# Patient Record
Sex: Female | Born: 1980
Health system: Southern US, Community
[De-identification: ages and names within clinical notes are randomized; demographics above are authoritative.]

## PROBLEM LIST (undated history)

## (undated) DIAGNOSIS — R7303 Prediabetes: Secondary | ICD-10-CM

## (undated) DIAGNOSIS — F410 Panic disorder [episodic paroxysmal anxiety] without agoraphobia: Secondary | ICD-10-CM

## (undated) DIAGNOSIS — F32A Depression, unspecified: Secondary | ICD-10-CM

## (undated) DIAGNOSIS — N809 Endometriosis, unspecified: Secondary | ICD-10-CM

## (undated) DIAGNOSIS — K219 Gastro-esophageal reflux disease without esophagitis: Secondary | ICD-10-CM

## (undated) DIAGNOSIS — R87619 Unspecified abnormal cytological findings in specimens from cervix uteri: Secondary | ICD-10-CM

## (undated) DIAGNOSIS — K55059 Acute (reversible) ischemia of intestine, part and extent unspecified: Principal | ICD-10-CM

## (undated) DIAGNOSIS — G43909 Migraine, unspecified, not intractable, without status migrainosus: Secondary | ICD-10-CM

## (undated) DIAGNOSIS — E7800 Pure hypercholesterolemia, unspecified: Secondary | ICD-10-CM

## (undated) DIAGNOSIS — G4733 Obstructive sleep apnea (adult) (pediatric): Principal | ICD-10-CM

## (undated) DIAGNOSIS — O24419 Gestational diabetes mellitus in pregnancy, unspecified control: Secondary | ICD-10-CM

## (undated) DIAGNOSIS — E282 Polycystic ovarian syndrome: Secondary | ICD-10-CM

## (undated) DIAGNOSIS — F329 Major depressive disorder, single episode, unspecified: Secondary | ICD-10-CM

## (undated) HISTORY — DX: Gestational diabetes mellitus in pregnancy, unspecified control: O24.419

## (undated) HISTORY — PX: WISDOM TOOTH EXTRACTION: SHX21

## (undated) HISTORY — PX: KNEE SURGERY: SHX244

## (undated) HISTORY — DX: Unspecified abnormal cytological findings in specimens from cervix uteri: R87.619

## (undated) HISTORY — DX: Acute (reversible) ischemia of intestine, part and extent unspecified: K55.059

## (undated) HISTORY — PX: LEEP: SHX91

## (undated) HISTORY — DX: Obstructive sleep apnea (adult) (pediatric): G47.33

## (undated) HISTORY — PX: TONSILLECTOMY: SUR1361

---
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
## 2001-01-30 ENCOUNTER — Other Ambulatory Visit: Admission: RE | Admit: 2001-01-30 | Discharge: 2001-01-30 | Payer: Self-pay | Admitting: Obstetrics and Gynecology

## 2002-08-17 ENCOUNTER — Other Ambulatory Visit: Admission: RE | Admit: 2002-08-17 | Discharge: 2002-08-17 | Payer: Self-pay | Admitting: Obstetrics and Gynecology

## 2003-06-26 DIAGNOSIS — R87619 Unspecified abnormal cytological findings in specimens from cervix uteri: Secondary | ICD-10-CM

## 2003-06-26 HISTORY — DX: Unspecified abnormal cytological findings in specimens from cervix uteri: R87.619

## 2004-08-08 ENCOUNTER — Other Ambulatory Visit: Admission: RE | Admit: 2004-08-08 | Discharge: 2004-08-08 | Payer: Self-pay | Admitting: Obstetrics and Gynecology

## 2004-10-12 ENCOUNTER — Other Ambulatory Visit: Admission: RE | Admit: 2004-10-12 | Discharge: 2004-10-12 | Payer: Self-pay | Admitting: Obstetrics and Gynecology

## 2005-10-18 ENCOUNTER — Other Ambulatory Visit: Admission: RE | Admit: 2005-10-18 | Discharge: 2005-10-18 | Payer: Self-pay | Admitting: Obstetrics and Gynecology

## 2010-04-05 ENCOUNTER — Ambulatory Visit: Payer: Self-pay | Admitting: Family Medicine

## 2010-04-05 DIAGNOSIS — S01502A Unspecified open wound of oral cavity, initial encounter: Secondary | ICD-10-CM | POA: Insufficient documentation

## 2010-04-05 DIAGNOSIS — K122 Cellulitis and abscess of mouth: Secondary | ICD-10-CM | POA: Insufficient documentation

## 2010-06-21 ENCOUNTER — Encounter: Payer: Self-pay | Admitting: Family Medicine

## 2010-07-25 NOTE — Assessment & Plan Note (Signed)
Summary: SWELLING ALONG JAWLINE LEFT SIDE/TJ   Vital Signs:  Patient Profile:   30 Years Old Female CC:      ?burn to left side/insidemouth, left cheek swelling Height:     69 inches Weight:      269 pounds O2 Sat:      99 % O2 treatment:    Room Air Temp:     99.6 degrees F oral Pulse rate:   92 / minute Resp:     16 per minute BP sitting:   117 / 79  (left arm) Cuff size:   large  Pt. in pain?   no  Vitals Entered By: Lajean Saver RN (April 05, 2010 2:56 PM)                   Prior Medication List:  No prior medications documented  Updated Prior Medication List: IBUPROFEN 400 MG TABS (IBUPROFEN)   Current Allergies: No known allergies History of Present Illness Chief Complaint: ?burn to left side/insidemouth, left cheek swelling History of Present Illness: She ate something hot about 7-10 days ago. Since then mouth has ben irritated and swelling onthe L side of mouth and face w/pain.  Current Problems: CELLULITIS/ABSCESS, MOUTH (ICD-528.3) OPEN WOUND OF MOUTH UNSPECIFIED SITE COMPLICATED (ICD-873.70)   REVIEW OF SYSTEMS Constitutional Symptoms      Denies fever, chills, night sweats, weight loss, weight gain, and fatigue.  Eyes       Denies change in vision, eye pain, eye discharge, glasses, contact lenses, and eye surgery. Ear/Nose/Throat/Mouth       Denies hearing loss/aids, change in hearing, ear pain, ear discharge, dizziness, frequent runny nose, frequent nose bleeds, sinus problems, sore throat, hoarseness, and tooth pain or bleeding.  Respiratory       Denies dry cough, productive cough, wheezing, shortness of breath, asthma, bronchitis, and emphysema/COPD.  Cardiovascular       Denies murmurs, chest pain, and tires easily with exhertion.    Gastrointestinal       Complains of diarrhea.      Denies stomach pain, nausea/vomiting, constipation, blood in bowel movements, and indigestion. Genitourniary       Denies painful urination, kidney stones,  and loss of urinary control. Neurological       Denies paralysis, seizures, and fainting/blackouts. Musculoskeletal       Complains of swelling.      Denies muscle pain, joint pain, joint stiffness, decreased range of motion, redness, muscle weakness, and gout.      Comments: left cheek Skin       Denies bruising, unusual mles/lumps or sores, and hair/skin or nail changes.  Psych       Denies mood changes, temper/anger issues, anxiety/stress, speech problems, depression, and sleep problems. Other Comments: Patient says she bit into something that was very hot about 3-4 days ago. Since she has developed "soreness with white spots" on the left inner cheek. She has also developed facial swelling on the left side.   Past History:  Social History: Last updated: 04/05/2010 Current Smoker 1/2 PPD Alcohol use-no Drug use-no  Risk Factors: Smoking Status: current (04/05/2010)  Past Medical History: Unremarkable  Past Surgical History: Tonsillectomy Bilateral knee arthroscopy  Family History: Reviewed history and no changes required.  Social History: Reviewed history and no changes required. Current Smoker 1/2 PPD Alcohol use-no Drug use-no Smoking Status:  current Drug Use:  no Physical Exam General appearance: normacephalic Skin: no obvious rashes or lesions MSE: oriented to time, place, and  person Assessment New Problems: CELLULITIS/ABSCESS, MOUTH (ICD-528.3) OPEN WOUND OF MOUTH UNSPECIFIED SITE COMPLICATED (ICD-873.70)  oral open wound    cellulitis  Plan New Medications/Changes: HYDROCODONE-ACETAMINOPHEN 5-325 MG TABS (HYDROCODONE-ACETAMINOPHEN) sig 1 by mouth q6-8hrs prn  #20 x 0, 04/05/2010, Hassan Rowan MD AUGMENTIN [phone] MG TABS (AMOXICILLIN-POT CLAVULANATE) 1 by mouth 2 times daily  #20 x 0, 04/05/2010, Hassan Rowan MD  New Orders: New Patient Level III [99203] Rocephin  250mg  [J0696] Ketorolac-Toradol 15mg  [J1885] Admin of Therapeutic Inj  intramuscular or  subcutaneous [96372] Planning Comments:   follow up[ w/oral surgeon if not better by Friday  Follow Up: Follow up in 1-2 days if no improvement  The patient and/or caregiver has been counseled thoroughly with regard to medications prescribed including dosage, schedule, interactions, rationale for use, and possible side effects and they verbalize understanding.  Diagnoses and expected course of recovery discussed and will return if not improved as expected or if the condition worsens. Patient and/or caregiver verbalized understanding.  Prescriptions: HYDROCODONE-ACETAMINOPHEN 5-325 MG TABS (HYDROCODONE-ACETAMINOPHEN) sig 1 by mouth q6-8hrs prn  #20 x 0   Entered and Authorized by:   Hassan Rowan MD   Signed by:   Hassan Rowan MD on 04/05/2010   Method used:   Printed then faxed to ...       [facility]* (retail)       [address] [place], [place]  [address]       Ph: [phone]       Fax: [phone]   RxID:   [id number] AUGMENTIN 875-125 MG TABS (AMOXICILLIN-POT CLAVULANATE) 1 by mouth 2 times daily  #20 x 0   Entered and Authorized by:   Hassan Rowan MD   Signed by:   Hassan Rowan MD on 04/05/2010   Method used:   Printed then faxed to ...       [facility]* (retail)       [address] [place], [place]  [address]       Ph: [phone]       Fax: [phone]   RxID:   [id number]   Patient Instructions: 1)  Please schedule a follow-up appointment as needed. 2)  Please schedule an appointment with your primary doctor in 2-3 days if not better or oral surgeon  3)  Tobacco is very bad for your health and your loved ones! You Should stop smoking!. 4)  Stop Smoking Tips: Choose a Quit date. Cut down before the Quit date. decide what you will do as a substitute when you feel the urge to smoke(gum,toothpick,exercise). 5)  Take your antibiotic as prescribed until ALL of it is gone, but stop if you develop a rash or swelling and contact our office as soon as  possible.  Medication Administration  Injection # 1:    Medication: Rocephin  250mg     Diagnosis: CELLULITIS/ABSCESS, MOUTH (ICD-528.3)    Route: IM    Site: RUOQ gluteus    Exp Date: 11/22/2012    Lot #: [id number]    Mfr: Sandoz    Patient tolerated injection without complications    Given by: Lajean Saver RN (April 05, 2010 4:13 PM)  Injection # 2:    Medication: Ketorolac-Toradol 15mg     Diagnosis: CELLULITIS/ABSCESS, MOUTH (ICD-528.3)    Route: IM    Site: LUOQ gluteus    Exp Date: 09/24/2011    Lot #: [id number]    Mfr:  Hospira    Comments: 30mg  given    Patient tolerated injection without complications    Given by: Lajean Saver RN (April 05, 2010 4:14 PM)  Orders Added: 1)  New Patient Level III [99203] 2)  Rocephin  250mg  [J0696] 3)  Ketorolac-Toradol 15mg  [J1885] 4)  Admin of Therapeutic Inj  intramuscular or subcutaneous [91478]

## 2010-07-27 NOTE — Letter (Signed)
Summary: RELEASE OF RECORDS  RELEASE OF RECORDS   Imported By: Dannette Barbara 06/21/2010 08:59:35  _____________________________________________________________________  External Attachment:    Type:   Image     Comment:   External Document

## 2010-11-22 ENCOUNTER — Encounter: Payer: Self-pay | Admitting: Emergency Medicine

## 2010-11-22 ENCOUNTER — Inpatient Hospital Stay (INDEPENDENT_AMBULATORY_CARE_PROVIDER_SITE_OTHER)
Admission: RE | Admit: 2010-11-22 | Discharge: 2010-11-22 | Disposition: A | Discharge status: Home / Self Care | Payer: Self-pay | Source: Ambulatory Visit | Attending: Emergency Medicine | Admitting: Emergency Medicine

## 2010-11-22 DIAGNOSIS — R05 Cough: Secondary | ICD-10-CM

## 2010-11-22 DIAGNOSIS — R059 Cough, unspecified: Secondary | ICD-10-CM

## 2010-11-22 DIAGNOSIS — J069 Acute upper respiratory infection, unspecified: Secondary | ICD-10-CM

## 2011-05-28 NOTE — Progress Notes (Signed)
Summary: SWOLLEN GLANDS,COUGH SORE THROAT/TJ Room 5   Vital Signs:  Patient Profile:   30 Years Old Female CC:      Congestion, sore throat, cough x 2 days Height:     69 inches Weight:      287 pounds O2 Sat:      97 % O2 treatment:    Room Air Temp:     98.5 degrees F oral Pulse rate:   88 / minute Pulse rhythm:   regular Resp:     18 per minute BP sitting:   134 / 84  (left arm) Cuff size:   large  Vitals Entered By: Emilio Math (Nov 22, 2010 7:41 PM)                  Current Allergies: No known allergies History of Present Illness History from: patient Chief Complaint: Congestion, sore throat, cough x 2 days History of Present Illness: 30 Years Old Female complains of onset of cold symptoms for 2 days.  Anayia has been using no OTC meds. +/- sore throat + cough No pleuritic pain No wheezing + nasal congestion + post-nasal drainage + sinus pain/pressure No chest congestion No itchy/red eyes No earache No hemoptysis No SOB + chills/sweats No fever No nausea No vomiting No abdominal pain No diarrhea No skin rashes No fatigue No myalgias No headache   REVIEW OF SYSTEMS Constitutional Symptoms      Denies fever, chills, night sweats, weight loss, weight gain, and fatigue.  Eyes       Denies change in vision, eye pain, eye discharge, glasses, contact lenses, and eye surgery. Ear/Nose/Throat/Mouth       Complains of sinus problems and sore throat.      Denies hearing loss/aids, change in hearing, ear pain, ear discharge, dizziness, frequent runny nose, frequent nose bleeds, hoarseness, and tooth pain or bleeding.  Respiratory       Complains of dry cough.      Denies productive cough, wheezing, shortness of breath, asthma, bronchitis, and emphysema/COPD.  Cardiovascular       Denies murmurs, chest pain, and tires easily with exhertion.    Gastrointestinal       Denies stomach pain, nausea/vomiting, diarrhea, constipation, blood in bowel movements,  and indigestion. Genitourniary       Denies painful urination, kidney stones, and loss of urinary control. Neurological       Denies paralysis, seizures, and fainting/blackouts. Musculoskeletal       Denies muscle pain, joint pain, joint stiffness, decreased range of motion, redness, swelling, muscle weakness, and gout.  Skin       Denies bruising, unusual mles/lumps or sores, and hair/skin or nail changes.  Psych       Denies mood changes, temper/anger issues, anxiety/stress, speech problems, depression, and sleep problems.  Past History:  Past Medical History: Reviewed history from 04/05/2010 and no changes required. Unremarkable  Past Surgical History: Reviewed history from 04/05/2010 and no changes required. 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  Plan New Medications/Changes: CHERATUSSIN AC 100-10 MG/5ML SYRP (GUAIFENESIN-CODEINE) 5cc q4-6 hrs as needed for cough  #5oz x 0, 11/22/2010, Hoyt Koch MD ZITHROMAX Z-PAK 250 MG TABS (AZITHROMYCIN) use as directed  #1 x 0, 11/22/2010, Hoyt Koch MD  New Orders: Est. Patient Level IV [14782] Pulse Oximetry (single measurment) [94760] Planning Comments:   1)  Take the prescribed antibiotic as instructed.  Likely viral URI so will probably take another 7 days to get  better no matter what treatment. 2)  Use nasal saline solution (over the counter) at least 3 times a day. 3)  Use over the counter decongestants like Zyrtec-D every 12 hours as needed to help with congestion. 4)  Can take tylenol every 6 hours or motrin every 8 hours for pain or fever. 5)  Follow up with your primary doctor  if no improvement in 5-7 days, sooner if increasing pain, fever, or new symptoms.    The patient and/or caregiver has been counseled thoroughly with regard to medications prescribed including dosage, schedule, interactions, rationale for use, and possible side effects and they verbalize understanding.  Diagnoses and expected course of recovery discussed and will return if not improved as expected or if the condition worsens. Patient and/or caregiver verbalized understanding.  Prescriptions: CHERATUSSIN AC 100-10 MG/5ML SYRP (GUAIFENESIN-CODEINE) 5cc q4-6 hrs as needed for cough  #5oz x 0   Entered and Authorized by:   Hoyt Koch MD   Signed by:   Hoyt Koch MD on 11/22/2010   Method used:   Print then Give to Patient   RxID:   [id number] ZITHROMAX Z-PAK 250 MG TABS (AZITHROMYCIN) use as directed  #1 x 0   Entered and Authorized by:   Hoyt Koch MD   Signed by:   Hoyt Koch MD on 11/22/2010   Method used:   Print then Give to Patient   RxID:   [id number]   Orders Added: 1)  Est. Patient Level IV [25366] 2)  Pulse Oximetry (single measurment) [44034]

## 2011-10-17 ENCOUNTER — Emergency Department
Admission: EM | Admit: 2011-10-17 | Discharge: 2011-10-17 | Disposition: A | Discharge status: Home / Self Care | Payer: Self-pay | Source: Home / Self Care | Attending: Family Medicine | Admitting: Family Medicine

## 2011-10-17 ENCOUNTER — Encounter: Payer: Self-pay | Admitting: Emergency Medicine

## 2011-10-17 ENCOUNTER — Emergency Department
Admit: 2011-10-17 | Discharge: 2011-10-17 | Disposition: A | Discharge status: Home / Self Care | Payer: Self-pay | Attending: Family Medicine | Admitting: Family Medicine

## 2011-10-17 DIAGNOSIS — J029 Acute pharyngitis, unspecified: Secondary | ICD-10-CM

## 2011-10-17 DIAGNOSIS — R079 Chest pain, unspecified: Secondary | ICD-10-CM

## 2011-10-17 DIAGNOSIS — R0781 Pleurodynia: Secondary | ICD-10-CM

## 2011-10-17 DIAGNOSIS — J209 Acute bronchitis, unspecified: Secondary | ICD-10-CM

## 2011-10-17 MED ORDER — CLARITHROMYCIN 500 MG PO TABS: 500.0000 mg | ORAL_TABLET | Freq: Two times a day (BID) | ORAL | Status: AC

## 2011-10-17 MED ORDER — GUAIFENESIN-CODEINE 100-10 MG/5ML PO SYRP: 10.0000 mL | ORAL_SOLUTION | Freq: Every day | ORAL | Status: AC

## 2011-10-17 NOTE — ED Provider Notes (Signed)
History     CSN: [id number]  Arrival date & time 10/17/11  1035   First MD Initiated Contact with Patient 10/17/11 1109      Chief Complaint  Patient presents with  . Cough      HPI Comments: Patient complains of mild non-productive cough for about two weeks, then over the past 3 to 4 days has had gradually progressive URI symptoms beginning with a mild sore throat (now improved), followed by progressive nasal congestion.  The cough is now worse.  Complains of fatigue but no myalgias.  Cough is now worse at night and generally non-productive during the day.  She sometimes coughs until she gags.  There has been no shortness of breath or wheezes, but over the past 3 days she has developed pleuritic pain in her right anterior/inferior chest.  She has pain with inspiration and movement.  She believes that her tetanus shot is current.  She has a past history of bronchitis and rib fracture.  She continues to smoke.  The history is provided by the patient.    History reviewed. No pertinent past medical history.  Past Surgical History  Procedure Date  . Tonsillectomy     Family History  Problem Relation Age of Onset  . Hypertension Mother   . Thyroid disease Mother   . GER disease Father     History  Substance Use Topics  . Smoking status: Current Everyday Smoker -- 1.0 packs/day for 10 years  . Smokeless tobacco: Not on file  . Alcohol Use: Yes    OB History    Grav Para Term Preterm Abortions TAB SAB Ect Mult Living                  Review of Systems + sore throat + cough + pleuritic pain on right No wheezing + nasal congestion + post-nasal drainage No sinus pain/pressure No itchy/red eyes ? earache No hemoptysis No SOB No fever/ chills No nausea No vomiting No abdominal pain No diarrhea No urinary symptoms No skin rashes + fatigue No myalgias + headache Used OTC meds without relief (Mucinex) Allergies  Review of patient's allergies indicates no known  allergies.  Home Medications   Current Outpatient Rx  Name Route Sig Dispense Refill  . DM-GUAIFENESIN ER 30-600 MG PO TB12 Oral Take 1 tablet by mouth every 12 (twelve) hours.    Marland Kitchen CLARITHROMYCIN 500 MG PO TABS Oral Take 1 tablet (500 mg total) by mouth 2 (two) times daily. 14 tablet 0  . GUAIFENESIN-CODEINE 100-10 MG/5ML PO SYRP Oral Take 10 mLs by mouth at bedtime. for cough 120 mL 0    BP 99/56  Pulse 64  Temp(Src) 98.3 F (36.8 C) (Oral)  Resp 18  Ht 5\' 9"  (1.753 m)  Wt 265 lb (120.203 kg)  BMI 39.13 kg/m2  SpO2 98%  Physical Exam Nursing notes and Vital Signs reviewed. Appearance:  Patient appears stated age, and in no acute distress.  Patient is obese (BMI 39.2) Eyes:  Pupils are equal, round, and reactive to light and accomodation.  Extraocular movement is intact.  Conjunctivae are not inflamed  Ears:  Canals normal.  Tympanic membranes normal.  Nose:  Mildly congested turbinates.  No sinus tenderness.  Pharynx:  Normal Neck:  Supple.  Tender shotty posterior nodes are palpated bilaterally  Lungs:  Clear to auscultation.  Breath sounds are equal.  Chest:  Tender right anterior/inferior ribs.  No swelling, crepitance, or ecchymosis. Heart:  Regular rate and rhythm  without murmurs, rubs, or gallops.  Abdomen:  Nontender without masses or hepatosplenomegaly.  Bowel sounds are present.  No CVA or flank tenderness.  Extremities:  No edema.  No calf tenderness Skin:  No rash present.   ED Course  Procedures  none   Labs Reviewed  POCT RAPID STREP A (OFFICE) negative   Dg Ribs Unilateral W/chest Right  10/17/2011  *RADIOLOGY REPORT*  Clinical Data: Cough.  Right-sided rib pain.  RIGHT RIBS AND CHEST - 3+ VIEW  Comparison: None.  Findings: Heart size is normal.  Mediastinal shadows are normal. The lungs are clear.  No discernible rib fractures.  Only the lower ribs were evaluated.  IMPRESSION: No active cardiopulmonary disease.  No visible rib fracture.  Original Report  Authenticated By: Thomasenia Sales, M.D.     1. Rib pain on right side   2. Acute bronchitis       MDM  Begin Biaxin, and guaifenesin/codeine at bedtime. Dispensed rib belt. Take Mucinex D (guaifenesin with decongestant) twice daily for congestion.  Increase fluid intake, rest. May use Afrin nasal spray (or generic oxymetazoline) twice daily for about 5 days.  Also recommend using saline nasal spray several times daily and saline nasal irrigation (AYR is a common brand) Stop all antihistamines for now, and other non-prescription cough/cold preparations. May take Ibuprofen 200mg , 4 tabs every 8 hours with food for chest/rib discomfort. Follow-up with family doctor if not improving 7 to 10 days.         Lattie Haw, MD 10/17/11 [phone]

## 2011-10-17 NOTE — ED Notes (Signed)
Cough, sore throat x 3 days

## 2011-10-17 NOTE — Discharge Instructions (Signed)
Take Mucinex D (guaifenesin with decongestant) twice daily for congestion.  Increase fluid intake, rest. May use Afrin nasal spray (or generic oxymetazoline) twice daily for about 5 days.  Also recommend using saline nasal spray several times daily and saline nasal irrigation (AYR is a common brand) Stop all antihistamines for now, and other non-prescription cough/cold preparations. May take Ibuprofen 200mg , 4 tabs every 8 hours with food for chest/rib discomfort. Follow-up with family doctor if not improving 7 to 10 days.

## 2012-01-02 DIAGNOSIS — F988 Other specified behavioral and emotional disorders with onset usually occurring in childhood and adolescence: Secondary | ICD-10-CM | POA: Insufficient documentation

## 2012-01-02 DIAGNOSIS — F329 Major depressive disorder, single episode, unspecified: Secondary | ICD-10-CM | POA: Insufficient documentation

## 2012-01-02 DIAGNOSIS — F419 Anxiety disorder, unspecified: Secondary | ICD-10-CM | POA: Insufficient documentation

## 2012-01-02 DIAGNOSIS — Z720 Tobacco use: Secondary | ICD-10-CM | POA: Insufficient documentation

## 2012-01-02 DIAGNOSIS — R9401 Abnormal electroencephalogram [EEG]: Secondary | ICD-10-CM | POA: Insufficient documentation

## 2012-01-04 ENCOUNTER — Emergency Department (HOSPITAL_BASED_OUTPATIENT_CLINIC_OR_DEPARTMENT_OTHER): Payer: Self-pay

## 2012-01-04 ENCOUNTER — Emergency Department (HOSPITAL_BASED_OUTPATIENT_CLINIC_OR_DEPARTMENT_OTHER)
Admission: EM | Admit: 2012-01-04 | Discharge: 2012-01-04 | Disposition: A | Discharge status: Home / Self Care | Payer: Self-pay | Attending: Emergency Medicine | Admitting: Emergency Medicine

## 2012-01-04 ENCOUNTER — Encounter (HOSPITAL_BASED_OUTPATIENT_CLINIC_OR_DEPARTMENT_OTHER): Payer: Self-pay

## 2012-01-04 DIAGNOSIS — F17200 Nicotine dependence, unspecified, uncomplicated: Secondary | ICD-10-CM | POA: Insufficient documentation

## 2012-01-04 DIAGNOSIS — F411 Generalized anxiety disorder: Secondary | ICD-10-CM | POA: Insufficient documentation

## 2012-01-04 DIAGNOSIS — I498 Other specified cardiac arrhythmias: Secondary | ICD-10-CM | POA: Insufficient documentation

## 2012-01-04 DIAGNOSIS — Z79899 Other long term (current) drug therapy: Secondary | ICD-10-CM | POA: Insufficient documentation

## 2012-01-04 DIAGNOSIS — R079 Chest pain, unspecified: Secondary | ICD-10-CM | POA: Insufficient documentation

## 2012-01-04 DIAGNOSIS — R001 Bradycardia, unspecified: Secondary | ICD-10-CM

## 2012-01-04 DIAGNOSIS — K219 Gastro-esophageal reflux disease without esophagitis: Secondary | ICD-10-CM | POA: Insufficient documentation

## 2012-01-04 HISTORY — DX: Panic disorder (episodic paroxysmal anxiety): F41.0

## 2012-01-04 HISTORY — DX: Gastro-esophageal reflux disease without esophagitis: K21.9

## 2012-01-04 HISTORY — DX: Pure hypercholesterolemia, unspecified: E78.00

## 2012-01-04 LAB — CBC WITH DIFFERENTIAL/PLATELET
Basophils Relative: 1 % (ref 0–1)
Eosinophils Absolute: 0.2 10*3/uL (ref 0.0–0.7)
Eosinophils Relative: 1 % (ref 0–5)
MCH: 31.7 pg (ref 26.0–34.0)
MCHC: 34.3 g/dL (ref 30.0–36.0)
MCV: 92.3 fL (ref 78.0–100.0)
Neutrophils Relative %: 50 % (ref 43–77)
Platelets: 250 10*3/uL (ref 150–400)

## 2012-01-04 LAB — COMPREHENSIVE METABOLIC PANEL
ALT: 16 U/L (ref 0–35)
Albumin: 3.7 g/dL (ref 3.5–5.2)
Alkaline Phosphatase: 73 U/L (ref 39–117)
BUN: 11 mg/dL (ref 6–23)
Calcium: 9.1 mg/dL (ref 8.4–10.5)
GFR calc Af Amer: >90 mL/min (ref >90)
Glucose, Bld: 86 mg/dL (ref 70–99)
Potassium: 3.8 mEq/L (ref 3.5–5.1)
Sodium: 137 mEq/L (ref 135–145)
Total Protein: 6.6 g/dL (ref 6.0–8.3)

## 2012-01-04 LAB — TROPONIN I: Troponin I: <0.3 ng/mL (ref <0.30)

## 2012-01-04 NOTE — ED Notes (Signed)
MD at bedside. 

## 2012-01-04 NOTE — ED Provider Notes (Signed)
Medical screening examination/treatment/procedure(s) were conducted as a shared visit with non-physician practitioner(s) and myself.  I personally evaluated the patient during the encounter  Atypical chest pain. No SOB. Fmhx VTE x 2 after inciting events. Dimer negative. Labs unremarkable. Sinus bradycardia on EKG without lightheadedness/dizziness. CXR unremarkable. RRR, CTAB. No EMC precluding discharge at this time. Given Precautions for return. PMD f/u.   Forbes Cellar, MD 01/04/12 2342

## 2012-01-04 NOTE — ED Notes (Signed)
Patient transported to X-ray 

## 2012-01-04 NOTE — ED Notes (Signed)
C/o CP started 7/6-states pain started after losing paycheck in [employer] and having panic attack-was seen PCP 2 days ago-dx with GERD-started on prilosec and celexa

## 2012-01-04 NOTE — ED Notes (Signed)
PA at bedside.

## 2012-01-04 NOTE — ED Provider Notes (Signed)
History     CSN: [id number]  Arrival date & time 01/04/12  1936   First MD Initiated Contact with Patient 01/04/12 2119      Chief Complaint  Patient presents with  . Chest Pain    (Consider location/radiation/quality/duration/timing/severity/associated sxs/prior treatment) Patient is a 31 y.o. female presenting with chest pain. The history is provided by the patient and a parent.  Chest Pain The chest pain began 1 - 2 weeks ago. Chest pain occurs intermittently. The chest pain is unchanged. The quality of the pain is described as sharp. The pain does not radiate. Pertinent negatives for primary symptoms include no fever, no shortness of breath, no nausea and no vomiting. Associated symptoms comments: She reports chest discomfort she describes as a pinching sensation that lasts 30 seconds to 1 minute and can occur frequently or sporadically throughout the day. She has been having pain since last week after having an anxiety attack. She has a history of anxiety and states pain of complaint is unlike those symptoms of anxiety. She has a history of GERD and reports chest discomfort is unlike symptoms of GERD. No SOB, cough, fever. No nausea, vomiting. .     Past Medical History  Diagnosis Date  . GERD (gastroesophageal reflux disease)   . Panic attack   . High cholesterol     Past Surgical History  Procedure Date  . Tonsillectomy   . Knee surgery   . Wisdom tooth extraction     Family History  Problem Relation Age of Onset  . Hypertension Mother   . Thyroid disease Mother   . GER disease Father     History  Substance Use Topics  . Smoking status: Current Everyday Smoker -- 1.0 packs/day for 10 years  . Smokeless tobacco: Not on file  . Alcohol Use: Yes    OB History    Grav Para Term Preterm Abortions TAB SAB Ect Mult Living                  Review of Systems  Constitutional: Negative for fever.  Respiratory: Negative for shortness of breath.   Cardiovascular:  Positive for chest pain. Negative for leg swelling.  Gastrointestinal: Negative for nausea and vomiting.    Allergies  Wellbutrin  Home Medications   Current Outpatient Rx  Name Route Sig Dispense Refill  . ALUM & MAG HYDROXIDE-SIMETH 200-200-20 MG/5ML PO SUSP Oral Take 10 mLs by mouth every 6 (six) hours as needed. Patient used this medication for heartburn.    Marland Kitchen CITALOPRAM HYDROBROMIDE 10 MG PO TABS Oral Take 10 mg by mouth daily.    Marland Kitchen NAPROXEN SODIUM 220 MG PO TABS Oral Take 440 mg by mouth 2 (two) times daily with a meal. Patient used this medication for pain.    Marland Kitchen OMEPRAZOLE 20 MG PO CPDR Oral Take 20 mg by mouth daily.    Marland Kitchen ZANTAC PO Oral Take 1 tablet by mouth daily as needed. Patient uses this medication for heartburn.    Marland Kitchen SIMETHICONE 125 MG PO CHEW Oral Chew 125 mg by mouth every 6 (six) hours as needed. Patient used this medication for flatulence.      BP 106/46  Pulse 43  Temp 97.9 F (36.6 C) (Oral)  Resp 13  Ht 5\' 9"  (1.753 m)  Wt 258 lb (117.028 kg)  BMI 38.10 kg/m2  SpO2 97%  LMP 12/31/2011  Physical Exam  Constitutional: She is oriented to person, place, and time. She appears well-developed and well-nourished.  No distress.  Cardiovascular: Normal rate and regular rhythm.   No murmur heard. Pulmonary/Chest: Effort normal and breath sounds normal. She has no wheezes. She has no rales.  Abdominal: Soft. There is no tenderness. There is no rebound and no guarding.  Musculoskeletal: She exhibits no edema.  Neurological: She is alert and oriented to person, place, and time.    ED Course  Procedures (including critical care time)  Labs Reviewed  CBC WITH DIFFERENTIAL - Abnormal; Notable for the following:    WBC 13.6 (*)     Lymphs Abs 5.6 (*)     All other components within normal limits  COMPREHENSIVE METABOLIC PANEL - Abnormal; Notable for the following:    Total Bilirubin 0.2 (*)     All other components within normal limits  TROPONIN I   Dg Chest 2  View  01/04/2012  *RADIOLOGY REPORT*  Clinical Data: Chest pain  CHEST - 2 VIEW  Comparison: 10/17/2011  Findings: Mildly prominent pulmonary vasculature, may be accentuated by hypoaeration. Heart size within normal limits. Mild peribronchial cuffing.  The No focal consolidation, pleural effusion, or pneumothorax.  No acute osseous finding.  IMPRESSION: Mildly prominent pulmonary vasculature is nonspecific.  Mild peribronchial cuffing can be seen with bronchitis or mild edema.  No focal consolidation.  Original Report Authenticated By: Waneta Martins, M.D.   Results for orders placed during the hospital encounter of 01/04/12  CBC WITH DIFFERENTIAL      Component Value Range   WBC 13.6 (*) 4.0 - 10.5 K/uL   RBC 4.39  3.87 - 5.11 MIL/uL   Hemoglobin 13.9  12.0 - 15.0 g/dL   HCT 78.4  69.6 - 29.5 %   MCV 92.3  78.0 - 100.0 fL   MCH 31.7  26.0 - 34.0 pg   MCHC 34.3  30.0 - 36.0 g/dL   RDW 28.4  13.2 - 44.0 %   Platelets 250  150 - 400 K/uL   Neutrophils Relative 50  43 - 77 %   Neutro Abs 6.8  1.7 - 7.7 K/uL   Lymphocytes Relative 41  12 - 46 %   Lymphs Abs 5.6 (*) 0.7 - 4.0 K/uL   Monocytes Relative 7  3 - 12 %   Monocytes Absolute 1.0  0.1 - 1.0 K/uL   Eosinophils Relative 1  0 - 5 %   Eosinophils Absolute 0.2  0.0 - 0.7 K/uL   Basophils Relative 1  0 - 1 %   Basophils Absolute 0.1  0.0 - 0.1 K/uL  COMPREHENSIVE METABOLIC PANEL      Component Value Range   Sodium 137  135 - 145 mEq/L   Potassium 3.8  3.5 - 5.1 mEq/L   Chloride 104  96 - 112 mEq/L   CO2 24  19 - 32 mEq/L   Glucose, Bld 86  70 - 99 mg/dL   BUN 11  6 - 23 mg/dL   Creatinine, Ser 1.02  0.50 - 1.10 mg/dL   Calcium 9.1  8.4 - 72.5 mg/dL   Total Protein 6.6  6.0 - 8.3 g/dL   Albumin 3.7  3.5 - 5.2 g/dL   AST 14  0 - 37 U/L   ALT 16  0 - 35 U/L   Alkaline Phosphatase 73  39 - 117 U/L   Total Bilirubin 0.2 (*) 0.3 - 1.2 mg/dL   GFR calc non Af Amer >90  >90 mL/min   GFR calc Af Amer >90  >90 mL/min  TROPONIN I       Component Value Range   Troponin I <0.30  <0.30 ng/mL      No diagnosis found. 1. Nonspecific chest pain    MDM   Date: 01/04/2012  Rate: 62  Rhythm: normal sinus rhythm and sinus arrhythmia  QRS Axis: normal  Intervals: normal  ST/T Wave abnormalities: normal  Conduction Disutrbances:none  Narrative Interpretation:   Old EKG Reviewed: none available  She remains with a heart rate in the 40's on the monitor. Blood tests essentially negative, normal troponin, CXR unremarkable without infiltrates. Repeat EKG is bradycardic but unremarkable. Low risk for PE and not symptoms c/w PE present. Doubt coronary syndrome given age and normal evaluation. Will discharge home with primary care follow up.          Rodena Medin, PA-C 01/04/12 2224

## 2012-09-25 ENCOUNTER — Emergency Department (INDEPENDENT_AMBULATORY_CARE_PROVIDER_SITE_OTHER): Payer: BC Managed Care – PPO

## 2012-09-25 ENCOUNTER — Emergency Department (INDEPENDENT_AMBULATORY_CARE_PROVIDER_SITE_OTHER)
Admission: EM | Admit: 2012-09-25 | Discharge: 2012-09-25 | Disposition: A | Discharge status: Home / Self Care | Payer: BC Managed Care – PPO | Source: Home / Self Care | Attending: Family Medicine | Admitting: Family Medicine

## 2012-09-25 ENCOUNTER — Encounter: Payer: Self-pay | Admitting: *Deleted

## 2012-09-25 DIAGNOSIS — S20219A Contusion of unspecified front wall of thorax, initial encounter: Secondary | ICD-10-CM

## 2012-09-25 DIAGNOSIS — S20211A Contusion of right front wall of thorax, initial encounter: Secondary | ICD-10-CM

## 2012-09-25 DIAGNOSIS — W010XXA Fall on same level from slipping, tripping and stumbling without subsequent striking against object, initial encounter: Secondary | ICD-10-CM

## 2012-09-25 DIAGNOSIS — R079 Chest pain, unspecified: Secondary | ICD-10-CM

## 2012-09-25 MED ORDER — HYDROCODONE-ACETAMINOPHEN 5-325 MG PO TABS: ORAL_TABLET | ORAL | Status: DC

## 2012-09-25 NOTE — ED Provider Notes (Signed)
History     CSN: [id number]  Arrival date & time 09/25/12  1527   First MD Initiated Contact with Patient 09/25/12 1549      Chief Complaint  Patient presents with  . Back Pain      HPI Comments: Patient slipped in her bathtub about 10 days ago, striking her right posterior chest.  She has had persistent pain with movement .  No shortness of breath   Patient is a 32 y.o. female presenting with chest pain. The history is provided by the patient.  Chest Pain Chest pain location: right posterior chest. Pain quality: sharp   Pain radiates to:  Does not radiate Pain severity:  Moderate Onset quality:  Sudden Duration:  10 days Timing:  Constant Progression:  Unchanged Chronicity:  New Context: breathing, lifting, at rest and trauma   Context: not eating and not raising an arm   Relieved by:  Nothing Associated symptoms: back pain   Associated symptoms: no abdominal pain, no cough, no diaphoresis, no dizziness, no dysphagia, no fatigue, no fever, no headache, no nausea, no shortness of breath and not vomiting   Risk factors: obesity     Past Medical History  Diagnosis Date  . GERD (gastroesophageal reflux disease)   . Panic attack   . High cholesterol     Past Surgical History  Procedure Laterality Date  . Tonsillectomy    . Knee surgery    . Wisdom tooth extraction      Family History  Problem Relation Age of Onset  . Hypertension Mother   . Thyroid disease Mother   . GER disease Father   . Thyroid disease Brother     History  Substance Use Topics  . Smoking status: Current Every Day Smoker -- 0.50 packs/day for 14 years    Types: Cigarettes  . Smokeless tobacco: Not on file  . Alcohol Use: Yes    OB History   Grav Para Term Preterm Abortions TAB SAB Ect Mult Living                  Review of Systems  Constitutional: Negative for fever, diaphoresis and fatigue.  HENT: Negative for trouble swallowing.   Respiratory: Negative for cough and shortness of  breath.   Cardiovascular: Positive for chest pain.  Gastrointestinal: Negative for nausea, vomiting and abdominal pain.  Musculoskeletal: Positive for back pain.  Neurological: Negative for dizziness and headaches.    Allergies  Wellbutrin  Home Medications   Current Outpatient Rx  Name  Route  Sig  Dispense  Refill  . alum & mag hydroxide-simeth (MAALOX/MYLANTA) 200-200-20 MG/5ML suspension   Oral   Take 10 mLs by mouth every 6 (six) hours as needed. Patient used this medication for heartburn.         Marland Kitchen HYDROcodone-acetaminophen (NORCO/VICODIN) 5-325 MG per tablet      Take one by mouth at bedtime as needed for pain   10 tablet   0   . naproxen sodium (ANAPROX) 220 MG tablet   Oral   Take 440 mg by mouth 2 (two) times daily with a meal. Patient used this medication for pain.         Marland Kitchen omeprazole (PRILOSEC) 20 MG capsule   Oral   Take 20 mg by mouth daily.         . Ranitidine HCl (ZANTAC PO)   Oral   Take 1 tablet by mouth daily as needed. Patient uses this medication for heartburn.         Marland Kitchen  simethicone (MYLICON) 125 MG chewable tablet   Oral   Chew 125 mg by mouth every 6 (six) hours as needed. Patient used this medication for flatulence.           BP 116/75  Pulse 78  Ht 5' 9.25" (1.759 m)  Wt 271 lb (122.925 kg)  BMI 39.73 kg/m2  SpO2 98%  LMP 09/10/2012  Physical Exam  Nursing note and vitals reviewed. Constitutional: She is oriented to person, place, and time. She appears well-developed and well-nourished. No distress.  Patient is obese (BMI 39.7)  HENT:  Head: Atraumatic.  Eyes: Conjunctivae are normal. Pupils are equal, round, and reactive to light.  Cardiovascular: Normal heart sounds.   Pulmonary/Chest: Effort normal and breath sounds normal. No accessory muscle usage. Not tachypneic. No respiratory distress.   She exhibits tenderness.  There is mild tenderness over the right posterior paraspinous muscles and ribs as noted.  No  ecchymosis or swelling.  No crepitance  Abdominal: There is no tenderness.  Neurological: She is alert and oriented to person, place, and time.  Skin: Skin is warm and dry. No rash noted.    ED Course  Procedures  none   Dg Ribs Unilateral W/chest Right  09/25/2012  *RADIOLOGY REPORT*  Clinical Data: Larey Seat 10 days ago  RIGHT RIBS AND CHEST - 3+ VIEW  Comparison: 01/04/2012  Findings: Lungs are clear.  Negative for infiltrate or effusion. No pneumothorax.  Negative for right lower rib fracture.  IMPRESSION: Negative   Original Report Authenticated By: Janeece Riggers, M.D.      1. Contusion of ribs, right, initial encounter       MDM   Wear rib belt as needed.  Apply heating pad several times daily.  Take Aleve, two tabs every 12 hours with food. Followup with [facility] if not improving about two weeks.         Lattie Haw, MD 09/29/12 [phone]

## 2012-09-25 NOTE — ED Notes (Signed)
Ariana Parks c/o mid/lower back pain x 1 week after falling in her bathtub landing against a soap rack. Taken IBF with mild relief.

## 2012-11-14 ENCOUNTER — Encounter: Payer: Self-pay | Admitting: *Deleted

## 2012-11-14 ENCOUNTER — Emergency Department (INDEPENDENT_AMBULATORY_CARE_PROVIDER_SITE_OTHER)
Admission: EM | Admit: 2012-11-14 | Discharge: 2012-11-14 | Disposition: A | Discharge status: Home / Self Care | Payer: BC Managed Care – PPO | Source: Home / Self Care | Attending: Family Medicine | Admitting: Family Medicine

## 2012-11-14 DIAGNOSIS — IMO0002 Reserved for concepts with insufficient information to code with codable children: Secondary | ICD-10-CM

## 2012-11-14 DIAGNOSIS — L089 Local infection of the skin and subcutaneous tissue, unspecified: Secondary | ICD-10-CM

## 2012-11-14 HISTORY — DX: Major depressive disorder, single episode, unspecified: F32.9

## 2012-11-14 HISTORY — DX: Depression, unspecified: F32.A

## 2012-11-14 MED ORDER — CEPHALEXIN 500 MG PO CAPS: 500.0000 mg | ORAL_CAPSULE | Freq: Four times a day (QID) | ORAL | Status: DC

## 2012-11-14 NOTE — ED Notes (Signed)
Pt c/o a laceration to the bottom of her LT foot x 3 days ago. She reports having a Tdap 11/13'. She has cleaned it with peroxide and applied neosporin daily.

## 2012-11-14 NOTE — ED Provider Notes (Signed)
History     CSN: [id number]  Arrival date & time 11/14/12  1843   First MD Initiated Contact with Patient 11/14/12 1910      Chief Complaint  Patient presents with  . Foot Injury      HPI Comments: Patient complains of a laceration to the plantar surface of her left foot 3 days ago.  She has cleaned the injury daily and applied Neosporin.  She has had persistent tenderness at the site.  Tdap is current.  Patient is a 32 y.o. female presenting with foot injury. The history is provided by the patient.  Foot Injury Location:  Foot Time since incident:  3 days Injury: yes   Mechanism of injury comment:  Laceration Foot location:  L foot Pain details:    Quality:  Aching   Radiates to:  Does not radiate   Severity:  Mild   Onset quality:  Sudden   Timing:  Constant   Progression:  Unchanged Chronicity:  New Foreign body present:  No foreign bodies Tetanus status:  Up to date Prior injury to area:  No Relieved by:  Nothing Associated symptoms: no decreased ROM, no fever, no numbness, no swelling and no tingling     Past Medical History  Diagnosis Date  . GERD (gastroesophageal reflux disease)   . Panic attack   . High cholesterol   . Depression     Past Surgical History  Procedure Laterality Date  . Tonsillectomy    . Knee surgery    . Wisdom tooth extraction      Family History  Problem Relation Age of Onset  . Hypertension Mother   . Thyroid disease Mother   . GER disease Father   . Thyroid disease Brother     History  Substance Use Topics  . Smoking status: Current Every Day Smoker -- 0.50 packs/day for 14 years    Types: Cigarettes  . Smokeless tobacco: Not on file     Comment: using vapes  . Alcohol Use: Yes    OB History   Grav Para Term Preterm Abortions TAB SAB Ect Mult Living                  Review of Systems  Constitutional: Negative for fever.  All other systems reviewed and are negative.    Allergies  Wellbutrin  Home  Medications   Current Outpatient Rx  Name  Route  Sig  Dispense  Refill  . alum & mag hydroxide-simeth (MAALOX/MYLANTA) 200-200-20 MG/5ML suspension   Oral   Take 10 mLs by mouth every 6 (six) hours as needed. Patient used this medication for heartburn.         . cephALEXin (KEFLEX) 500 MG capsule   Oral   Take 1 capsule (500 mg total) by mouth 4 (four) times daily.   30 capsule   0   . HYDROcodone-acetaminophen (NORCO/VICODIN) 5-325 MG per tablet      Take one by mouth at bedtime as needed for pain   10 tablet   0   . naproxen sodium (ANAPROX) 220 MG tablet   Oral   Take 440 mg by mouth 2 (two) times daily with a meal. Patient used this medication for pain.         Marland Kitchen omeprazole (PRILOSEC) 20 MG capsule   Oral   Take 20 mg by mouth daily.         . Ranitidine HCl (ZANTAC PO)   Oral   Take 1  tablet by mouth daily as needed. Patient uses this medication for heartburn.         . simethicone (MYLICON) 125 MG chewable tablet   Oral   Chew 125 mg by mouth every 6 (six) hours as needed. Patient used this medication for flatulence.           BP 107/72  Pulse 83  Temp(Src) 98.3 F (36.8 C) (Oral)  Resp 16  Ht 5' 9.25" (1.759 m)  Wt 259 lb (117.482 kg)  BMI 37.97 kg/m2  SpO2 97%  LMP 11/14/2012  Physical Exam  Nursing note and vitals reviewed. Constitutional: She is oriented to person, place, and time. She appears well-developed and well-nourished. No distress.  Patient is obese (BMI 38.0)  Eyes: Conjunctivae are normal. Pupils are equal, round, and reactive to light.  Musculoskeletal: She exhibits tenderness.       Left foot: She exhibits tenderness and laceration. She exhibits normal range of motion, no bony tenderness, no swelling, normal capillary refill, no crepitus and no deformity.  The mid-plantar surface of left foot reveals an abrasion or puncture wound about 8mm by 1.5cm.  No purulent drainage from wound.  There is no swelling or erythema but area  surrounding wound is tender to palpation.  Distal neurovascular function is intact.   Neurological: She is alert and oriented to person, place, and time.  Skin: Skin is warm and dry. No erythema.    ED Course  Procedures  none  Labs Reviewed  WOUND CULTURE pending      1. Infected abrasion of foot, left, initial encounter       MDM  Wound culture pending Begin Keflex. Change dressing daily and apply antibiotic ointment.  Begin warm soaks twice daily until improved.  May take Ibuprofen 200mg , 4 tabs every 8 hours with food. Followup with Family Doctor if not improved in one week.         Lattie Haw, MD 11/20/12 [phone]

## 2012-11-18 LAB — WOUND CULTURE
Gram Stain: NONE SEEN
Gram Stain: NONE SEEN
Gram Stain: NONE SEEN

## 2013-03-31 ENCOUNTER — Emergency Department (INDEPENDENT_AMBULATORY_CARE_PROVIDER_SITE_OTHER)
Admission: EM | Admit: 2013-03-31 | Discharge: 2013-03-31 | Disposition: A | Discharge status: Home / Self Care | Payer: BC Managed Care – PPO | Source: Home / Self Care | Attending: Family Medicine | Admitting: Family Medicine

## 2013-03-31 ENCOUNTER — Encounter: Payer: Self-pay | Admitting: Emergency Medicine

## 2013-03-31 DIAGNOSIS — J069 Acute upper respiratory infection, unspecified: Secondary | ICD-10-CM

## 2013-03-31 DIAGNOSIS — K219 Gastro-esophageal reflux disease without esophagitis: Secondary | ICD-10-CM

## 2013-03-31 MED ORDER — GUAIFENESIN-CODEINE 100-10 MG/5ML PO SYRP: ORAL_SOLUTION | ORAL | Status: DC

## 2013-03-31 MED ORDER — CEFDINIR 300 MG PO CAPS: 300.0000 mg | ORAL_CAPSULE | Freq: Two times a day (BID) | ORAL | Status: DC

## 2013-03-31 NOTE — ED Notes (Signed)
Dry cough, congestion x 5 days 

## 2013-03-31 NOTE — ED Provider Notes (Addendum)
CSN: [id number]     Arrival date & time 03/31/13  1920 History   First MD Initiated Contact with Patient 03/31/13 1937     Chief Complaint  Patient presents with  . Cough      HPI Comments: Patient complains of a persistent non-productive cough for at least two months.  The cough is usually worse upon awakening, improving after about 30 minutes.  She notes that she often has phlegm in her mouth/throat upon awakening.  She has a history of GERD and takes Zantac daily. During the past 4 days her cough has become worse, especially when in bed at night.  She has developed a sore throat, fatigue, and increased nasal discharge.  Her cough has not responded to Mucinex and Tussin.  No fevers, chills, and sweats.  Her mother states that she has a new bottle of Prevacid 30mg  which she does not need.   The history is provided by the patient and a parent.    Past Medical History  Diagnosis Date  . GERD (gastroesophageal reflux disease)   . Panic attack   . High cholesterol   . Depression    Past Surgical History  Procedure Laterality Date  . Tonsillectomy    . Knee surgery    . Wisdom tooth extraction     Family History  Problem Relation Age of Onset  . Hypertension Mother   . Thyroid disease Mother   . GER disease Father   . Thyroid disease Brother    History  Substance Use Topics  . Smoking status: Current Every Day Smoker -- 0.50 packs/day for 16 years    Types: Cigarettes  . Smokeless tobacco: Not on file     Comment: using vapes  . Alcohol Use: Yes   OB History   Grav Para Term Preterm Abortions TAB SAB Ect Mult Living                 Review of Systems + sore throat + cough No pleuritic pain No wheezing + nasal congestion + post-nasal drainage No sinus pain/pressure No itchy/red eyes No earache No hemoptysis No SOB No fever/chills No nausea; + reflux symptoms No vomiting No abdominal pain No diarrhea No urinary symptoms No skin rashes + fatigue +  myalgias + headache Used OTC meds without relief  Allergies  Wellbutrin  Home Medications   Current Outpatient Rx  Name  Route  Sig  Dispense  Refill  . alum & mag hydroxide-simeth (MAALOX/MYLANTA) 200-200-20 MG/5ML suspension   Oral   Take 10 mLs by mouth every 6 (six) hours as needed. Patient used this medication for heartburn.         . cefdinir (OMNICEF) 300 MG capsule   Oral   Take 1 capsule (300 mg total) by mouth 2 (two) times daily.   20 capsule   0   . guaiFENesin-codeine (GUAIFENESIN AC) 100-10 MG/5ML syrup      Take 10mL by mouth at bedtime as needed for cough   120 mL   0   . naproxen sodium (ANAPROX) 220 MG tablet   Oral   Take 440 mg by mouth 2 (two) times daily with a meal. Patient used this medication for pain.         Marland Kitchen omeprazole (PRILOSEC) 20 MG capsule   Oral   Take 20 mg by mouth daily.         . Ranitidine HCl (ZANTAC PO)   Oral   Take 1 tablet by mouth  daily as needed. Patient uses this medication for heartburn.         . simethicone (MYLICON) 125 MG chewable tablet   Oral   Chew 125 mg by mouth every 6 (six) hours as needed. Patient used this medication for flatulence.          BP 121/77  Pulse 69  Temp(Src) 97.8 F (36.6 C) (Oral)  Ht 5\' 9"  (1.753 m)  Wt 285 lb (129.275 kg)  BMI 42.07 kg/m2  SpO2 99% Physical Exam Nursing notes and Vital Signs reviewed. Appearance:  Patient appears stated age, and in no acute distress.  Patient is obese (BMI 42.1) Eyes:  Pupils are equal, round, and reactive to light and accomodation.  Extraocular movement is intact.  Conjunctivae are not inflamed  Ears:  Canals normal.  Tympanic membranes normal.  Nose:  Mildly congested turbinates.  No sinus tenderness.    Pharynx:  Normal Neck:  Supple.  Slightly tender shotty posterior nodes are palpated bilaterally  Lungs:  Clear to auscultation.  Breath sounds are equal.  Heart:  Regular rate and rhythm without murmurs, rubs, or gallops.  Abdomen:   Nontender without masses or hepatosplenomegaly.  Bowel sounds are present.  No CVA or flank tenderness.  Extremities:  No edema.  No calf tenderness Skin:  No rash present.    ED Course  Procedures  none    Labs Reviewed  POCT RAPID STREP A (OFFICE) negative       MDM   1. Acute upper respiratory infections of unspecified site; suspect that patient's chronic 3 month history of increased cough is a result of GERD.  Chart reviewed and negative chest X-ray noted on 09/25/12   2. GERD (gastroesophageal reflux disease), exacerbated by obesity    Will begin Omnicef to cover possible bronchitis.  Robitussin AC at bedtime. Take plain Mucinex (guaifenesin) twice daily for cough and congestion.  May add Sudafed for sinus congestion.  Increase fluid intake, rest. May use Afrin nasal spray (or generic oxymetazoline) twice daily for about 5 days.  Also recommend using saline nasal spray several times daily and saline nasal irrigation (AYR is a common brand) Stop all antihistamines for now, and other non-prescription cough/cold preparations. Begin taking Prevacid 30mg  about 30 minutes before supper (patient's mother states that she has an unused prescription for 60 tabs). Discussed reflux precautions. Follow-up with family doctor if not improving 7 to 10 days Follow-up with family doctor in one month for GERD management.  Lattie Haw, MD 04/01/13 1056  Lattie Haw, MD 04/01/13 1058

## 2013-04-29 ENCOUNTER — Emergency Department (INDEPENDENT_AMBULATORY_CARE_PROVIDER_SITE_OTHER)
Admission: EM | Admit: 2013-04-29 | Discharge: 2013-04-29 | Disposition: A | Discharge status: Home / Self Care | Payer: BC Managed Care – PPO | Source: Home / Self Care | Attending: Family Medicine | Admitting: Family Medicine

## 2013-04-29 ENCOUNTER — Encounter: Payer: Self-pay | Admitting: Emergency Medicine

## 2013-04-29 ENCOUNTER — Emergency Department (INDEPENDENT_AMBULATORY_CARE_PROVIDER_SITE_OTHER): Payer: BC Managed Care – PPO

## 2013-04-29 DIAGNOSIS — R209 Unspecified disturbances of skin sensation: Secondary | ICD-10-CM

## 2013-04-29 DIAGNOSIS — S6000XA Contusion of unspecified finger without damage to nail, initial encounter: Secondary | ICD-10-CM

## 2013-04-29 DIAGNOSIS — S60011A Contusion of right thumb without damage to nail, initial encounter: Secondary | ICD-10-CM

## 2013-04-29 MED ORDER — HYDROCODONE-ACETAMINOPHEN 5-325 MG PO TABS: ORAL_TABLET | ORAL | Status: DC

## 2013-04-29 NOTE — ED Provider Notes (Signed)
CSN: [id number]     Arrival date & time 04/29/13  1907 History   First MD Initiated Contact with Patient 04/29/13 1928     Chief Complaint  Patient presents with  . Finger Injury    right thumb      HPI Comments: Patient closed a car door on the tip of her right thumb about one hour ago.  Patient is a 32 y.o. female presenting with hand injury. The history is provided by the patient.  Hand Injury Location:  Finger Time since incident:  1 hour Injury: yes   Mechanism of injury: crush   Crush injury:    Mechanism:  Door Finger location:  R thumb Pain details:    Quality:  Aching   Radiates to:  Does not radiate   Severity:  Moderate   Onset quality:  Sudden   Duration:  1 hour   Timing:  Constant   Progression:  Unchanged Chronicity:  New Dislocation: no   Foreign body present:  No foreign bodies Prior injury to area:  No Relieved by:  Nothing Worsened by:  Movement Ineffective treatments:  Ice Associated symptoms: decreased range of motion, stiffness and swelling   Associated symptoms: no numbness and no tingling     Past Medical History  Diagnosis Date  . GERD (gastroesophageal reflux disease)   . Panic attack   . High cholesterol   . Depression    Past Surgical History  Procedure Laterality Date  . Tonsillectomy    . Knee surgery    . Wisdom tooth extraction     Family History  Problem Relation Age of Onset  . Hypertension Mother   . Thyroid disease Mother   . GER disease Father   . Thyroid disease Brother    History  Substance Use Topics  . Smoking status: Current Every Day Smoker -- 0.50 packs/day for 16 years    Types: Cigarettes  . Smokeless tobacco: Never Used     Comment: using vapes  . Alcohol Use: Yes   OB History   Grav Para Term Preterm Abortions TAB SAB Ect Mult Living                 Review of Systems  Musculoskeletal: Positive for stiffness.  All other systems reviewed and are negative.    Allergies  Wellbutrin and  Flexeril  Home Medications   Current Outpatient Rx  Name  Route  Sig  Dispense  Refill  . alum & mag hydroxide-simeth (MAALOX/MYLANTA) 200-200-20 MG/5ML suspension   Oral   Take 10 mLs by mouth every 6 (six) hours as needed. Patient used this medication for heartburn.         Marland Kitchen HYDROcodone-acetaminophen (NORCO/VICODIN) 5-325 MG per tablet      Take one by mouth at bedtime as needed for pain   10 tablet   0   . naproxen sodium (ANAPROX) 220 MG tablet   Oral   Take 440 mg by mouth 2 (two) times daily with a meal. Patient used this medication for pain.         Marland Kitchen omeprazole (PRILOSEC) 20 MG capsule   Oral   Take 20 mg by mouth daily.         . Ranitidine HCl (ZANTAC PO)   Oral   Take 1 tablet by mouth daily as needed. Patient uses this medication for heartburn.         . simethicone (MYLICON) 125 MG chewable tablet   Oral  Chew 125 mg by mouth every 6 (six) hours as needed. Patient used this medication for flatulence.          BP 122/78  Pulse 72  Temp(Src) 97.9 F (36.6 C) (Oral)  Ht 5\' 9"  (1.753 m)  Wt 288 lb (130.636 kg)  BMI 42.51 kg/m2  SpO2 95%  LMP 04/05/2013 Physical Exam  Nursing note and vitals reviewed. Constitutional: She is oriented to person, place, and time. She appears well-developed and well-nourished. No distress.  HENT:  Head: Atraumatic.  Eyes: Conjunctivae are normal. Pupils are equal, round, and reactive to light.  Musculoskeletal:       Right hand: She exhibits decreased range of motion, tenderness, bony tenderness and swelling. She exhibits normal two-point discrimination, normal capillary refill, no deformity and no laceration. Normal sensation noted. Decreased strength noted.       Hands: Right thumb has tenderness over DIP joint and distal phalanx.  Although range of motion at DIP joint is decreased, flexion/extension is intact.  Neurological: She is alert and oriented to person, place, and time.  Skin: Skin is warm and dry.     ED Course  Procedures  none    Imaging Review Dg Finger Thumb Right  04/29/2013   CLINICAL DATA:  Right thumb injury  EXAM: RIGHT THUMB 2+V  COMPARISON:  None.  FINDINGS: There is no evidence of fracture or dislocation. There is no evidence of arthropathy or other focal bone abnormality. Soft tissues are unremarkable  IMPRESSION: Negative.   Electronically Signed   By: Natasha Mead M.D.   On: 04/29/2013 20:01      MDM   1. Contusion of right thumb, initial encounter    Finger splint applied.  Rx for Lortab at bedtime. Apply ice pack 3 to 4 times daily until swelling decreases.  Wear splint until pain has decreased.  May take Ibuprofen as needed. Followup with [facility] if not improving about two weeks.     Lattie Haw, MD 04/29/13 2020

## 2013-04-29 NOTE — ED Notes (Signed)
Closed right thumb in car door 1 hour ago.

## 2013-07-07 ENCOUNTER — Emergency Department (INDEPENDENT_AMBULATORY_CARE_PROVIDER_SITE_OTHER)
Admission: EM | Admit: 2013-07-07 | Discharge: 2013-07-07 | Disposition: A | Discharge status: Home / Self Care | Payer: BC Managed Care – PPO | Source: Home / Self Care | Attending: Emergency Medicine | Admitting: Emergency Medicine

## 2013-07-07 ENCOUNTER — Encounter: Payer: Self-pay | Admitting: Emergency Medicine

## 2013-07-07 DIAGNOSIS — J0100 Acute maxillary sinusitis, unspecified: Secondary | ICD-10-CM

## 2013-07-07 DIAGNOSIS — H6690 Otitis media, unspecified, unspecified ear: Secondary | ICD-10-CM

## 2013-07-07 DIAGNOSIS — H6692 Otitis media, unspecified, left ear: Secondary | ICD-10-CM

## 2013-07-07 MED ORDER — AMOXICILLIN 875 MG PO TABS: 875.0000 mg | ORAL_TABLET | Freq: Two times a day (BID) | ORAL | Status: DC

## 2013-07-07 MED ORDER — HYDROCODONE-HOMATROPINE 5-1.5 MG/5ML PO SYRP: 5.0000 mL | ORAL_SOLUTION | Freq: Four times a day (QID) | ORAL | Status: DC | PRN

## 2013-07-07 NOTE — ED Notes (Signed)
Pt c/o nasal congestion, LT ear popping with some pain, dizziness, sinus HA, and post nasal drip x 10 days. Denies fever. She has taken Robt CF and Mucinex.

## 2013-07-07 NOTE — ED Provider Notes (Signed)
CSN: [id number]     Arrival date & time 07/07/13  1806 History   First MD Initiated Contact with Patient 07/07/13 1822     Chief Complaint  Patient presents with  . Nasal Congestion  . Otalgia  . Headache   (Consider location/radiation/quality/duration/timing/severity/associated sxs/prior Treatment) HPI Ariana Parks is a 33 y.o. female who complains of onset of cold symptoms for 10 days.  The symptoms are constant and mild-moderate in severity.  She has been taking some over-the-counter cough and cold medicines which have helped a little bit.   + cough No pleuritic pain No wheezing + nasal congestion + post-nasal drainage + sinus pain/pressure No chest congestion No itchy/red eyes + earache No hemoptysis No SOB No chills/sweats No fever + Mild dizziness No nausea No vomiting No abdominal pain No diarrhea No skin rashes No fatigue No myalgias + headache     Past Medical History  Diagnosis Date  . GERD (gastroesophageal reflux disease)   . Panic attack   . High cholesterol   . Depression    Past Surgical History  Procedure Laterality Date  . Tonsillectomy    . Knee surgery    . Wisdom tooth extraction     Family History  Problem Relation Age of Onset  . Hypertension Mother   . Thyroid disease Mother   . GER disease Father   . Migraines Father   . Thyroid disease Brother    History  Substance Use Topics  . Smoking status: Current Every Day Smoker -- 1.00 packs/day for 16 years    Types: Cigarettes  . Smokeless tobacco: Never Used     Comment: using vapes  . Alcohol Use: No   OB History   Grav Para Term Preterm Abortions TAB SAB Ect Mult Living                 Review of Systems  All other systems reviewed and are negative.    Allergies  Celexa; Wellbutrin; and Flexeril  Home Medications   Current Outpatient Rx  Name  Route  Sig  Dispense  Refill  . alum & mag hydroxide-simeth (MAALOX/MYLANTA) 200-200-20 MG/5ML suspension   Oral   Take 10 mLs  by mouth every 6 (six) hours as needed. Patient used this medication for heartburn.         Marland Kitchen amoxicillin (AMOXIL) 875 MG tablet   Oral   Take 1 tablet (875 mg total) by mouth 2 (two) times daily.   16 tablet   0   . HYDROcodone-acetaminophen (NORCO/VICODIN) 5-325 MG per tablet      Take one by mouth at bedtime as needed for pain   10 tablet   0   . HYDROcodone-homatropine (HYCODAN) 5-1.5 MG/5ML syrup   Oral   Take 5 mLs by mouth every 6 (six) hours as needed for cough.   120 mL   0   . naproxen sodium (ANAPROX) 220 MG tablet   Oral   Take 440 mg by mouth 2 (two) times daily with a meal. Patient used this medication for pain.         Marland Kitchen omeprazole (PRILOSEC) 20 MG capsule   Oral   Take 20 mg by mouth daily.         . Ranitidine HCl (ZANTAC PO)   Oral   Take 1 tablet by mouth daily as needed. Patient uses this medication for heartburn.         . simethicone (MYLICON) 409 MG chewable tablet   Oral  Chew 125 mg by mouth every 6 (six) hours as needed. Patient used this medication for flatulence.          BP 100/73  Pulse 90  Temp(Src) 98.2 F (36.8 C) (Oral)  Resp 18  Ht 5' 8.75" (1.746 m)  Wt 283 lb (128.368 kg)  BMI 42.11 kg/m2  SpO2 98% Physical Exam  Nursing note and vitals reviewed. Constitutional: She is oriented to person, place, and time. She appears well-developed and well-nourished.  HENT:  Head: Normocephalic and atraumatic.  Right Ear: Tympanic membrane, external ear and ear canal normal. Tympanic membrane is not erythematous.  Left Ear: External ear and ear canal normal. Tympanic membrane is erythematous.  Nose: Mucosal edema and rhinorrhea present.  Mouth/Throat: Posterior oropharyngeal erythema present. No oropharyngeal exudate or posterior oropharyngeal edema.  Mild left maxillary tenderness  Eyes: No scleral icterus.  Neck: Neck supple.  Cardiovascular: Regular rhythm and normal heart sounds.   Pulmonary/Chest: Effort normal and breath  sounds normal. No respiratory distress.  Neurological: She is alert and oriented to person, place, and time.  Skin: Skin is warm and dry.  Psychiatric: She has a normal mood and affect. Her speech is normal.    ED Course  Procedures (including critical care time) Labs Review Labs Reviewed - No data to display Imaging Review No results found.  EKG Interpretation    Date/Time:    Ventricular Rate:    PR Interval:    QRS Duration:   QT Interval:    QTC Calculation:   R Axis:     Text Interpretation:              MDM   1. Acute maxillary sinusitis   2. Left otitis media    1)  Take the prescribed antibiotic as instructed. 2)  Use nasal saline solution (over the counter) at least 3 times a day. 3)  Use over the counter decongestants like Zyrtec-D every 12 hours as needed to help with congestion.  If you have hypertension, do not take medicines with sudafed.  4)  Can take tylenol every 6 hours or motrin every 8 hours for pain or fever. 5)  Follow up with your primary doctor if no improvement in 5-7 days, sooner if increasing pain, fever, or new symptoms.    Advised to quit smoking.  Work note given for [employer].  Janeann Forehand, MD 07/07/13 1901

## 2013-07-17 ENCOUNTER — Emergency Department (INDEPENDENT_AMBULATORY_CARE_PROVIDER_SITE_OTHER): Payer: BC Managed Care – PPO

## 2013-07-17 ENCOUNTER — Emergency Department (INDEPENDENT_AMBULATORY_CARE_PROVIDER_SITE_OTHER)
Admission: EM | Admit: 2013-07-17 | Discharge: 2013-07-17 | Disposition: A | Discharge status: Home / Self Care | Payer: BC Managed Care – PPO | Source: Home / Self Care | Attending: Family Medicine | Admitting: Family Medicine

## 2013-07-17 ENCOUNTER — Encounter: Payer: Self-pay | Admitting: Emergency Medicine

## 2013-07-17 DIAGNOSIS — R0781 Pleurodynia: Secondary | ICD-10-CM

## 2013-07-17 DIAGNOSIS — R05 Cough: Secondary | ICD-10-CM

## 2013-07-17 DIAGNOSIS — R079 Chest pain, unspecified: Secondary | ICD-10-CM

## 2013-07-17 DIAGNOSIS — R059 Cough, unspecified: Secondary | ICD-10-CM

## 2013-07-17 MED ORDER — MELOXICAM 15 MG PO TABS: 15.0000 mg | ORAL_TABLET | Freq: Every day | ORAL | Status: DC

## 2013-07-17 NOTE — ED Notes (Signed)
Ariana Parks c/o intermittent left lower CP that radiates to her back x last night. Pain is described as 4/10 at its worst, "pressure, uncomfortable, sharp at times". Taken Zantac and Gas-x with minimal relief. Hx left rib Fx.

## 2013-07-17 NOTE — Discharge Instructions (Signed)
Apply heating pad once or twice daily.   Chest Wall Pain Chest wall pain is pain in or around the bones and muscles of your chest. It may take up to 6 weeks to get better. It may take longer if you must stay physically active in your work and activities.  CAUSES  Chest wall pain may happen on its own. However, it may be caused by:  A viral illness like the flu.  Injury.  Coughing.  Exercise.  Arthritis.  Fibromyalgia.  Shingles. HOME CARE INSTRUCTIONS   Avoid overtiring physical activity. Try not to strain or perform activities that cause pain. This includes any activities using your chest or your abdominal and side muscles, especially if heavy weights are used.  Put ice on the sore area.  Put ice in a plastic bag.  Place a towel between your skin and the bag.  Leave the ice on for 15-20 minutes per hour while awake for the first 2 days.  Only take over-the-counter or prescription medicines for pain, discomfort, or fever as directed by your caregiver. SEEK IMMEDIATE MEDICAL CARE IF:   Your pain increases, or you are very uncomfortable.  You have a fever.  Your chest pain becomes worse.  You have new, unexplained symptoms.  You have nausea or vomiting.  You feel sweaty or lightheaded.  You have a cough with phlegm (sputum), or you cough up blood. MAKE SURE YOU:   Understand these instructions.  Will watch your condition.  Will get help right away if you are not doing well or get worse. Document Released: 06/11/2005 Document Revised: 09/03/2011 Document Reviewed: 02/05/2011 [facility] Patient Information 2014 [address] [place] [address], [place].

## 2013-07-17 NOTE — ED Provider Notes (Signed)
CSN: [id number]     Arrival date & time 07/17/13  1336 History   First MD Initiated Contact with Patient 07/17/13 1346     Chief Complaint  Patient presents with  . Chest Pain      HPI Comments: Patient states that she normally has mild recurring brief left anterior/inferior/lateral chest pains several times per week that resolve spontaneously.  At about 1 AM while at work this morning she developed similar left chest pain that is worse, and lasting longer than usual.  The pain is worse with movement and inspiration.  She denies shortness of breath, and no fevers, chills, and sweats.  No GI or GU symptoms. She was evaluated here 10 days ago for a respiratory infection, and continues to have an intermittent cough.  She states that she had a left lower lateral rib fracture about 10 years ago.                                                                                                                                                                  Past Medical History  Diagnosis Date  . GERD (gastroesophageal reflux disease)   . Panic attack   . High cholesterol   . Depression    Past Surgical History  Procedure Laterality Date  . Tonsillectomy    . Knee surgery    . Wisdom tooth extraction     Family History  Problem Relation Age of Onset  . Hypertension Mother   . Thyroid disease Mother   . GER disease Father   . Migraines Father   . Thyroid disease Brother   . Heart attack Other    History  Substance Use Topics  . Smoking status: Current Every Day Smoker -- 0.50 packs/day for 15 years    Types: Cigarettes  . Smokeless tobacco: Never Used     Comment: using vapes  . Alcohol Use: No   OB History   Grav Para Term Preterm Abortions TAB SAB Ect Mult Living                 Review of Systems  Constitutional: Negative for fever, chills, diaphoresis and fatigue.  HENT: Negative.   Eyes: Negative.   Respiratory: Positive for cough and chest tightness. Negative for  shortness of breath and wheezing.   Cardiovascular: Negative.   Gastrointestinal: Negative.   Genitourinary: Negative.   Musculoskeletal: Negative.   Skin: Negative.   Neurological: Negative for dizziness and headaches.    Allergies  Celexa; Wellbutrin; and Flexeril  Home Medications   Current Outpatient Rx  Name  Route  Sig  Dispense  Refill  . alum & mag hydroxide-simeth (MAALOX/MYLANTA) 200-200-20 MG/5ML suspension   Oral   Take 10 mLs by mouth every 6 (six) hours  as needed. Patient used this medication for heartburn.         Marland Kitchen HYDROcodone-acetaminophen (NORCO/VICODIN) 5-325 MG per tablet      Take one by mouth at bedtime as needed for pain   10 tablet   0   . meloxicam (MOBIC) 15 MG tablet   Oral   Take 1 tablet (15 mg total) by mouth daily. Take with food.   10 tablet   1   . naproxen sodium (ANAPROX) 220 MG tablet   Oral   Take 440 mg by mouth 2 (two) times daily with a meal. Patient used this medication for pain.         Marland Kitchen omeprazole (PRILOSEC) 20 MG capsule   Oral   Take 20 mg by mouth daily.         . Ranitidine HCl (ZANTAC PO)   Oral   Take 1 tablet by mouth daily as needed. Patient uses this medication for heartburn.         . simethicone (MYLICON) 244 MG chewable tablet   Oral   Chew 125 mg by mouth every 6 (six) hours as needed. Patient used this medication for flatulence.          BP 116/77  Pulse 84  Temp(Src) 97.8 F (36.6 C) (Oral)  Resp 18  SpO2 97%  LMP 05/03/2013 Physical Exam  Nursing note and vitals reviewed. Constitutional: She is oriented to person, place, and time. She appears well-developed and well-nourished. No distress.  HENT:  Head: Normocephalic.  Nose: Nose normal.  Mouth/Throat: Oropharynx is clear and moist.  Eyes: Conjunctivae and EOM are normal. Pupils are equal, round, and reactive to light.  Neck: Neck supple. No thyromegaly present.  There are tender shotty left posterior nodes present  Cardiovascular:  Normal rate, regular rhythm, normal heart sounds and intact distal pulses.  Exam reveals no gallop and no friction rub.   No murmur heard. Pulmonary/Chest: Effort normal and breath sounds normal. No respiratory distress. She has no wheezes. She has no rales.   She exhibits tenderness.    There is tenderness to palpation along left anterior/lateral costal margin as noted on diagram.  Abdominal: Soft. Bowel sounds are normal. She exhibits no distension and no mass. There is no tenderness.  Lymphadenopathy:    She has cervical adenopathy.  Neurological: She is alert and oriented to person, place, and time.  Skin: Skin is warm and dry. No rash noted. She is not diaphoretic.    ED Course  Procedures  none   Imaging Review Dg Chest 2 View  07/17/2013   CLINICAL DATA:  Persistent cough.  Left anterior inferior pain.  EXAM: CHEST  2 VIEW  COMPARISON:  One-view chest and right to rib films 09/25/2012.  FINDINGS: The heart size is normal. The lungs are clear. The visualized soft tissues and bony thorax are unremarkable.  IMPRESSION: 1. Negative two view chest x-ray   Electronically Signed   By: Lawrence Santiago M.D.   On: 07/17/2013 14:45   Dg Ribs Unilateral Left  07/17/2013   CLINICAL DATA:  Recent cough.  Left lower anterior rib pain.  EXAM: LEFT RIBS - 2 VIEW  COMPARISON:  .  FINDINGS: Dedicated imaging of the left lower ribs demonstrates no acute or healing fracture. There is no pneumothorax.  IMPRESSION: Negative left rib films.   Electronically Signed   By: Lawrence Santiago M.D.   On: 07/17/2013 14:51    EKG Interpretation    Date/Time:  07/17/13    Ventricular Rate:  73   PR Interval:  0.168    QRS Duration:  0.94   QT Interval:  0.382    QTC Calculation:  0.404   R Axis:  23 degrees    Text Interpretation:  Sinus rhythm.  RSR(V1) nondiagnostic; Probably Normal              MDM   1. Rib pain on left side; possibly intercostal muscle strain from recent coughing    Begin  Mobic. Apply heating pad once or twice daily.  Avoid heavy lifting. Followup with Family Doctor if not improved in about 2 weeks.    Kandra Nicolas, MD 07/17/13 [phone]

## 2014-06-01 ENCOUNTER — Emergency Department (INDEPENDENT_AMBULATORY_CARE_PROVIDER_SITE_OTHER)
Admission: EM | Admit: 2014-06-01 | Discharge: 2014-06-01 | Disposition: A | Discharge status: Home / Self Care | Payer: BC Managed Care – PPO | Source: Home / Self Care

## 2014-06-01 ENCOUNTER — Encounter: Payer: Self-pay | Admitting: *Deleted

## 2014-06-01 DIAGNOSIS — R05 Cough: Secondary | ICD-10-CM

## 2014-06-01 DIAGNOSIS — X503XXD Overexertion from repetitive movements, subsequent encounter: Secondary | ICD-10-CM

## 2014-06-01 DIAGNOSIS — M62830 Muscle spasm of back: Secondary | ICD-10-CM

## 2014-06-01 DIAGNOSIS — J069 Acute upper respiratory infection, unspecified: Secondary | ICD-10-CM

## 2014-06-01 DIAGNOSIS — S29012D Strain of muscle and tendon of back wall of thorax, subsequent encounter: Secondary | ICD-10-CM

## 2014-06-01 DIAGNOSIS — R059 Cough, unspecified: Secondary | ICD-10-CM

## 2014-06-01 HISTORY — DX: Polycystic ovarian syndrome: E28.2

## 2014-06-01 MED ORDER — BACLOFEN 10 MG PO TABS: 10.0000 mg | ORAL_TABLET | Freq: Three times a day (TID) | ORAL | Status: DC

## 2014-06-01 MED ORDER — MELOXICAM 15 MG PO TABS: ORAL_TABLET | ORAL | Status: DC

## 2014-06-01 MED ORDER — IPRATROPIUM-ALBUTEROL 0.5-2.5 (3) MG/3ML IN SOLN
3.0000 mL | Freq: Once | RESPIRATORY_TRACT | Status: AC
  Administered 2014-06-01: 3 mL via RESPIRATORY_TRACT

## 2014-06-01 MED ORDER — IPRATROPIUM-ALBUTEROL 0.5-2.5 (3) MG/3ML IN SOLN: 3.0000 mL | RESPIRATORY_TRACT | Status: DC

## 2014-06-01 MED ORDER — CEFDINIR 300 MG PO CAPS: 300.0000 mg | ORAL_CAPSULE | Freq: Two times a day (BID) | ORAL | Status: DC

## 2014-06-01 MED ORDER — HYDROCODONE-ACETAMINOPHEN 5-325 MG PO TABS: 1.0000 | ORAL_TABLET | Freq: Three times a day (TID) | ORAL | Status: DC | PRN

## 2014-06-01 NOTE — ED Notes (Signed)
Pt c/o persistent dry cough x 2 months, with some SOB and dizziness at times. Denies fever. She also c/o mid back radiating to her low back x 1 1/2 mths with no injury.

## 2014-06-01 NOTE — ED Provider Notes (Signed)
CSN: [id number]     Arrival date & time 06/01/14  1710 History   None    Chief Complaint  Patient presents with  . Cough  . Shortness of Breath  . Back Pain   (Consider location/radiation/quality/duration/timing/severity/associated sxs/prior Treatment) HPI  Pt presents to the clinic with dry cough for last 2 months. She just feels like her chest is tight. She had URI symptoms about 2 weeks ago but they have resolved. She has some SOB and dizziness at times. Denies any fever or chills. She has not tried anything to make better.   She is also having some mid back pain radiating into her lower back. Hx of back spasm for last 2 years since back "went out" picking up something at work. Denies any radiation into legs or numbness and tingling. Spasms seem to be worsening. Taking aleve with minimal benefit. Has to lift boxes a lot at work. Cannot take flexeril or robaxin due to side effects. No bowel or bladder dysfunction. No saddle anthestheia. No injury or trauma.   Past Medical History  Diagnosis Date  . GERD (gastroesophageal reflux disease)   . Panic attack   . High cholesterol   . Depression   . PCOS (polycystic ovarian syndrome)    Past Surgical History  Procedure Laterality Date  . Tonsillectomy    . Knee surgery    . Wisdom tooth extraction     Family History  Problem Relation Age of Onset  . Hypertension Mother   . Thyroid disease Mother   . GER disease Father   . Migraines Father   . Thyroid disease Brother   . Heart attack Other    History  Substance Use Topics  . Smoking status: Current Every Day Smoker -- 1.00 packs/day for 15 years    Types: Cigarettes  . Smokeless tobacco: Never Used     Comment: using vapes  . Alcohol Use: Yes   OB History    No data available     Review of Systems  All other systems reviewed and are negative.   Allergies  Celexa; Ibuprofen; Wellbutrin; and Flexeril  Home Medications   Prior to Admission medications   Medication  Sig Start Date End Date Taking? Authorizing Provider  alum & mag hydroxide-simeth (MAALOX/MYLANTA) 200-200-20 MG/5ML suspension Take 10 mLs by mouth every 6 (six) hours as needed. Patient used this medication for heartburn.    Historical Provider, MD  baclofen (LIORESAL) 10 MG tablet Take 1 tablet (10 mg total) by mouth 3 (three) times daily. 06/01/14   Jade L Breeback, PA-C  cefdinir (OMNICEF) 300 MG capsule Take 1 capsule (300 mg total) by mouth 2 (two) times daily. For 10 days. 06/01/14   Jade L Breeback, PA-C  HYDROcodone-acetaminophen (NORCO/VICODIN) 5-325 MG per tablet Take 1 tablet by mouth every 8 (eight) hours as needed for moderate pain. 06/01/14   Jade L Breeback, PA-C  meloxicam (MOBIC) 15 MG tablet One tab PO qAM with breakfast for 2 weeks, then daily prn pain. 06/01/14   Jade L Breeback, PA-C  naproxen sodium (ANAPROX) 220 MG tablet Take 440 mg by mouth 2 (two) times daily with a meal. Patient used this medication for pain.    Historical Provider, MD  omeprazole (PRILOSEC) 20 MG capsule Take 20 mg by mouth daily.    Historical Provider, MD  Ranitidine HCl (ZANTAC PO) Take 1 tablet by mouth daily as needed. Patient uses this medication for heartburn.    Historical Provider, MD  simethicone (  MYLICON) 808 MG chewable tablet Chew 125 mg by mouth every 6 (six) hours as needed. Patient used this medication for flatulence.    Historical Provider, MD   BP 122/75 mmHg  Pulse 70  Temp(Src) 97.9 F (36.6 C) (Oral)  Resp 18  Ht 5\' 9"  (1.753 m)  Wt 290 lb (131.543 kg)  BMI 42.81 kg/m2  SpO2 98% Physical Exam  Constitutional: She is oriented to person, place, and time. She appears well-developed and well-nourished.  HENT:  Head: Normocephalic and atraumatic.  Right Ear: External ear normal.  Left Ear: External ear normal.  Nose: Nose normal.  Mouth/Throat: Oropharynx is clear and moist.  Eyes: Conjunctivae are normal.  Neck: Normal range of motion. Neck supple.  Cardiovascular: Normal rate,  regular rhythm and normal heart sounds.   Pulmonary/Chest: Effort normal and breath sounds normal.  Decreased air movement on initial exam.  After duoneb, air movement has improved.    Musculoskeletal:  Tenderness and tightness over mid-back muscles to palpation.  No pain directly over spine.  ROM at waist normal with discomfort per pt with flexion and extension.    Lymphadenopathy:    She has no cervical adenopathy.  Neurological: She is alert and oriented to person, place, and time.  Skin: Skin is dry.  Psychiatric: She has a normal mood and affect. Her behavior is normal.    ED Course  Procedures (including critical care time) Labs Review Labs Reviewed - No data to display  Imaging Review No results found.   MDM   1. Acute upper respiratory infection   2. Cough   3. Spasm of muscle, back   4. Repetitive strain injury of mid back, subsequent encounter    duoneb given with some improvement. Pt did feel more dizzy after treatment. Reassured of side effect.  omnicef given for bronchitis coverage.  Discussed episodic muscle spasms.  Encouraged muscle strengthening exercises.  Baclofen to take at bedtime for pt to try.  mobic to take as needed for pain and inflammation.  Short supply of norco for acute pain. Discussed only as needed.  Toradol offered but pt declined because she did not want a shot.  Consider heat to relax muscle.  Written out of work for one night to rest.  Follow up as needed.  Discussed need for PCP.     Donella Stade, PA-C 06/01/14 1951

## 2014-06-01 NOTE — Discharge Instructions (Signed)

## 2014-07-08 ENCOUNTER — Encounter: Payer: Self-pay | Admitting: Emergency Medicine

## 2014-07-08 ENCOUNTER — Emergency Department (INDEPENDENT_AMBULATORY_CARE_PROVIDER_SITE_OTHER)
Admission: EM | Admit: 2014-07-08 | Discharge: 2014-07-08 | Disposition: A | Discharge status: Home / Self Care | Payer: BLUE CROSS/BLUE SHIELD | Source: Home / Self Care | Attending: Emergency Medicine | Admitting: Emergency Medicine

## 2014-07-08 DIAGNOSIS — J208 Acute bronchitis due to other specified organisms: Secondary | ICD-10-CM

## 2014-07-08 MED ORDER — ALBUTEROL SULFATE HFA 108 (90 BASE) MCG/ACT IN AERS: 1.0000 | INHALATION_SPRAY | Freq: Four times a day (QID) | RESPIRATORY_TRACT | Status: DC | PRN

## 2014-07-08 MED ORDER — PREDNISONE 10 MG PO TABS: ORAL_TABLET | ORAL | Status: DC

## 2014-07-08 MED ORDER — AZITHROMYCIN 250 MG PO TABS: 250.0000 mg | ORAL_TABLET | Freq: Every day | ORAL | Status: DC

## 2014-07-08 NOTE — ED Notes (Signed)
Reports 3 month history of intermittent congestion/cough/headache; hoarseness and fatigue with some dizziness.

## 2014-07-08 NOTE — ED Provider Notes (Signed)
CSN: [id number]     Arrival date & time 07/08/14  1916 History   First MD Initiated Contact with Patient 07/08/14 1937     Chief Complaint  Patient presents with  . Nasal Congestion  . Cough  . Headache  . Fatigue  . Hoarse  . Wheezing  . Morning Sickness   (Consider location/radiation/quality/duration/timing/severity/associated sxs/prior Treatment) Patient is a 34 y.o. female presenting with cough, headaches, and wheezing. The history is provided by the patient. No language interpreter was used.  Cough Cough characteristics:  Productive Sputum characteristics:  Clear Severity:  Moderate Onset quality:  Gradual Duration:  12 weeks Timing:  Constant Chronicity:  New Smoker: no   Relieved by:  Nothing Worsened by:  Nothing tried Ineffective treatments:  None tried Associated symptoms: headaches and wheezing   Headache Associated symptoms: cough   Wheezing Associated symptoms: cough and headaches     Past Medical History  Diagnosis Date  . GERD (gastroesophageal reflux disease)   . Panic attack   . High cholesterol   . Depression   . PCOS (polycystic ovarian syndrome)    Past Surgical History  Procedure Laterality Date  . Tonsillectomy    . Knee surgery    . Wisdom tooth extraction     Family History  Problem Relation Age of Onset  . Hypertension Mother   . Thyroid disease Mother   . GER disease Father   . Migraines Father   . Thyroid disease Brother   . Heart attack Other    History  Substance Use Topics  . Smoking status: Current Every Day Smoker -- 1.00 packs/day for 15 years    Types: Cigarettes  . Smokeless tobacco: Never Used     Comment: using vapes  . Alcohol Use: Yes   OB History    No data available     Review of Systems  Respiratory: Positive for cough and wheezing.   Neurological: Positive for headaches.  All other systems reviewed and are negative.   Allergies  Celexa; Ibuprofen; Wellbutrin; and Flexeril  Home Medications    Prior to Admission medications   Medication Sig Start Date End Date Taking? Authorizing Provider  albuterol (PROVENTIL HFA;VENTOLIN HFA) 108 (90 BASE) MCG/ACT inhaler Inhale 1-2 puffs into the lungs every 6 (six) hours as needed for wheezing or shortness of breath. 07/08/14   Fransico Meadow, PA-C  alum & mag hydroxide-simeth (MAALOX/MYLANTA) 200-200-20 MG/5ML suspension Take 10 mLs by mouth every 6 (six) hours as needed. Patient used this medication for heartburn.    Historical Provider, MD  azithromycin (ZITHROMAX) 250 MG tablet Take 1 tablet (250 mg total) by mouth daily. Take first 2 tablets together, then 1 every day until finished. 07/08/14   Fransico Meadow, PA-C  baclofen (LIORESAL) 10 MG tablet Take 1 tablet (10 mg total) by mouth 3 (three) times daily. 06/01/14   Jade L Breeback, PA-C  cefdinir (OMNICEF) 300 MG capsule Take 1 capsule (300 mg total) by mouth 2 (two) times daily. For 10 days. 06/01/14   Jade L Breeback, PA-C  HYDROcodone-acetaminophen (NORCO/VICODIN) 5-325 MG per tablet Take 1 tablet by mouth every 8 (eight) hours as needed for moderate pain. 06/01/14   Jade L Breeback, PA-C  meloxicam (MOBIC) 15 MG tablet One tab PO qAM with breakfast for 2 weeks, then daily prn pain. 06/01/14   Jade L Breeback, PA-C  naproxen sodium (ANAPROX) 220 MG tablet Take 440 mg by mouth 2 (two) times daily with a meal. Patient used  this medication for pain.    Historical Provider, MD  omeprazole (PRILOSEC) 20 MG capsule Take 20 mg by mouth daily.    Historical Provider, MD  predniSONE (DELTASONE) 10 MG tablet 6.5.4.3.2.1 taper 07/08/14   Fransico Meadow, PA-C  Ranitidine HCl (ZANTAC PO) Take 1 tablet by mouth daily as needed. Patient uses this medication for heartburn.    Historical Provider, MD  simethicone (MYLICON) 956 MG chewable tablet Chew 125 mg by mouth every 6 (six) hours as needed. Patient used this medication for flatulence.    Historical Provider, MD   BP 122/76 mmHg  Pulse 74  Temp(Src) 97.6  F (36.4 C) (Oral)  Resp 16  Ht 5\' 9"  (1.753 m)  Wt 280 lb (127.007 kg)  BMI 41.33 kg/m2  SpO2 97% Physical Exam  Constitutional: She is oriented to person, place, and time. She appears well-developed and well-nourished.  HENT:  Head: Normocephalic and atraumatic.  Right Ear: External ear normal.  Left Ear: External ear normal.  Nose: Nose normal.  Mouth/Throat: Oropharynx is clear and moist.  Eyes: Conjunctivae and EOM are normal. Pupils are equal, round, and reactive to light.  Neck: Normal range of motion.  Cardiovascular: Normal rate and regular rhythm.   Pulmonary/Chest: Effort normal and breath sounds normal.  Abdominal: She exhibits no distension.  Musculoskeletal: Normal range of motion.  Neurological: She is alert and oriented to person, place, and time.  Skin: Skin is warm.  Psychiatric: She has a normal mood and affect.  Nursing note and vitals reviewed.   ED Course  Procedures (including critical care time) Labs Review Labs Reviewed - No data to display  Imaging Review No results found.   MDM  Pt counseled on stopping smoking.     1. Acute bronchitis due to other specified organisms    zithromax Prednisone Albuterol inhaler Primary care referrals. AVS    Fransico Meadow, PA-C 07/08/14 1954

## 2014-07-08 NOTE — Discharge Instructions (Signed)

## 2014-10-25 ENCOUNTER — Emergency Department (INDEPENDENT_AMBULATORY_CARE_PROVIDER_SITE_OTHER)
Admission: EM | Admit: 2014-10-25 | Discharge: 2014-10-25 | Disposition: A | Discharge status: Home / Self Care | Payer: BLUE CROSS/BLUE SHIELD | Source: Home / Self Care | Attending: Family Medicine | Admitting: Family Medicine

## 2014-10-25 ENCOUNTER — Encounter: Payer: Self-pay | Admitting: *Deleted

## 2014-10-25 DIAGNOSIS — J0100 Acute maxillary sinusitis, unspecified: Secondary | ICD-10-CM | POA: Diagnosis not present

## 2014-10-25 DIAGNOSIS — J069 Acute upper respiratory infection, unspecified: Secondary | ICD-10-CM | POA: Diagnosis not present

## 2014-10-25 DIAGNOSIS — B9789 Other viral agents as the cause of diseases classified elsewhere: Principal | ICD-10-CM

## 2014-10-25 LAB — POCT RAPID STREP A (OFFICE): RAPID STREP A SCREEN: NEGATIVE

## 2014-10-25 MED ORDER — BENZONATATE 200 MG PO CAPS: 200.0000 mg | ORAL_CAPSULE | Freq: Every day | ORAL | Status: DC

## 2014-10-25 MED ORDER — AMOXICILLIN 500 MG PO CAPS: 500.0000 mg | ORAL_CAPSULE | Freq: Three times a day (TID) | ORAL | Status: DC

## 2014-10-25 NOTE — ED Notes (Signed)
Pt c/o RT ear popping, sore throat, nasal congestion, and HA x 1 day. Denies fever. She also reports vomiting x 2 this morning.

## 2014-10-25 NOTE — ED Provider Notes (Signed)
CSN: [id number]     Arrival date & time 10/25/14  1501 History   First MD Initiated Contact with Patient 10/25/14 1525     Chief Complaint  Patient presents with  . Sore Throat  . Nasal Congestion      HPI Comments: Patient complains of sore throat, cough, and sinus congestion for one week.  Her cough has become worse, and she now complains of facial pressure and sore throat.  She had two episodes of vomiting this morning.                                                                                                                                                                                  The history is provided by the patient.    Past Medical History  Diagnosis Date  . GERD (gastroesophageal reflux disease)   . Panic attack   . High cholesterol   . Depression   . PCOS (polycystic ovarian syndrome)    Past Surgical History  Procedure Laterality Date  . Tonsillectomy    . Knee surgery    . Wisdom tooth extraction     Family History  Problem Relation Age of Onset  . Hypertension Mother   . Thyroid disease Mother   . GER disease Father   . Migraines Father   . Thyroid disease Brother   . Heart attack Other    History  Substance Use Topics  . Smoking status: Current Every Day Smoker -- 1.00 packs/day for 15 years    Types: Cigarettes  . Smokeless tobacco: Never Used     Comment: using vapes  . Alcohol Use: Yes   OB History    No data available     Review of Systems + sore throat + hoarse + cough No pleuritic pain + wheezing + nasal congestion + post-nasal drainage + sinus pain/pressure + eye drainage ? earache No hemoptysis No SOB No fever, + chills + nausea, resolved + vomiting, resolved No abdominal pain No diarrhea No urinary symptoms No skin rash + fatigue No myalgias + headache Used OTC meds without relief  Allergies  Celexa; Ibuprofen; Wellbutrin; and Flexeril  Home Medications   Prior to Admission medications   Medication Sig  Start Date End Date Taking? Authorizing Provider  albuterol (PROVENTIL HFA;VENTOLIN HFA) 108 (90 BASE) MCG/ACT inhaler Inhale 1-2 puffs into the lungs every 6 (six) hours as needed for wheezing or shortness of breath. 07/08/14   Fransico Meadow, PA-C  alum & mag hydroxide-simeth (MAALOX/MYLANTA) 200-200-20 MG/5ML suspension Take 10 mLs by mouth every 6 (six) hours as needed. Patient used this medication for heartburn.    Historical Provider, MD  amoxicillin (AMOXIL)  500 MG capsule Take 1 capsule (500 mg total) by mouth 3 (three) times daily. 10/25/14   Kandra Nicolas, MD  azithromycin (ZITHROMAX) 250 MG tablet Take 1 tablet (250 mg total) by mouth daily. Take first 2 tablets together, then 1 every day until finished. 07/08/14   Fransico Meadow, PA-C  baclofen (LIORESAL) 10 MG tablet Take 1 tablet (10 mg total) by mouth 3 (three) times daily. 06/01/14   Jade L Breeback, PA-C  benzonatate (TESSALON) 200 MG capsule Take 1 capsule (200 mg total) by mouth at bedtime. Take as needed for cough 10/25/14   Kandra Nicolas, MD  cefdinir (OMNICEF) 300 MG capsule Take 1 capsule (300 mg total) by mouth 2 (two) times daily. For 10 days. 06/01/14   Jade L Breeback, PA-C  HYDROcodone-acetaminophen (NORCO/VICODIN) 5-325 MG per tablet Take 1 tablet by mouth every 8 (eight) hours as needed for moderate pain. 06/01/14   Jade L Breeback, PA-C  meloxicam (MOBIC) 15 MG tablet One tab PO qAM with breakfast for 2 weeks, then daily prn pain. 06/01/14   Jade L Breeback, PA-C  naproxen sodium (ANAPROX) 220 MG tablet Take 440 mg by mouth 2 (two) times daily with a meal. Patient used this medication for pain.    Historical Provider, MD  omeprazole (PRILOSEC) 20 MG capsule Take 20 mg by mouth daily.    Historical Provider, MD  predniSONE (DELTASONE) 10 MG tablet 6.5.4.3.2.1 taper 07/08/14   Fransico Meadow, PA-C  Ranitidine HCl (ZANTAC PO) Take 1 tablet by mouth daily as needed. Patient uses this medication for heartburn.    Historical Provider,  MD  simethicone (MYLICON) 426 MG chewable tablet Chew 125 mg by mouth every 6 (six) hours as needed. Patient used this medication for flatulence.    Historical Provider, MD   BP 115/76 mmHg  Pulse 65  Temp(Src) 97.7 F (36.5 C) (Oral)  Resp 18  Ht 5\' 9"  (1.753 m)  Wt 291 lb (131.997 kg)  BMI 42.95 kg/m2  SpO2 98% Physical Exam Nursing notes and Vital Signs reviewed. Appearance:  Patient appears stated age, and in no acute distress.  Patient is obese (BMI 43.0) Eyes:  Pupils are equal, round, and reactive to light and accomodation.  Extraocular movement is intact.  Conjunctivae are not inflamed  Ears:  Canals normal.  Tympanic membranes normal.  Nose:  Mildly congested turbinates.  Maxillary sinus tenderness is present.  Pharynx:  Minimal erythema Neck:  Supple.  Tender enlarged posterior nodes are palpated bilaterally  Lungs:  Clear to auscultation.  Breath sounds are equal.  Heart:  Regular rate and rhythm without murmurs, rubs, or gallops.  Abdomen:  Nontender without masses or hepatosplenomegaly.  Bowel sounds are present.  No CVA or flank tenderness.  Extremities:  No edema.  No calf tenderness Skin:  No rash present.   ED Course  Procedures  None   Labs Reviewed  POCT RAPID STREP A (OFFICE) negative      MDM   1. Viral URI with cough   2. Acute maxillary sinusitis, recurrence not specified    Begin amoxicillin 500mg  TID.  Prescription written for Benzonatate [facility]) to take at bedtime for night-time cough.  Take plain guaifenesin (1200mg  extended release tabs such as Mucinex) twice daily, with plenty of water, for cough and congestion.  May add Pseudoephedrine (30mg , one or two every 4 to 6 hours) for sinus congestion.  Get adequate rest.   May use Afrin nasal spray (or generic oxymetazoline) twice  daily for about 5 days.  Also recommend using saline nasal spray several times daily and saline nasal irrigation (AYR is a common brand).  Use Flonase nasal spray each  morning after using Afrin nasal spray and saline nasal irrigation. Try warm salt water gargles for sore throat.  Stop all antihistamines for now, and other non-prescription cough/cold preparations. May take Ibuprofen 200mg , 4 tabs every 8 hours with food for chest/sternum discomfort.   Follow-up with family doctor if not improving about10 days.     Kandra Nicolas, MD 10/27/14 [phone]

## 2014-10-25 NOTE — Discharge Instructions (Signed)
Take plain guaifenesin (1200mg  extended release tabs such as Mucinex) twice daily, with plenty of water, for cough and congestion.  May add Pseudoephedrine (30mg , one or two every 4 to 6 hours) for sinus congestion.  Get adequate rest.   May use Afrin nasal spray (or generic oxymetazoline) twice daily for about 5 days.  Also recommend using saline nasal spray several times daily and saline nasal irrigation (AYR is a common brand).  Use Flonase nasal spray each morning after using Afrin nasal spray and saline nasal irrigation. Try warm salt water gargles for sore throat.  Stop all antihistamines for now, and other non-prescription cough/cold preparations. May take Ibuprofen 200mg , 4 tabs every 8 hours with food for chest/sternum discomfort.   Follow-up with family doctor if not improving about10 days.

## 2014-10-26 ENCOUNTER — Telehealth: Payer: Self-pay | Admitting: Emergency Medicine

## 2014-11-07 ENCOUNTER — Emergency Department (INDEPENDENT_AMBULATORY_CARE_PROVIDER_SITE_OTHER)
Admission: EM | Admit: 2014-11-07 | Discharge: 2014-11-07 | Disposition: A | Discharge status: Home / Self Care | Payer: BLUE CROSS/BLUE SHIELD | Source: Home / Self Care | Attending: Emergency Medicine | Admitting: Emergency Medicine

## 2014-11-07 ENCOUNTER — Encounter: Payer: Self-pay | Admitting: *Deleted

## 2014-11-07 DIAGNOSIS — J208 Acute bronchitis due to other specified organisms: Secondary | ICD-10-CM

## 2014-11-07 MED ORDER — AZITHROMYCIN 250 MG PO TABS: 250.0000 mg | ORAL_TABLET | Freq: Every day | ORAL | Status: DC

## 2014-11-07 MED ORDER — PREDNISONE 10 MG PO TABS: ORAL_TABLET | ORAL | Status: DC

## 2014-11-07 NOTE — Discharge Instructions (Signed)

## 2014-11-07 NOTE — ED Notes (Signed)
Pt was seen almost 2 weeks ago here for URI and is still having symptoms.

## 2014-11-07 NOTE — ED Provider Notes (Signed)
CSN: [id number]     Arrival date & time 11/07/14  1725 History   First MD Initiated Contact with Patient 11/07/14 1741     Chief Complaint  Patient presents with  . Cough  . Nasal Congestion   (Consider location/radiation/quality/duration/timing/severity/associated sxs/prior Treatment) Patient is a 34 y.o. female presenting with cough. The history is provided by the patient. No language interpreter was used.  Cough Cough characteristics:  Productive Sputum characteristics:  Nondescript Severity:  Moderate Onset quality:  Gradual Duration:  2 weeks Timing:  Constant Progression:  Worsening Chronicity:  New Smoker: no   Context: upper respiratory infection   Relieved by:  Nothing Worsened by:  Nothing tried Ineffective treatments:  Decongestant Associated symptoms: no chest pain   Risk factors: recent infection   Pt reports she has had a cough for 2 weeks.  Pt has congestion.  Pt treated here.  Pt reports symptoms have been persist.  Past Medical History  Diagnosis Date  . GERD (gastroesophageal reflux disease)   . Panic attack   . High cholesterol   . Depression   . PCOS (polycystic ovarian syndrome)    Past Surgical History  Procedure Laterality Date  . Tonsillectomy    . Knee surgery    . Wisdom tooth extraction     Family History  Problem Relation Age of Onset  . Hypertension Mother   . Thyroid disease Mother   . GER disease Father   . Migraines Father   . Thyroid disease Brother   . Heart attack Other    History  Substance Use Topics  . Smoking status: Current Every Day Smoker -- 1.00 packs/day for 15 years    Types: Cigarettes  . Smokeless tobacco: Never Used     Comment: using vapes  . Alcohol Use: Yes   OB History    No data available     Review of Systems  Respiratory: Positive for cough.   Cardiovascular: Negative for chest pain.  All other systems reviewed and are negative.   Allergies  Celexa; Ibuprofen; Wellbutrin; and Flexeril  Home  Medications   Prior to Admission medications   Medication Sig Start Date End Date Taking? Authorizing Provider  albuterol (PROVENTIL HFA;VENTOLIN HFA) 108 (90 BASE) MCG/ACT inhaler Inhale 1-2 puffs into the lungs every 6 (six) hours as needed for wheezing or shortness of breath. 07/08/14   Fransico Meadow, PA-C  alum & mag hydroxide-simeth (MAALOX/MYLANTA) 200-200-20 MG/5ML suspension Take 10 mLs by mouth every 6 (six) hours as needed. Patient used this medication for heartburn.    Historical Provider, MD  amoxicillin (AMOXIL) 500 MG capsule Take 1 capsule (500 mg total) by mouth 3 (three) times daily. 10/25/14   Kandra Nicolas, MD  azithromycin (ZITHROMAX) 250 MG tablet Take 1 tablet (250 mg total) by mouth daily. Take first 2 tablets together, then 1 every day until finished. 11/07/14   Fransico Meadow, PA-C  baclofen (LIORESAL) 10 MG tablet Take 1 tablet (10 mg total) by mouth 3 (three) times daily. 06/01/14   Jade L Breeback, PA-C  benzonatate (TESSALON) 200 MG capsule Take 1 capsule (200 mg total) by mouth at bedtime. Take as needed for cough 10/25/14   Kandra Nicolas, MD  cefdinir (OMNICEF) 300 MG capsule Take 1 capsule (300 mg total) by mouth 2 (two) times daily. For 10 days. 06/01/14   Jade L Breeback, PA-C  HYDROcodone-acetaminophen (NORCO/VICODIN) 5-325 MG per tablet Take 1 tablet by mouth every 8 (eight) hours as needed  for moderate pain. 06/01/14   Jade L Breeback, PA-C  meloxicam (MOBIC) 15 MG tablet One tab PO qAM with breakfast for 2 weeks, then daily prn pain. 06/01/14   Jade L Breeback, PA-C  naproxen sodium (ANAPROX) 220 MG tablet Take 440 mg by mouth 2 (two) times daily with a meal. Patient used this medication for pain.    Historical Provider, MD  omeprazole (PRILOSEC) 20 MG capsule Take 20 mg by mouth daily.    Historical Provider, MD  predniSONE (DELTASONE) 10 MG tablet 6.5.4.3.2.1 taper 11/07/14   Fransico Meadow, PA-C  Ranitidine HCl (ZANTAC PO) Take 1 tablet by mouth daily as needed.  Patient uses this medication for heartburn.    Historical Provider, MD  simethicone (MYLICON) 453 MG chewable tablet Chew 125 mg by mouth every 6 (six) hours as needed. Patient used this medication for flatulence.    Historical Provider, MD   BP 123/83 mmHg  Pulse 89  Temp(Src) 98 F (36.7 C) (Oral)  Ht 5\' 9"  (1.753 m)  Wt 292 lb (132.45 kg)  BMI 43.10 kg/m2  SpO2 99% Physical Exam  Constitutional: She appears well-developed and well-nourished.  HENT:  Head: Normocephalic.  Right Ear: External ear normal.  Left Ear: External ear normal.  Nose: Nose normal.  Mouth/Throat: Oropharynx is clear and moist.  Eyes: Conjunctivae and EOM are normal. Pupils are equal, round, and reactive to light.  Neck: Normal range of motion. Neck supple.  Cardiovascular: Normal rate and normal heart sounds.   Pulmonary/Chest: Effort normal and breath sounds normal.  Abdominal: Soft.  Musculoskeletal: Normal range of motion.  Neurological: She is alert.  Skin: Skin is warm.  Psychiatric: She has a normal mood and affect.  Nursing note and vitals reviewed.   ED Course  Procedures (including critical care time) Labs Review Labs Reviewed - No data to display  Imaging Review No results found.   MDM   1. Acute bronchitis due to other specified organisms    zpack Prednisone taper AVS Return if any problems   Fransico Meadow, PA-C 11/07/14 [place], PA-C 11/07/14 1758

## 2015-01-11 ENCOUNTER — Emergency Department (INDEPENDENT_AMBULATORY_CARE_PROVIDER_SITE_OTHER)
Admission: EM | Admit: 2015-01-11 | Discharge: 2015-01-11 | Disposition: A | Discharge status: Home / Self Care | Payer: BLUE CROSS/BLUE SHIELD | Source: Home / Self Care | Attending: Family Medicine | Admitting: Family Medicine

## 2015-01-11 ENCOUNTER — Encounter: Payer: Self-pay | Admitting: *Deleted

## 2015-01-11 ENCOUNTER — Emergency Department (INDEPENDENT_AMBULATORY_CARE_PROVIDER_SITE_OTHER): Payer: BLUE CROSS/BLUE SHIELD

## 2015-01-11 DIAGNOSIS — K219 Gastro-esophageal reflux disease without esophagitis: Secondary | ICD-10-CM | POA: Diagnosis not present

## 2015-01-11 DIAGNOSIS — R053 Chronic cough: Secondary | ICD-10-CM

## 2015-01-11 DIAGNOSIS — R05 Cough: Secondary | ICD-10-CM

## 2015-01-11 MED ORDER — BENZONATATE 200 MG PO CAPS: 200.0000 mg | ORAL_CAPSULE | Freq: Every day | ORAL | Status: DC

## 2015-01-11 MED ORDER — OMEPRAZOLE 40 MG PO CPDR: 40.0000 mg | DELAYED_RELEASE_CAPSULE | Freq: Two times a day (BID) | ORAL | Status: DC

## 2015-01-11 NOTE — ED Provider Notes (Signed)
CSN: [id number]     Arrival date & time 01/11/15  1614 History   First MD Initiated Contact with Patient 01/11/15 1626     Chief Complaint  Patient presents with  . Cough  . Neck Pain      HPI Comments: Patient complains of a persistent non-productive cough for three months.  It started has a cold with sore throat, nasal congestion, etc but the cough persists.  She has occasional shortness of breath and wheezing with activity.  She notes that she awakens each morning with phlegm in her throat and increased cough.  The cough improves during the day.  Family members tell her that she coughs all night long.  No fevers, chills, and sweats.  She continues to smoke.  The history is provided by the patient.    Past Medical History  Diagnosis Date  . GERD (gastroesophageal reflux disease)   . Panic attack   . High cholesterol   . Depression   . PCOS (polycystic ovarian syndrome)    Past Surgical History  Procedure Laterality Date  . Tonsillectomy    . Knee surgery    . Wisdom tooth extraction     Family History  Problem Relation Age of Onset  . Hypertension Mother   . Thyroid disease Mother   . GER disease Father   . Migraines Father   . Thyroid disease Brother   . Heart attack Other    History  Substance Use Topics  . Smoking status: Current Every Day Smoker -- 1.00 packs/day for 15 years    Types: Cigarettes  . Smokeless tobacco: Never Used     Comment: using vapes  . Alcohol Use: Yes   OB History    No data available     Review of Systems + sore throat upon awakening + cough + sneezing No pleuritic pain + wheezing + nasal congestion + post-nasal drainage No sinus pain/pressure No itchy/red eyes No earache No hemoptysis + SOB with activity No fever/chills No nausea No vomiting No abdominal pain No diarrhea No urinary symptoms No skin rash No fatigue No myalgias + headache Used OTC meds without relief  Allergies  Celexa; Ibuprofen; Wellbutrin; and  Flexeril  Home Medications   Prior to Admission medications   Medication Sig Start Date End Date Taking? Authorizing Provider  albuterol (PROVENTIL HFA;VENTOLIN HFA) 108 (90 BASE) MCG/ACT inhaler Inhale 1-2 puffs into the lungs every 6 (six) hours as needed for wheezing or shortness of breath. 07/08/14   Fransico Meadow, PA-C  alum & mag hydroxide-simeth (MAALOX/MYLANTA) 200-200-20 MG/5ML suspension Take 10 mLs by mouth every 6 (six) hours as needed. Patient used this medication for heartburn.    Historical Provider, MD  baclofen (LIORESAL) 10 MG tablet Take 1 tablet (10 mg total) by mouth 3 (three) times daily. 06/01/14   Jade L Breeback, PA-C  benzonatate (TESSALON) 200 MG capsule Take 1 capsule (200 mg total) by mouth at bedtime. Take as needed for cough 01/11/15   Kandra Nicolas, MD  meloxicam (MOBIC) 15 MG tablet One tab PO qAM with breakfast for 2 weeks, then daily prn pain. 06/01/14   Jade L Breeback, PA-C  naproxen sodium (ANAPROX) 220 MG tablet Take 440 mg by mouth 2 (two) times daily with a meal. Patient used this medication for pain.    Historical Provider, MD  omeprazole (PRILOSEC) 40 MG capsule Take 1 capsule (40 mg total) by mouth 2 (two) times daily. Take 30 minutes before a meal. 01/11/15  Kandra Nicolas, MD  Ranitidine HCl (ZANTAC PO) Take 1 tablet by mouth daily as needed. Patient uses this medication for heartburn.    Historical Provider, MD  simethicone (MYLICON) 076 MG chewable tablet Chew 125 mg by mouth every 6 (six) hours as needed. Patient used this medication for flatulence.    Historical Provider, MD   BP 123/78 mmHg  Pulse 71  Temp(Src) 98.2 F (36.8 C) (Oral)  Resp 16  Wt 300 lb (136.079 kg)  SpO2 98% Physical Exam Nursing notes and Vital Signs reviewed. Appearance:  Patient appears stated age, and in no acute distress.  Patient is obese. Eyes:  Pupils are equal, round, and reactive to light and accomodation.  Extraocular movement is intact.  Conjunctivae are not  inflamed  Ears:  Canals normal.  Tympanic membranes normal.  Nose:  Mildly congested turbinates.  No sinus tenderness.   Pharynx:  Normal Neck:  Supple.  Slightly tender shotty posterior nodes are palpated bilaterally  Lungs:  Clear to auscultation.  Breath sounds are equal.  Moving air well. Chest:  Distinct tenderness to palpation over the mid-sternum.  Heart:  Regular rate and rhythm without murmurs, rubs, or gallops.  Abdomen:  Nontender without masses or hepatosplenomegaly.  Bowel sounds are present.  No CVA or flank tenderness.  Extremities:  No edema.  No calf tenderness Skin:  No rash present.   ED Course  Procedures  none  Imaging Review Dg Chest 2 View  01/11/2015   CLINICAL DATA:  Cough for 3 months, history of tobacco use  EXAM: CHEST - 2 VIEW  COMPARISON:  None.  FINDINGS: The heart size and mediastinal contours are within normal limits. Both lungs are clear. The visualized skeletal structures are unremarkable.  IMPRESSION: No active disease.   Electronically Signed   By: Inez Catalina M.D.   On: 01/11/2015 16:58     MDM   1. Persistent cough for 3 weeks or longer   2. Gastroesophageal reflux disease, esophagitis presence not specified    Begin omeprazole 40mg  BID. After one week, decrease omeprazole to one capsule each evening. Discussed reflux precautions. Followup with Family Doctor in about two weeks, especially if not improving.    Kandra Nicolas, MD 01/11/15 [phone]

## 2015-01-11 NOTE — Discharge Instructions (Signed)
After one week, decrease omeprazole to one capsule each evening.   Gastroesophageal Reflux Disease, Adult Gastroesophageal reflux disease (GERD) happens when acid from your stomach flows up into the esophagus. When acid comes in contact with the esophagus, the acid causes soreness (inflammation) in the esophagus. Over time, GERD may create small holes (ulcers) in the lining of the esophagus. CAUSES   Increased body weight. This puts pressure on the stomach, making acid rise from the stomach into the esophagus.  Smoking. This increases acid production in the stomach.  Drinking alcohol. This causes decreased pressure in the lower esophageal sphincter (valve or ring of muscle between the esophagus and stomach), allowing acid from the stomach into the esophagus.  Late evening meals and a full stomach. This increases pressure and acid production in the stomach.  A malformed lower esophageal sphincter. Sometimes, no cause is found. SYMPTOMS   Burning pain in the lower part of the mid-chest behind the breastbone and in the mid-stomach area. This may occur twice a week or more often.  Trouble swallowing.  Sore throat.  Dry cough.  Asthma-like symptoms including chest tightness, shortness of breath, or wheezing. DIAGNOSIS  Your caregiver may be able to diagnose GERD based on your symptoms. In some cases, X-rays and other tests may be done to check for complications or to check the condition of your stomach and esophagus. TREATMENT  Your caregiver may recommend over-the-counter or prescription medicines to help decrease acid production. Ask your caregiver before starting or adding any new medicines.  HOME CARE INSTRUCTIONS   Change the factors that you can control. Ask your caregiver for guidance concerning weight loss, quitting smoking, and alcohol consumption.  Avoid foods and drinks that make your symptoms worse, such as:  Caffeine or alcoholic drinks.  Chocolate.  Peppermint or  mint flavorings.  Garlic and onions.  Spicy foods.  Citrus fruits, such as oranges, lemons, or limes.  Tomato-based foods such as sauce, chili, salsa, and pizza.  Fried and fatty foods.  Avoid lying down for the 3 hours prior to your bedtime or prior to taking a nap.  Eat small, frequent meals instead of large meals.  Wear loose-fitting clothing. Do not wear anything tight around your waist that causes pressure on your stomach.  Raise the head of your bed 6 to 8 inches with wood blocks to help you sleep. Extra pillows will not help.  Only take over-the-counter or prescription medicines for pain, discomfort, or fever as directed by your caregiver.  Do not take aspirin, ibuprofen, or other nonsteroidal anti-inflammatory drugs (NSAIDs). SEEK IMMEDIATE MEDICAL CARE IF:   You have pain in your arms, neck, jaw, teeth, or back.  Your pain increases or changes in intensity or duration.  You develop nausea, vomiting, or sweating (diaphoresis).  You develop shortness of breath, or you faint.  Your vomit is green, yellow, black, or looks like coffee grounds or blood.  Your stool is red, bloody, or black. These symptoms could be signs of other problems, such as heart disease, gastric bleeding, or esophageal bleeding. MAKE SURE YOU:   Understand these instructions.  Will watch your condition.  Will get help right away if you are not doing well or get worse. Document Released: 03/21/2005 Document Revised: 09/03/2011 Document Reviewed: 12/29/2010 [facility] Patient Information 2015 [place], [place]. This information is not intended to replace advice given to you by your health care provider. Make sure you discuss any questions you have with your health care provider.

## 2015-01-11 NOTE — ED Notes (Signed)
Pt c/o cough and congestion x 3 months. Afebrile. Finished antibiotic in May but did not take prednisone because of an intolerance. She also c/o intermittent neck pain.

## 2015-01-12 ENCOUNTER — Telehealth: Payer: Self-pay | Admitting: Emergency Medicine

## 2015-01-21 ENCOUNTER — Emergency Department (INDEPENDENT_AMBULATORY_CARE_PROVIDER_SITE_OTHER)
Admission: EM | Admit: 2015-01-21 | Discharge: 2015-01-21 | Disposition: A | Discharge status: Home / Self Care | Payer: BLUE CROSS/BLUE SHIELD | Source: Home / Self Care

## 2015-01-21 ENCOUNTER — Encounter: Payer: Self-pay | Admitting: Emergency Medicine

## 2015-01-21 DIAGNOSIS — M545 Low back pain, unspecified: Secondary | ICD-10-CM

## 2015-01-21 DIAGNOSIS — M549 Dorsalgia, unspecified: Secondary | ICD-10-CM

## 2015-01-21 DIAGNOSIS — M546 Pain in thoracic spine: Secondary | ICD-10-CM | POA: Diagnosis not present

## 2015-01-21 DIAGNOSIS — M62830 Muscle spasm of back: Secondary | ICD-10-CM | POA: Diagnosis not present

## 2015-01-21 MED ORDER — METHOCARBAMOL 500 MG PO TABS: 500.0000 mg | ORAL_TABLET | Freq: Two times a day (BID) | ORAL | Status: DC

## 2015-01-21 MED ORDER — MELOXICAM 7.5 MG PO TABS: 7.5000 mg | ORAL_TABLET | Freq: Every day | ORAL | Status: DC

## 2015-01-21 NOTE — ED Notes (Signed)
Pt c/o lower back pain that worsened after lifting heavy boxes at work this week.

## 2015-01-21 NOTE — ED Provider Notes (Signed)
CSN: [id number]     Arrival date & time 01/21/15  1853 History   None    Chief Complaint  Patient presents with  . 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  Today patient is requesting a work note as she states she has lower back pain that is worse on the left side.  Constant achy and sore worse with certain movements and palpation.  She has tried ibuprofen without relief. Denies history of back surgeries.  Denies numbness or tingling in arms or legs.  Denies change in bowel or bladder habits.  Denies history of cancer or IV drug use.  Patient states she initially hurt her lower back about 2 years ago at work and was advised she should never lift over 30 pounds again.  Patient states she saw Dr. Julio Sicks at this urgent care and was prescribed Flexeril as well as Vicodin.  Patient states she is now out of her Vicodin and would like more, but states that Flexeril does not work.  Patient states she was seeing a family doctor at the time of her initial accident but states she does not like that provider and will never see him again.  Patient states she has never followed up with an orthopedist or neurosurgeon.  Denies history of back surgeries or injections.  Past Medical History  Diagnosis Date  . GERD (gastroesophageal reflux disease)   . Panic attack   . High cholesterol   . Depression   . PCOS (polycystic ovarian syndrome)    Past Surgical History  Procedure Laterality Date  . Tonsillectomy    . Knee surgery    . Wisdom tooth extraction     Family History  Problem Relation Age of Onset  . Hypertension Mother   . Thyroid disease Mother   . GER disease Father   . Migraines Father   . Thyroid disease Brother   . Heart attack Other     History  Substance Use Topics  . Smoking status: Current Every Day Smoker -- 1.00 packs/day for 15 years    Types: Cigarettes  . Smokeless tobacco: Never Used     Comment: using vapes  . Alcohol Use: Yes   OB History    No data available     Review of Systems  Constitutional: Negative for fever and chills.  Respiratory: Negative for cough and shortness of breath.   Cardiovascular: Negative for chest pain and palpitations.  Gastrointestinal: Negative for nausea, vomiting, abdominal pain and diarrhea.  Genitourinary: Negative for dysuria, urgency, hematuria and flank pain.  Musculoskeletal: Positive for myalgias and back pain. Negative for joint swelling, arthralgias, gait problem, neck pain and neck stiffness.  Skin: Negative for color change, pallor, rash and wound.  Neurological: Negative for weakness and numbness.  All other systems reviewed and are negative.   Allergies  Celexa; Ibuprofen; Prilosec; Wellbutrin; and Flexeril  Home Medications   Prior to Admission medications   Medication Sig Start Date End Date Taking? Authorizing Provider  albuterol (PROVENTIL HFA;VENTOLIN HFA) 108 (90 BASE) MCG/ACT inhaler Inhale 1-2 puffs into the lungs every 6 (six) hours as needed for wheezing or shortness of breath. 07/08/14   Fransico Meadow, PA-C  alum & mag hydroxide-simeth (MAALOX/MYLANTA) 200-200-20 MG/5ML suspension Take 10 mLs by mouth every 6 (six) hours  as needed. Patient used this medication for heartburn.    Historical Provider, MD  baclofen (LIORESAL) 10 MG tablet Take 1 tablet (10 mg total) by mouth 3 (three) times daily. 06/01/14   Jade L Breeback, PA-C  benzonatate (TESSALON) 200 MG capsule Take 1 capsule (200 mg total) by mouth at bedtime. Take as needed for cough 01/11/15   Kandra Nicolas, MD  meloxicam (MOBIC) 15 MG tablet One tab PO qAM with breakfast for 2 weeks, then daily prn pain. 06/01/14   Jade L Breeback, PA-C  meloxicam (MOBIC) 7.5 MG tablet Take 1 tablet (7.5 mg  total) by mouth daily. 01/21/15   Noland Fordyce, PA-C  methocarbamol (ROBAXIN) 500 MG tablet Take 1 tablet (500 mg total) by mouth 2 (two) times daily. 01/21/15   Noland Fordyce, PA-C  naproxen sodium (ANAPROX) 220 MG tablet Take 440 mg by mouth 2 (two) times daily with a meal. Patient used this medication for pain.    Historical Provider, MD  omeprazole (PRILOSEC) 40 MG capsule Take 1 capsule (40 mg total) by mouth 2 (two) times daily. Take 30 minutes before a meal. 01/11/15   Kandra Nicolas, MD  Ranitidine HCl (ZANTAC PO) Take 1 tablet by mouth daily as needed. Patient uses this medication for heartburn.    Historical Provider, MD  simethicone (MYLICON) 660 MG chewable tablet Chew 125 mg by mouth every 6 (six) hours as needed. Patient used this medication for flatulence.    Historical Provider, MD   BP 135/84 mmHg  Pulse 60  Temp(Src) 98.2 F (36.8 C) (Oral)  SpO2 97% Physical Exam  Constitutional: She is oriented to person, place, and time. She appears well-developed and well-nourished.  Morbidly obese female sitting on exam table, NAD.  HENT:  Head: Normocephalic and atraumatic.  Eyes: EOM are normal.  Neck: Normal range of motion. Neck supple.  No midline bone tenderness, no crepitus or step-offs. FROM w/o pain.  Cardiovascular: Normal rate, regular rhythm and normal heart sounds.   Pulmonary/Chest: Effort normal. No respiratory distress.  Musculoskeletal: Normal range of motion. She exhibits tenderness. She exhibits no edema.  No midline spinal tenderness. Exquisite tenderness to Right thoracic muscle with minimal palpation. Tenderness to Left lumbar muscles. No bony tenderness. FROM upper and lower extremities with 5/5 strength.  Neurological: She is alert and oriented to person, place, and time.  Normal gait  Skin: Skin is warm and dry. No erythema.  Skin in tact, no erythema or ecchymosis.  Psychiatric: She has a normal mood and affect. Her behavior is normal.  Nursing note and  vitals reviewed.   ED Course  Procedures (including critical care time) Labs Review Labs Reviewed - No data to display  Imaging Review No results found.   MDM   1. Spasm of back muscles   2. Bilateral low back pain without sciatica   3. Upper back pain    Patient presenting to urgent care with complaint of exacerbation of lower back pain at work lifting heavy boxes.  No red flag symptoms.  Patient requesting Vicodin and work note.  Patient states she is not filing for [employer] as she states she never filed for [employer] during the initial incident over 2 years ago.  Discussed with patient that this could be considered a new injury given the duration of time between initial incident and the fact that she was "forced" to lift more than was medically advised by a primer PCP.  Patient still declines to file Worker's  Comp.  Review of pt's medical records, no prior notes on "back injury" but pt was seen in 2014 for a 'Rib contusion' and 'Rib pain' on the Left side for which she received vicodin.   On exam, patient became very angry and verbally abusive after this provider was examining her back as she had significant pain with light touch to right thoracic paraspinal muscles.  Apologized to pt, advised it was part of the routine examine to ensure she did not have any spine tenderness that may warrant imaging.  Discussed getting imaging due to patient's significant pain with minimal touch.  Patient angrily replied "No!" stating she did not want any imaging as she has had in the past and it is always negative.  Patient declines going to the emergency department for further evaluation.  Strongly encouraged pt to establish care with a PCP and to f/u with neurosurgery or orthopedics as pt has significant pain with light touch.  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 Strongly advised against heavy lifting. Work note provided stating pt can return to work on 01/22/15,  strongly advised against heavy lifting >25 lbs and no operating heavy machinery for 5 days. Rx: robaxin and meloxicam.   Do not feel comfortable prescribing narcotics as pt does not appear she will f/u with appropriate care.  Concern for potential misuse/abuse of narcotic medications without appropriate f/u.     Noland Fordyce, PA-C 01/22/15 [phone]

## 2015-01-21 NOTE — Discharge Instructions (Signed)
It is very important to establish care with a primary care provider is strongly encouraged a follow-up with sports medicine provider or orthopedist for further evaluation of severe back pain.  Strongly encourage you not to do any heavy lifting or sudden movements, as this may exacerbate her back pain.  If pain continues to worsen, you develope numbness or tingling in arms, legs, or groin, or changes in bowel or bladder habits, pse go to emergency department for further evaluation and treatment.  Robaxin is a muscle relaxer and may cause drowsiness. Do not drink alcohol, drive, or operate heavy machinery while taking. Meloxicam (Mobic) is an antiinflammatory to help with pain and inflammation.  Do not take ibuprofen, Advil, Aleve, or any other medications that contain NSAIDs while taking meloxicam as this may cause stomach upset or even ulcers if taken in large amounts for an extended period of time.   See below for further instructions.

## 2015-02-22 ENCOUNTER — Emergency Department (INDEPENDENT_AMBULATORY_CARE_PROVIDER_SITE_OTHER): Payer: BLUE CROSS/BLUE SHIELD

## 2015-02-22 ENCOUNTER — Emergency Department (INDEPENDENT_AMBULATORY_CARE_PROVIDER_SITE_OTHER)
Admission: EM | Admit: 2015-02-22 | Discharge: 2015-02-22 | Disposition: A | Discharge status: Home / Self Care | Payer: BLUE CROSS/BLUE SHIELD | Source: Home / Self Care | Attending: Family Medicine | Admitting: Family Medicine

## 2015-02-22 ENCOUNTER — Encounter: Payer: Self-pay | Admitting: Emergency Medicine

## 2015-02-22 DIAGNOSIS — S161XXA Strain of muscle, fascia and tendon at neck level, initial encounter: Secondary | ICD-10-CM

## 2015-02-22 DIAGNOSIS — M542 Cervicalgia: Secondary | ICD-10-CM

## 2015-02-22 MED ORDER — METHOCARBAMOL 500 MG PO TABS: ORAL_TABLET | ORAL | Status: DC

## 2015-02-22 MED ORDER — TRIAMCINOLONE ACETONIDE 40 MG/ML IJ SUSP
40.0000 mg | Freq: Once | INTRAMUSCULAR | Status: AC
  Administered 2015-02-22: 40 mg via INTRAMUSCULAR

## 2015-02-22 MED ORDER — HYDROCODONE-ACETAMINOPHEN 5-325 MG PO TABS: ORAL_TABLET | ORAL | Status: DC

## 2015-02-22 NOTE — ED Notes (Signed)
Pt c/o cough, stuffy nose.  When she coughs, pain on the right radiates down shoulder, arm to hand.

## 2015-02-22 NOTE — Discharge Instructions (Signed)
Apply ice pack for 20 to 30 minutes, 3 to 4 times daily  Continue until pain decreases.    Cervical Sprain A cervical sprain is an injury in the neck in which the strong, fibrous tissues (ligaments) that connect your neck bones stretch or tear. Cervical sprains can range from mild to severe. Severe cervical sprains can cause the neck vertebrae to be unstable. This can lead to damage of the spinal cord and can result in serious nervous system problems. The amount of time it takes for a cervical sprain to get better depends on the cause and extent of the injury. Most cervical sprains heal in 1 to 3 weeks. CAUSES  Severe cervical sprains may be caused by:   Contact sport injuries (such as from football, rugby, wrestling, hockey, auto racing, gymnastics, diving, martial arts, or boxing).   Motor vehicle collisions.   Whiplash injuries. This is an injury from a sudden forward and backward whipping movement of the head and neck.  Falls.  Mild cervical sprains may be caused by:   Being in an awkward position, such as while cradling a telephone between your ear and shoulder.   Sitting in a chair that does not offer proper support.   Working at a poorly Landscape architect station.   Looking up or down for long periods of time.  SYMPTOMS   Pain, soreness, stiffness, or a burning sensation in the front, back, or sides of the neck. This discomfort may develop immediately after the injury or slowly, 24 hours or more after the injury.   Pain or tenderness directly in the middle of the back of the neck.   Shoulder or upper back pain.   Limited ability to move the neck.   Headache.   Dizziness.   Weakness, numbness, or tingling in the hands or arms.   Muscle spasms.   Difficulty swallowing or chewing.   Tenderness and swelling of the neck.  DIAGNOSIS  Most of the time your health care provider can diagnose a cervical sprain by taking your history and doing a physical  exam. Your health care provider will ask about previous neck injuries and any known neck problems, such as arthritis in the neck. X-rays may be taken to find out if there are any other problems, such as with the bones of the neck. Other tests, such as a CT scan or MRI, may also be needed.  TREATMENT  Treatment depends on the severity of the cervical sprain. Mild sprains can be treated with rest, keeping the neck in place (immobilization), and pain medicines. Severe cervical sprains are immediately immobilized. Further treatment is done to help with pain, muscle spasms, and other symptoms and may include:  Medicines, such as pain relievers, numbing medicines, or muscle relaxants.   Physical therapy. This may involve stretching exercises, strengthening exercises, and posture training. Exercises and improved posture can help stabilize the neck, strengthen muscles, and help stop symptoms from returning.  HOME CARE INSTRUCTIONS   Put ice on the injured area.   Put ice in a plastic bag.   Place a towel between your skin and the bag.   Leave the ice on for 15-20 minutes, 3-4 times a day.   If your injury was severe, you may have been given a cervical collar to wear. A cervical collar is a two-piece collar designed to keep your neck from moving while it heals.  Do not remove the collar unless instructed by your health care provider.  If you have long  hair, keep it outside of the collar.  Ask your health care provider before making any adjustments to your collar. Minor adjustments may be required over time to improve comfort and reduce pressure on your chin or on the back of your head.  Ifyou are allowed to remove the collar for cleaning or bathing, follow your health care provider's instructions on how to do so safely.  Keep your collar clean by wiping it with mild soap and water and drying it completely. If the collar you have been given includes removable pads, remove them every 1-2 days  and hand wash them with soap and water. Allow them to air dry. They should be completely dry before you wear them in the collar.  If you are allowed to remove the collar for cleaning and bathing, wash and dry the skin of your neck. Check your skin for irritation or sores. If you see any, tell your health care provider.  Do not drive while wearing the collar.   Only take over-the-counter or prescription medicines for pain, discomfort, or fever as directed by your health care provider.   Keep all follow-up appointments as directed by your health care provider.   Keep all physical therapy appointments as directed by your health care provider.   Make any needed adjustments to your workstation to promote good posture.   Avoid positions and activities that make your symptoms worse.   Warm up and stretch before being active to help prevent problems.  SEEK MEDICAL CARE IF:   Your pain is not controlled with medicine.   You are unable to decrease your pain medicine over time as planned.   Your activity level is not improving as expected.  SEEK IMMEDIATE MEDICAL CARE IF:   You develop any bleeding.  You develop stomach upset.  You have signs of an allergic reaction to your medicine.   Your symptoms get worse.   You develop new, unexplained symptoms.   You have numbness, tingling, weakness, or paralysis in any part of your body.  MAKE SURE YOU:   Understand these instructions.  Will watch your condition.  Will get help right away if you are not doing well or get worse. Document Released: 04/08/2007 Document Revised: 06/16/2013 Document Reviewed: 12/17/2012 [facility] Patient Information 2015 [place], [place]. This information is not intended to replace advice given to you by your health care provider. Make sure you discuss any questions you have with your health care provider.

## 2015-02-22 NOTE — ED Provider Notes (Signed)
CSN: [id number]     Arrival date & time 02/22/15  1907 History   First MD Initiated Contact with Patient 02/22/15 1915     Chief Complaint  Patient presents with  . Cough      HPI Comments: Patient reports that she choked on some food about four days, resulting in vigorous coughing for about 45 seconds.  The next day she developed a lancinating pain in her right shoulder to her right hand whenever she coughs or sneezes.  No shortness of breath or chest pain.  She states that her neck has been sore for about a month.  She recalls no neck injuries.  No loss of strength in her right arm. 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  She states that over the past two months her bags have weighed between 25 to 40 pounds and she usually throws with one arm or the other (she is left-handed).  She notes that her neck has been more sore during this time interval.  The history is provided by the patient.    Past Medical History  Diagnosis Date  . GERD (gastroesophageal reflux disease)   . Panic attack   . High cholesterol   . Depression   . PCOS (polycystic ovarian syndrome)    Past Surgical History  Procedure Laterality Date  . Tonsillectomy    . Knee surgery    . Wisdom tooth extraction     Family History  Problem Relation Age of Onset  . Hypertension Mother   . Thyroid disease Mother   . GER disease Father   . Migraines Father   . Thyroid disease Brother   . Heart attack Other    Social History  Substance Use Topics  . Smoking status: Current Every Day Smoker -- 1.00 packs/day for 15 years    Types: Cigarettes  . Smokeless tobacco: Never Used     Comment: using vapes  . Alcohol Use: Yes   OB History    No data available     Review of Systems  Constitutional: Negative for fever, chills and fatigue.  Eyes: Negative.   Respiratory: Positive for cough. Negative for shortness of breath and wheezing.   Cardiovascular: Negative.   Gastrointestinal:  Negative for abdominal pain.  Genitourinary: Negative.   Musculoskeletal:       Right arm pain with cough/sneeze  Skin: Negative.   Neurological: Negative for numbness.    Allergies  Celexa; Ibuprofen; Prednisone; Prilosec; Wellbutrin; and Flexeril  Home Medications   Prior to Admission medications   Medication Sig Start Date End Date Taking? Authorizing Provider  HYDROcodone-acetaminophen (NORCO/VICODIN) 5-325 MG per tablet Take one by mouth at bedtime as needed for pain 02/22/15   Kandra Nicolas, MD  methocarbamol (ROBAXIN) 500 MG tablet Take one or two tabs QID as needed for muscle spasm 02/22/15   Kandra Nicolas, MD   Meds Ordered and Administered this Visit   Medications  triamcinolone acetonide (KENALOG-40) injection 40 mg (40 mg Intramuscular Given 02/22/15 2028)    BP 109/75 mmHg  Pulse 61  Temp(Src) 98.3 F (36.8 C) (Oral)  Ht 5\' 9"  (1.753 m)  Wt 297 lb (134.718 kg)  BMI 43.84 kg/m2  SpO2 94%  LMP 02/15/2015 No data found.   Physical Exam  Constitutional: She is oriented to person, place, and time. She appears well-developed and well-nourished. No distress.  Patient is obese (BMI 43.8)  HENT:  Head: Normocephalic.  Nose: Nose normal.  Mouth/Throat: Oropharynx is clear and moist.  Eyes: Conjunctivae are normal. Pupils are equal, round, and reactive to light.  Neck: Normal range of motion.  No tenderness to palpation   Cardiovascular: Normal heart sounds.   Pulmonary/Chest: Breath sounds normal. She has no wheezes.  Abdominal: There is no tenderness.  Musculoskeletal:       Right shoulder: She exhibits decreased range of motion and tenderness. She exhibits no swelling, no crepitus and normal pulse.  Right shoulder has relatively good range of motion, although she has difficulty performing Apley's test.  There is mild tenderness to palpation over the insertion of the long head of biceps tendon.  Distal neurovascular function is intact.   Lymphadenopathy:     She has no cervical adenopathy.  Neurological: She is alert and oriented to person, place, and time.  Skin: Skin is warm and dry. No rash noted.  Nursing note and vitals reviewed.   ED Course  Procedures  None   Imaging Review Dg Cervical Spine Complete  02/22/2015   CLINICAL DATA:  Neck stiffness and right arm pain for 3 days. No acute injury or prior relevant surgery. Initial encounter.  EXAM: CERVICAL SPINE  4+ VIEWS  COMPARISON:  None.  FINDINGS: There is a gradual cervical kyphosis without focal angulation or listhesis. No prevertebral soft tissue swelling identified. The disc spaces are maintained. There is no evidence of acute fracture or osseous foraminal stenosis. The C1-2 articulation appears normal in the AP projection.  IMPRESSION: Reversal of lordosis may be positional or secondary to muscle spasm. No acute osseous findings or osseous foraminal stenosis.   Electronically Signed   By: Richardean Sale M.D.   On: 02/22/2015 20:04     MDM   1. Cervical strain, acute, initial encounter; radicular symptoms in right arm are suggestive of a "stinger" (brachial plexus injury)    Kenalog 40mg  IM (patient does not tolerate oral prednisone) Begin Robaxin 500mg , 1 or 2 QID.  Lortab at bedtime prn. Apply ice pack for 20 to 30 minutes, 3 to 4 times daily  Continue until pain decreases. Out of work for two days. Followup with Dr. Aundria Mems or Dr. Lynne Leader ([facility]) in about 6 days.     Kandra Nicolas, MD 02/22/15 [phone]

## 2015-02-24 ENCOUNTER — Telehealth: Payer: Self-pay

## 2015-02-25 ENCOUNTER — Ambulatory Visit (INDEPENDENT_AMBULATORY_CARE_PROVIDER_SITE_OTHER): Payer: BLUE CROSS/BLUE SHIELD | Admitting: Family Medicine

## 2015-02-25 ENCOUNTER — Encounter: Payer: Self-pay | Admitting: Family Medicine

## 2015-02-25 VITALS — BP 136/83 | HR 60 | Ht 69.0 in | Wt 299.0 lb

## 2015-02-25 DIAGNOSIS — M5412 Radiculopathy, cervical region: Secondary | ICD-10-CM

## 2015-02-25 DIAGNOSIS — S161XXA Strain of muscle, fascia and tendon at neck level, initial encounter: Secondary | ICD-10-CM | POA: Insufficient documentation

## 2015-02-25 MED ORDER — METHYLPREDNISOLONE 4 MG PO TBPK: ORAL_TABLET | ORAL | Status: DC

## 2015-02-25 NOTE — Assessment & Plan Note (Signed)
Symptoms most consistent with cervical radiculopathy. I am significantly concerned because patient has developed weakness into her right hand. We'll start a trial of Medrol Dosepak as well as obtain a MRI ASAP. We'll start physical therapy if MRI does not show large disc herniation. Discussed in detail red flag precautions with patient. Work note provided

## 2015-02-25 NOTE — Progress Notes (Signed)
   Subjective:    I'm seeing this patient as a consultation for:  Dr. Assunta Found  CC: Right arm pain  HPI: Patient coughed aggressively for about a minute a few days ago. However when she did this she developed neck pain. She is subsequently has developed a tingling pain and weakness radiating to her right arm and into her hand. She notes significant pain into the right second through fourth digits in her hand as well as soreness along the ulnar aspect of her forearm. She notes some tingling and subjective numbness as well. However she does note weakness in grip and hand motion. She notes mild lateral neck pain however the majority of her pain is sudden to her arm. She was seen in urgent care on the 30th where x-rays were normal and she was treated with baclofen and Norco. She notes she is intolerant to prednisone as it makes her jittery.   Past medical history, Surgical history, Family history not pertinant except as noted below, Social history, Allergies, and medications have been entered into the medical record, reviewed, and no changes needed.   Review of Systems: No headache, visual changes, nausea, vomiting, diarrhea, constipation, dizziness, abdominal pain, skin rash, fevers, chills, night sweats, weight loss, swollen lymph nodes, body aches, joint swelling, muscle aches, chest pain, shortness of breath, mood changes, visual or auditory hallucinations.   Objective:    Filed Vitals:   02/25/15 1117  BP: 136/83  Pulse: 60   General: Well Developed, well nourished, and in no acute distress.  Neuro/Psych: Alert and oriented x3, extra-ocular muscles intact, able to move all 4 extremities, sensation grossly intact. Skin: Warm and dry, no rashes noted.  Respiratory: Not using accessory muscles, speaking in full sentences, trachea midline.  Cardiovascular: Pulses palpable, no extremity edema. Abdomen: Does not appear distended. MSK: Neck: Nontender to midline normal neck range of motion positive  right-sided Spurling's test. Deltoid strength is equal and normal bilaterally. Biceps strength is equal and normal bilaterally. Triceps strength 4+/5 right 55/left Grip strength 4/5 right 5/5 left Finger abduction 4/5 right 5/5 left Sensation is intact. Pulses capillary refill are intact  C-spine images from August 30 reviewed below CLINICAL DATA: Neck stiffness and right arm pain for 3 days. No acute injury or prior relevant surgery. Initial encounter.  EXAM: CERVICAL SPINE 4+ VIEWS  COMPARISON: None.  FINDINGS: There is a gradual cervical kyphosis without focal angulation or listhesis. No prevertebral soft tissue swelling identified. The disc spaces are maintained. There is no evidence of acute fracture or osseous foraminal stenosis. The C1-2 articulation appears normal in the AP projection.  IMPRESSION: Reversal of lordosis may be positional or secondary to muscle spasm. No acute osseous findings or osseous foraminal stenosis.   Electronically Signed  By: Richardean Sale M.D.  On: 02/22/2015 20:04 No results found for this or any previous visit (from the past 24 hour(s)). No results found.  Impression and Recommendations:   This case required medical decision making of moderate complexity.

## 2015-02-25 NOTE — Patient Instructions (Signed)
Thank you for coming in today. Try the medrol dosepack.  Stop if you have intolerable prednisone like side effects. Keep by your phone. We will call you with when your MRI is. It will probably be Monday  Come back or go to the emergency room if you notice new weakness new numbness problems walking or bowel or bladder problems.  Cervical Radiculopathy Cervical radiculopathy happens when a nerve in the neck is pinched or bruised by a slipped (herniated) disk or by arthritic changes in the bones of the cervical spine. This can occur due to an injury or as part of the normal aging process. Pressure on the cervical nerves can cause pain or numbness that runs from your neck all the way down into your arm and fingers. CAUSES  There are many possible causes, including:  Injury.  Muscle tightness in the neck from overuse.  Swollen, painful joints (arthritis).  Breakdown or degeneration in the bones and joints of the spine (spondylosis) due to aging.  Bone spurs that may develop near the cervical nerves. SYMPTOMS  Symptoms include pain, weakness, or numbness in the affected arm and hand. Pain can be severe or irritating. Symptoms may be worse when extending or turning the neck. DIAGNOSIS  Your caregiver will ask about your symptoms and do a physical exam. He or she may test your strength and reflexes. X-rays, CT scans, and MRI scans may be needed in cases of injury or if the symptoms do not go away after a period of time. Electromyography (EMG) or nerve conduction testing may be done to study how your nerves and muscles are working. TREATMENT  Your caregiver may recommend certain exercises to help relieve your symptoms. Cervical radiculopathy can, and often does, get better with time and treatment. If your problems continue, treatment options may include:  Wearing a soft collar for short periods of time.  Physical therapy to strengthen the neck muscles.  Medicines, such as nonsteroidal  anti-inflammatory drugs (NSAIDs), oral corticosteroids, or spinal injections.  Surgery. Different types of surgery may be done depending on the cause of your problems. HOME CARE INSTRUCTIONS   Put ice on the affected area.  Put ice in a plastic bag.  Place a towel between your skin and the bag.  Leave the ice on for 15-20 minutes, 03-04 times a day or as directed by your caregiver.  If ice does not help, you can try using heat. Take a warm shower or bath, or use a hot water bottle as directed by your caregiver.  You may try a gentle neck and shoulder massage.  Use a flat pillow when you sleep.  Only take over-the-counter or prescription medicines for pain, discomfort, or fever as directed by your caregiver.  If physical therapy was prescribed, follow your caregiver's directions.  If a soft collar was prescribed, use it as directed. SEEK IMMEDIATE MEDICAL CARE IF:   Your pain gets much worse and cannot be controlled with medicines.  You have weakness or numbness in your hand, arm, face, or leg.  You have a high fever or a stiff, rigid neck.  You lose bowel or bladder control (incontinence).  You have trouble with walking, balance, or speaking. MAKE SURE YOU:   Understand these instructions.  Will watch your condition.  Will get help right away if you are not doing well or get worse. Document Released: 03/06/2001 Document Revised: 09/03/2011 Document Reviewed: 01/23/2011 [facility] Patient Information 2015 [place], [place]. This information is not intended to replace advice given  to you by your health care provider. Make sure you discuss any questions you have with your health care provider.  

## 2015-02-26 ENCOUNTER — Ambulatory Visit (HOSPITAL_BASED_OUTPATIENT_CLINIC_OR_DEPARTMENT_OTHER)
Admission: RE | Admit: 2015-02-26 | Discharge: 2015-02-26 | Disposition: A | Discharge status: Home / Self Care | Payer: BLUE CROSS/BLUE SHIELD | Source: Ambulatory Visit | Attending: Family Medicine | Admitting: Family Medicine

## 2015-02-26 DIAGNOSIS — M5412 Radiculopathy, cervical region: Secondary | ICD-10-CM | POA: Insufficient documentation

## 2015-02-26 DIAGNOSIS — R531 Weakness: Secondary | ICD-10-CM | POA: Insufficient documentation

## 2015-02-26 DIAGNOSIS — M79601 Pain in right arm: Secondary | ICD-10-CM | POA: Insufficient documentation

## 2015-02-28 NOTE — Progress Notes (Signed)
Quick Note:  Neck MRI looked good. No serious spinal cord problem. If symptoms continue >6 weeks we will do a nerve conduction study. Return after medrol dosepack is done if not better. ______

## 2015-03-10 ENCOUNTER — Encounter: Payer: Self-pay | Admitting: Family Medicine

## 2015-03-10 ENCOUNTER — Ambulatory Visit (INDEPENDENT_AMBULATORY_CARE_PROVIDER_SITE_OTHER): Payer: BLUE CROSS/BLUE SHIELD | Admitting: Family Medicine

## 2015-03-10 VITALS — BP 134/83 | HR 60 | Ht 69.0 in | Wt 300.0 lb

## 2015-03-10 DIAGNOSIS — R635 Abnormal weight gain: Secondary | ICD-10-CM

## 2015-03-10 DIAGNOSIS — S161XXA Strain of muscle, fascia and tendon at neck level, initial encounter: Secondary | ICD-10-CM

## 2015-03-10 DIAGNOSIS — M5412 Radiculopathy, cervical region: Secondary | ICD-10-CM

## 2015-03-10 MED ORDER — PHENTERMINE HCL 37.5 MG PO CAPS: ORAL_CAPSULE | ORAL | Status: DC

## 2015-03-10 MED ORDER — HYDROCODONE-ACETAMINOPHEN 5-325 MG PO TABS: ORAL_TABLET | ORAL | Status: DC

## 2015-03-10 NOTE — Assessment & Plan Note (Signed)
Symptoms which were consistent with cervical strain. Plan for physical therapy Robaxin and Norco. MRI reviewed and was reassuring.

## 2015-03-10 NOTE — Assessment & Plan Note (Signed)
Start phentermine recheck in one month

## 2015-03-10 NOTE — Patient Instructions (Signed)
Thank you for coming in today. Return in one month Do not get pregnant will taking phentermine Go to Physical therapy Come back or go to the emergency room if you notice new weakness new numbness problems walking or bowel or bladder problems.   Calorie Counting for Weight Loss Calories are energy you get from the things you eat and drink. Your body uses this energy to keep you going throughout the day. The number of calories you eat affects your weight. When you eat more calories than your body needs, your body stores the extra calories as fat. When you eat fewer calories than your body needs, your body burns fat to get the energy it needs. Calorie counting means keeping track of how many calories you eat and drink each day. If you make sure to eat fewer calories than your body needs, you should lose weight. In order for calorie counting to work, you will need to eat the number of calories that are right for you in a day to lose a healthy amount of weight per week. A healthy amount of weight to lose per week is usually 1-2 lb (0.5-0.9 kg). A dietitian can determine how many calories you need in a day and give you suggestions on how to reach your calorie goal.  WHAT IS MY MY PLAN? My goal is to have __________ calories per day.  If I have this many calories per day, I should lose around __________ pounds per week. WHAT DO I NEED TO KNOW ABOUT CALORIE COUNTING? In order to meet your daily calorie goal, you will need to:  Find out how many calories are in each food you would like to eat. Try to do this before you eat.  Decide how much of the food you can eat.  Write down what you ate and how many calories it had. Doing this is called keeping a food log. WHERE DO I FIND CALORIE INFORMATION? The number of calories in a food can be found on a Nutrition Facts label. Note that all the information on a label is based on a specific serving of the food. If a food does not have a Nutrition Facts label, try  to look up the calories online or ask your dietitian for help. HOW DO I DECIDE HOW MUCH TO EAT? To decide how much of the food you can eat, you will need to consider both the number of calories in one serving and the size of one serving. This information can be found on the Nutrition Facts label. If a food does not have a Nutrition Facts label, look up the information online or ask your dietitian for help. Remember that calories are listed per serving. If you choose to have more than one serving of a food, you will have to multiply the calories per serving by the amount of servings you plan to eat. For example, the label on a package of bread might say that a serving size is 1 slice and that there are 90 calories in a serving. If you eat 1 slice, you will have eaten 90 calories. If you eat 2 slices, you will have eaten 180 calories. HOW DO I KEEP A FOOD LOG? After each meal, record the following information in your food log:  What you ate.  How much of it you ate.  How many calories it had.  Then, add up your calories. Keep your food log near you, such as in a small notebook in your pocket. Another option  is to use a mobile app or website. Some programs will calculate calories for you and show you how many calories you have left each time you add an item to the log. WHAT ARE SOME CALORIE COUNTING TIPS?  Use your calories on foods and drinks that will fill you up and not leave you hungry. Some examples of this include foods like nuts and nut butters, vegetables, lean proteins, and high-fiber foods (more than 5 g fiber per serving).  Eat nutritious foods and avoid empty calories. Empty calories are calories you get from foods or beverages that do not have many nutrients, such as candy and soda. It is better to have a nutritious high-calorie food (such as an avocado) than a food with few nutrients (such as a bag of chips).  Know how many calories are in the foods you eat most often. This way, you  do not have to look up how many calories they have each time you eat them.  Look out for foods that may seem like low-calorie foods but are really high-calorie foods, such as baked goods, soda, and fat-free candy.  Pay attention to calories in drinks. Drinks such as sodas, specialty coffee drinks, alcohol, and juices have a lot of calories yet do not fill you up. Choose low-calorie drinks like water and diet drinks.  Focus your calorie counting efforts on higher calorie items. Logging the calories in a garden salad that contains only vegetables is less important than calculating the calories in a milk shake.  Find a way of tracking calories that works for you. Get creative. Most people who are successful find ways to keep track of how much they eat in a day, even if they do not count every calorie. WHAT ARE SOME PORTION CONTROL TIPS?  Know how many calories are in a serving. This will help you know how many servings of a certain food you can have.  Use a measuring cup to measure serving sizes. This is helpful when you start out. With time, you will be able to estimate serving sizes for some foods.  Take some time to put servings of different foods on your favorite plates, bowls, and cups so you know what a serving looks like.  Try not to eat straight from a bag or box. Doing this can lead to overeating. Put the amount you would like to eat in a cup or on a plate to make sure you are eating the right portion.  Use smaller plates, glasses, and bowls to prevent overeating. This is a quick and easy way to practice portion control. If your plate is smaller, less food can fit on it.  Try not to multitask while eating, such as watching TV or using your computer. If it is time to eat, sit down at a table and enjoy your food. Doing this will help you to start recognizing when you are full. It will also make you more aware of what and how much you are eating. HOW CAN I CALORIE COUNT WHEN EATING  OUT?  Ask for smaller portion sizes or child-sized portions.  Consider sharing an entree and sides instead of getting your own entree.  If you get your own entree, eat only half. Ask for a box at the beginning of your meal and put the rest of your entree in it so you are not tempted to eat it.  Look for the calories on the menu. If calories are listed, choose the lower calorie options.  Choose  dishes that include vegetables, fruits, whole grains, low-fat dairy products, and lean protein. Focusing on smart food choices from each of the 5 food groups can help you stay on track at restaurants.  Choose items that are boiled, broiled, grilled, or steamed.  Choose water, milk, unsweetened iced tea, or other drinks without added sugars. If you want an alcoholic beverage, choose a lower calorie option. For example, a regular margarita can have up to 700 calories and a glass of wine has around 150.  Stay away from items that are buttered, battered, fried, or served with cream sauce. Items labeled "crispy" are usually fried, unless stated otherwise.  Ask for dressings, sauces, and syrups on the side. These are usually very high in calories, so do not eat much of them.  Watch out for salads. Many people think salads are a healthy option, but this is often not the case. Many salads come with bacon, fried chicken, lots of cheese, fried chips, and dressing. All of these items have a lot of calories. If you want a salad, choose a garden salad and ask for grilled meats or steak. Ask for the dressing on the side, or ask for olive oil and vinegar or lemon to use as dressing.  Estimate how many servings of a food you are given. For example, a serving of cooked rice is  cup or about the size of half a tennis ball or one cupcake wrapper. Knowing serving sizes will help you be aware of how much food you are eating at restaurants. The list below tells you how big or small some common portion sizes are based on  everyday objects.  1 oz--4 stacked dice.  3 oz--1 deck of cards.  1 tsp--1 dice.  1 Tbsp-- a Ping-Pong ball.  2 Tbsp--1 Ping-Pong ball.   cup--1 tennis ball or 1 cupcake wrapper.  1 cup--1 baseball. Document Released: 06/11/2005 Document Revised: 10/26/2013 Document Reviewed: 04/16/2013 [facility] Patient Information 2015 [place], [place]. This information is not intended to replace advice given to you by your health care provider. Make sure you discuss any questions you have with your health care provider.

## 2015-03-10 NOTE — Progress Notes (Signed)
Ariana Parks is a 34 y.o. female who presents to [facility]  today for neck pain. Patient was seen on September 2 for neck pain and right arm weakness. She was thought to have cervical radiculopathy. Because of the weakness and MRI was obtained. MRI did not show significant explanation for her radicular symptoms to her right arm. Fortunately her symptoms have subsequently resolved into her arm. She denies any weakness or tingling or pain down her arm. She does continue to have right lateral neck pain. Pain is worse with motion. She has methocarbamol is very helpful. She has run out of hydrocodone. She notes that she has not been to physical therapy for this particular problem yet.  Additionally patient notes that she's tried to lose weight in the past and had difficulty with simple diet and exercise. She is interested in starting phentermine today. No chest pains palpitations shortness of breath.   Past Medical History  Diagnosis Date  . GERD (gastroesophageal reflux disease)   . Panic attack   . High cholesterol   . Depression   . PCOS (polycystic ovarian syndrome)    Past Surgical History  Procedure Laterality Date  . Tonsillectomy    . Knee surgery    . Wisdom tooth extraction     Social History  Substance Use Topics  . Smoking status: Current Every Day Smoker -- 1.00 packs/day for 15 years    Types: Cigarettes  . Smokeless tobacco: Never Used     Comment: using vapes  . Alcohol Use: Yes   family history includes GER disease in her father; Heart attack in her other; Hypertension in her mother; Migraines in her father; Thyroid disease in her brother and mother.  ROS as above Medications: Current Outpatient Prescriptions  Medication Sig Dispense Refill  . methocarbamol (ROBAXIN) 500 MG tablet Take one or two tabs QID as needed for muscle spasm 30 tablet 0  . HYDROcodone-acetaminophen (NORCO/VICODIN) 5-325 MG per tablet Take one by mouth at  bedtime as needed for pain 15 tablet 0  . methylPREDNISolone (MEDROL DOSEPAK) 4 MG TBPK tablet 21 tablet medrol dosepack (Patient not taking: Reported on 03/10/2015) 21 tablet 0  . phentermine 37.5 MG capsule One capsule by mouth qAM 30 capsule 0   No current facility-administered medications for this visit.   Allergies  Allergen Reactions  . Celexa [Citalopram Hydrobromide]   . Ibuprofen     High doses  . Prednisone   . Prilosec [Omeprazole]   . Wellbutrin [Bupropion]   . Flexeril [Cyclobenzaprine] Other (See Comments)    Dreams     Exam:  BP 134/83 mmHg  Pulse 60  Ht 5\' 9"  (1.753 m)  Wt 300 lb (136.079 kg)  BMI 44.28 kg/m2  LMP 02/15/2015 Gen: Well NAD HEENT: EOMI,  MMM Lungs: Normal work of breathing. CTABL Heart: RRR no MRG Abd: NABS, Soft. Nondistended, Nontender Exts: Brisk capillary refill, warm and well perfused.  Neck: Nontender to midline. Tender palpation right cervical paraspinal and trapezius. Pain with neck range of motion to the right lateral flexion and right rotation. However the range of motion is normal. Negative Spurling's test. Upper shoulder strength is equal and normal bilaterally. Sensation is intact throughout.  No results found for this or any previous visit (from the past 24 hour(s)). No results found.   Please see individual assessment and plan sections.

## 2015-03-14 ENCOUNTER — Ambulatory Visit: Payer: BLUE CROSS/BLUE SHIELD | Admitting: Rehabilitative and Restorative Service Providers"

## 2015-03-16 ENCOUNTER — Ambulatory Visit (INDEPENDENT_AMBULATORY_CARE_PROVIDER_SITE_OTHER): Payer: BLUE CROSS/BLUE SHIELD | Admitting: Physical Therapy

## 2015-03-16 ENCOUNTER — Encounter: Payer: Self-pay | Admitting: Physical Therapy

## 2015-03-16 DIAGNOSIS — R29898 Other symptoms and signs involving the musculoskeletal system: Secondary | ICD-10-CM

## 2015-03-16 DIAGNOSIS — M436 Torticollis: Secondary | ICD-10-CM

## 2015-03-16 DIAGNOSIS — R293 Abnormal posture: Secondary | ICD-10-CM

## 2015-03-16 DIAGNOSIS — M542 Cervicalgia: Secondary | ICD-10-CM | POA: Diagnosis not present

## 2015-03-16 DIAGNOSIS — M25512 Pain in left shoulder: Secondary | ICD-10-CM

## 2015-03-16 NOTE — Therapy (Signed)
[facility] [place], [place], [address] Phone: [phone]   Fax:  [phone]  Physical Therapy Evaluation  Patient Details  Name: Ariana Parks MRN: [id number] Date of Birth: 29-Mar-1981 Referring Provider:  Gregor Hams, MD  Encounter Date: 03/16/2015      PT End of Session - 03/16/15 1653    Visit Number 1   Number of Visits 8   Date for PT Re-Evaluation 04/13/15  PT trail to see if it works   PT Start Time 1616   PT Stop Time 1700   PT Time Calculation (min) 44 min   Activity Tolerance Patient limited by pain   Behavior During Therapy Agitated;Anxious      Past Medical History  Diagnosis Date  . GERD (gastroesophageal reflux disease)   . Panic attack   . High cholesterol   . Depression   . PCOS (polycystic ovarian syndrome)     Past Surgical History  Procedure Laterality Date  . Tonsillectomy    . Knee surgery    . Wisdom tooth extraction      There were no vitals filed for this visit.  Visit Diagnosis:  Pain, neck - Plan: PT plan of care cert/re-cert  Stiffness of neck - Plan: PT plan of care cert/re-cert  Pain in shoulder region, left - Plan: PT plan of care cert/re-cert  Weakness of shoulder - Plan: PT plan of care cert/re-cert  Abnormal posture - Plan: PT plan of care cert/re-cert      Subjective Assessment - 03/16/15 1602    Subjective Pt had a coughing fit the end of last month and developed neck pain after this.  Pt also states she began having neck pain about 1-2 months ago, with tossing packages at work.  Works for [employer]   Pertinent History she has been taking pain meds and muscle relaxer - taking every day. Currenlty written out of work until cleared by MD. H/o LBP for 3-4 yrs ago.     How long can you stand comfortably? not limited due to neck   How long can you walk comfortably? not limited due to neck .    Diagnostic tests MRI (-)    Patient Stated Goals reduce  pain, return to work,    Currently in Pain? Yes   Pain Score 5    Pain Location Neck   Pain Orientation Lower;Mid   Pain Descriptors / Indicators Tightness;Aching   Pain Type Acute pain   Pain Onset More than a month ago   Pain Frequency Constant   Aggravating Factors  turning and looking certain ways, lying with the head turned. No relief from ice.    Pain Relieving Factors medication,             [facility] PT Assessment - 03/16/15 0001    Assessment   Medical Diagnosis cervical strain    Onset Date/Surgical Date 01/13/15   Hand Dominance Left   Next MD Visit 04/09/15   Prior Therapy none   Precautions   Precautions --  no work right now   Balance Screen   Has the patient fallen in the past 6 months Yes   How many times? 1  at work about 2-3 wks before the pain started   Has the patient had a decrease in activity level because of a fear of falling?  No   Is the patient reluctant to leave their home because of a fear of falling?  No  Home Environment   Living Environment Private residence   Living Arrangements --  no difficulties with household activities   Prior Function   Level of Independence Independent   Vocation Part time employment   Vocation Requirements [employer], has to lift 30#,    Leisure takes care of brother   Observation/Other Assessments   Focus on Therapeutic Outcomes (FOTO)  44% limited   Posture/Postural Control   Posture/Postural Control Postural limitations   Postural Limitations Rounded Shoulders;Forward head;Decreased thoracic kyphosis  extra abdominal girth   ROM / Strength   AROM / PROM / Strength AROM;Strength   AROM   AROM Assessment Site Shoulder;Cervical   Right/Left Shoulder Right;Left   Right Shoulder Flexion 130 Degrees  neck pain    Right Shoulder ABduction 90 Degrees  neck pain    Left Shoulder Flexion 130 Degrees  neck pain    Left Shoulder ABduction 90 Degrees  neck pain   Left Shoulder Internal Rotation --  WNL however  unable to reach behind    Cervical Flexion WNL  pulling back of neck   Cervical Extension WNL   Cervical - Right Side Bend 10   Cervical - Left Side Bend 16   Cervical - Right Rotation 45   Cervical - Left Rotation 35   Strength   Strength Assessment Site Shoulder   Right/Left Shoulder Left  Rt WNL   Left Shoulder Flexion 5/5   Left Shoulder Extension 5/5   Left Shoulder ABduction 4+/5   Left Shoulder Internal Rotation 5/5   Left Shoulder External Rotation 4/5   Palpation   Palpation comment pt is hypersensitive to all touch on her Lt shoulder complex, cervical and thoracic area.  Unable to truly assess as pt is hyper responsive to all touch at this time.    Special Tests    Special Tests --  not able to assess                   [facility] Adult PT Treatment/Exercise - 03/16/15 0001    Self-Care   Self-Care Other Self-Care Comments  reviewed posture and affects on neck strain,   Other Self-Care Comments  importance of gentle mov't to break the pain cycle and decrease stiffness.    Modalities   Modalities Electrical Stimulation;Moist Heat   Moist Heat Therapy   Number Minutes Moist Heat 15 Minutes   Moist Heat Location Cervical  and thoracic   Electrical Stimulation   Electrical Stimulation Location neck/thoracic   Electrical Stimulation Action IFC   Electrical Stimulation Parameters to tolerance   Electrical Stimulation Goals Pain   Manual Therapy   Manual therapy comments not able to tolerate any at this time.                 PT Education - 03/16/15 1647    Education provided Yes   Education Details HEP, effects of posture on cervical strain   Person(s) Educated Patient   Methods Explanation;Demonstration;Handout   Comprehension Need further instruction;Returned demonstration;Verbalized understanding             PT Long Term Goals - 03/16/15 1659    PT LONG TERM GOAL #1   Title I with HEP ( 04/13/15)    Time 4   Period Weeks   Status New    PT LONG TERM GOAL #2   Title increase cervical ROM =/> 55 degrees bilat ( 04/13/15)    Time 4   Period Weeks   Status New  PT LONG TERM GOAL #3   Title report pain decrease =/> 50% ( 04/13/15)    Time 4   Period Weeks   Status New   PT LONG TERM GOAL #4   Title improve FOTO =/< 30% limited ( 04/13/15)    Time 4   Period Weeks   Status New   PT LONG TERM GOAL #5   Title progress to lifitng if pt can tolerate it ( 04/13/15)    Time 4   Period Weeks   Status New               Plan - 03/16/15 1654    Clinical Impression Statement 34 yo female presents with neck pain, hypersensitive/reactive to all touch of her neck/thoracic/Lt shoulder, some limitation with cervical ROM, Significant postural changes. MRI was negative as were x-rays. Pt reports difficulty with llying supine, even with multiple pillow to prop/support, likes prone with her head turned to the side.  Explained this places extra stress on the neck.    Pt will benefit from skilled therapeutic intervention in order to improve on the following deficits Postural dysfunction;Decreased strength;Pain;Decreased activity tolerance;Increased muscle spasms;Impaired UE functional use;Decreased range of motion   Rehab Potential Fair   PT Frequency 2x / week   PT Duration 4 weeks  trial of PT to see if she will become more able to tolerate and more receptive to movement.    PT Treatment/Interventions Traction;Ultrasound;Patient/family education;Passive range of motion;Cryotherapy;Electrical Stimulation;Moist Heat;Therapeutic exercise;Manual techniques   PT Next Visit Plan postural realignment, modalities for pain, manual work if she can tolerate it.    Consulted and Agree with Plan of Care Patient         Problem List Patient Active Problem List   Diagnosis Date Noted  . Abnormal weight gain 03/10/2015  . Cervical strain 02/25/2015  . CELLULITIS/ABSCESS, MOUTH 04/05/2010  . OPEN WOUND OF MOUTH UNSPECIFIED SITE  COMPLICATED 77/41/4239    Jeral Pinch PT 03/16/2015, 5:04 PM  [facility] [place], [place], [address] Phone: [phone]   Fax:  [phone]

## 2015-03-16 NOTE — Patient Instructions (Signed)
Decompression Exercise: Arm Support   [place] [phone]   Lie on back on firm surface, knees bent, feet flat, arms turned up, out to sides, backs of hands down. Support under arms: towel. Support under head with pillows. Time _5__ minutes. Surface: floor once a dday  Shoulder Press   Use pillows under head and arms as needed. Press both shoulders down. Hold _3-5__ seconds. Repeat _10__ times. Press one shoulder down. Hold __3-5_ seconds Repeat _10__ times. Do other shoulder. If unable to press one or both shoulders, lie in position a few sessions until you can. Once a day Surface: floor   Head Press With [facility]   Use pillows under head to provide support and comfort, tuck chin SLIGHTLY toward chest, keep mouth closed. Feel weight on back of head. Increase weight by pressing head down. Hold _3-5__ seconds. Relax. Repeat _10__ times. Once  A day Surface: floor   Shoulder Circle Shrug   Bring shoulders up and rotate around backward. Repeat _10___ times, repeat _2-3__ sessions per day.  Scapular Retraction (Standing)   With arms at sides, pinch shoulder blades together. Repeat _10___ times per set. Do __1_ sets per session. Do __2-3__ sessions per day.

## 2015-03-23 ENCOUNTER — Encounter: Payer: BLUE CROSS/BLUE SHIELD | Admitting: Physical Therapy

## 2015-03-25 ENCOUNTER — Encounter: Payer: BLUE CROSS/BLUE SHIELD | Admitting: Physical Therapy

## 2015-04-06 ENCOUNTER — Ambulatory Visit (INDEPENDENT_AMBULATORY_CARE_PROVIDER_SITE_OTHER): Payer: BLUE CROSS/BLUE SHIELD | Admitting: Rehabilitative and Restorative Service Providers"

## 2015-04-06 ENCOUNTER — Encounter: Payer: Self-pay | Admitting: Rehabilitative and Restorative Service Providers"

## 2015-04-06 DIAGNOSIS — R293 Abnormal posture: Secondary | ICD-10-CM

## 2015-04-06 DIAGNOSIS — M25512 Pain in left shoulder: Secondary | ICD-10-CM

## 2015-04-06 DIAGNOSIS — M436 Torticollis: Secondary | ICD-10-CM | POA: Diagnosis not present

## 2015-04-06 DIAGNOSIS — M542 Cervicalgia: Secondary | ICD-10-CM

## 2015-04-06 DIAGNOSIS — R29898 Other symptoms and signs involving the musculoskeletal system: Secondary | ICD-10-CM | POA: Diagnosis not present

## 2015-04-06 NOTE — Therapy (Signed)
[place] [address], [place], [address] Phone: [phone]   Fax:  [phone]  Physical Therapy Treatment  Patient Details  Name: Ariana Parks MRN: [id number] Date of Birth: [birth year] Referring Provider:  Gregor Hams, MD  Encounter Date: 04/06/2015      PT End of Session - 04/06/15 1532    Visit Number 2   Number of Visits 8   Date for PT Re-Evaluation 04/13/15   PT Start Time 1440   PT Stop Time 1537   PT Time Calculation (min) 57 min   Activity Tolerance Patient limited by pain;Patient tolerated treatment well      Past Medical History  Diagnosis Date  . GERD (gastroesophageal reflux disease)   . Panic attack   . High cholesterol   . Depression   . PCOS (polycystic ovarian syndrome)     Past Surgical History  Procedure Laterality Date  . Tonsillectomy    . Knee surgery    . Wisdom tooth extraction      There were no vitals filed for this visit.  Visit Diagnosis:  Pain, neck  Stiffness of neck  Pain in shoulder region, left  Weakness of shoulder  Abnormal posture      Subjective Assessment - 04/06/15 1443    Subjective Neck pain continues and shoulders alternate in hurting, one then the other. Hard for her to stand or sit up straight.    Currently in Pain? Yes   Pain Score 4    Pain Location Neck   Pain Orientation Mid;Lower;Left   Pain Descriptors / Indicators Tightness;Aching   Pain Type Acute pain   Pain Onset More than a month ago   Pain Frequency Constant   Aggravating Factors  turing to look in different directions   Pain Relieving Factors medication            [facility] PT Assessment - 04/06/15 0001    AROM   Cervical Flexion 50  pulling   Cervical Extension 55   Cervical - Right Side Bend 37  pain   Cervical - Left Side Bend 40  tightness   Cervical - Right Rotation 62  pulling   Cervical - Left Rotation 58  pain                      [facility] Adult PT Treatment/Exercise - 04/06/15 0001    Neuro Re-ed    Neuro Re-ed Details  working on [employer] and alignment lifting chest and pulling shoudler blades down and back    Neck Exercises: Standing   Neck Retraction 10 reps;10 secs   Neck Exercises: Supine   Neck Retraction 10 reps;10 secs   Modalities   Modalities Electrical Stimulation;Moist Heat   Cryotherapy   Number Minutes Cryotherapy 15 Minutes   Cryotherapy Location Cervical;Shoulder   Type of Cryotherapy Ice pack   Electrical Stimulation   Electrical Stimulation Location neck/thoracic   Electrical Stimulation Action IFC   Electrical Stimulation Parameters to tolerance   Manual Therapy   Myofascial Release chest/sternum; bilat pecs   Passive ROM cervical    Neck Exercises: Land Limitations doorway stretch 3 positions 30 sec hold 2 reps                 PT Education - 04/06/15 1513    Education provided Yes   Education Details posture; alignment; HEP    Person(s) Educated Patient   Methods Explanation;Demonstration;Tactile cues;Verbal cues;Handout  Comprehension Verbalized understanding;Returned demonstration;Verbal cues required;Tactile cues required             PT Long Term Goals - 04/06/15 1536    PT LONG TERM GOAL #1   Title I with HEP ( 04/13/15)    Time 4   Period Weeks   Status On-going   PT LONG TERM GOAL #2   Title increase cervical ROM =/> 55 degrees bilat ( 04/13/15)    Time 4   Period Weeks   Status On-going   PT LONG TERM GOAL #3   Title report pain decrease =/> 50% ( 04/13/15)    Time 4   Period Weeks   Status On-going   PT LONG TERM GOAL #4   Title improve FOTO =/< 30% limited ( 04/13/15)    Time 4   Period Weeks   Status On-going   PT LONG TERM GOAL #5   Title progress to lifitng if pt can tolerate it ( 04/13/15)    Time 4   Period Weeks   Status On-going               Plan - 04/06/15 1533    Clinical Impression Statement Patient  reports that she has been unable to come for treatment in the past two weeks due to stomach problems. She continues to have pain through the neck and shoulders which has not changed significantly. She has difficulty with  ADL's and remains out of work.    Pt will benefit from skilled therapeutic intervention in order to improve on the following deficits Postural dysfunction;Decreased strength;Pain;Decreased activity tolerance;Increased muscle spasms;Impaired UE functional use;Decreased range of motion   Rehab Potential Fair   PT Frequency 2x / week   PT Duration 4 weeks   PT Treatment/Interventions Traction;Ultrasound;Patient/family education;Passive range of motion;Cryotherapy;Electrical Stimulation;Moist Heat;Therapeutic exercise;Manual techniques   PT Next Visit Plan postural realignment, modalities for pain, manual work as tolerated; trial of [place] for neck and shouder area - pt says she has responded well to [place] in the past    PT Home Exercise Plan postural correction; HEP   Consulted and Agree with Plan of Care Patient        Problem List Patient Active Problem List   Diagnosis Date Noted  . Abnormal weight gain 03/10/2015  . Cervical strain 02/25/2015  . CELLULITIS/ABSCESS, MOUTH 04/05/2010  . OPEN WOUND OF MOUTH UNSPECIFIED SITE COMPLICATED 43/88/8757    Jassiel Flye Nilda Simmer PT, MPH 04/06/2015, 3:49 PM  [facility] [place], [place], [address] Phone: [phone]   Fax:  [phone]

## 2015-04-06 NOTE — Patient Instructions (Signed)
Axial Extension (Chin Tuck)    Pull chin in and lengthen back of neck. Hold _10-15___ seconds while counting out loud. Repeat _10___ times. Do _several___ sessions per day. Also do this lying on back with head supported on thin pillow  Shoulder Blade Squeeze    Rotate shoulders back, then squeeze shoulder blades down and back. Hold 10 sec Repeat __10__ times. Do _several___ sessions per day. Can work with swim noodle or towel placed behind your back along yout spine  Scapula Adduction With Pectoralis Stretch: Low - Standing   Shoulders at 45 hands even with shoulders, keeping weight through legs, shift weight forward until you feel pull or stretch through the front of your chest. Hold _30__ seconds. Do _3__ times, _2-4__ times per day.   Scapula Adduction With Pectoralis Stretch: Mid-Range - Standing   Shoulders at 90 elbows even with shoulders, keeping weight through legs, shift weight forward until you feel pull or strength through the front of your chest. Hold __30_ seconds. Do _3__ times, __2-4_ times per day.   Scapula Adduction With Pectoralis Stretch: High - Standing   Shoulders at 120 hands up high on the doorway, keeping weight on feet, shift weight forward until you feel pull or stretch through the front of your chest. Hold _30__ seconds. Do _3__ times, _2-3__ times per day.  Lying on back hips and knees bent; stretch arms out to side resting arms on bed with shoulders colse to 90 degrees from body.  Hold for 3-5 min can bend elbows to release the stretch if needed.

## 2015-04-08 ENCOUNTER — Encounter: Payer: Self-pay | Admitting: Family Medicine

## 2015-04-08 ENCOUNTER — Ambulatory Visit (INDEPENDENT_AMBULATORY_CARE_PROVIDER_SITE_OTHER): Payer: BLUE CROSS/BLUE SHIELD | Admitting: Family Medicine

## 2015-04-08 VITALS — BP 142/85 | HR 73 | Wt 293.0 lb

## 2015-04-08 DIAGNOSIS — S161XXA Strain of muscle, fascia and tendon at neck level, initial encounter: Secondary | ICD-10-CM | POA: Diagnosis not present

## 2015-04-08 DIAGNOSIS — R635 Abnormal weight gain: Secondary | ICD-10-CM

## 2015-04-08 MED ORDER — PHENTERMINE HCL 37.5 MG PO CAPS: ORAL_CAPSULE | ORAL | Status: DC

## 2015-04-08 MED ORDER — METHOCARBAMOL 500 MG PO TABS: ORAL_TABLET | ORAL | Status: DC

## 2015-04-08 NOTE — Patient Instructions (Signed)
Thank you for coming in today. Continue physical therapy.  Return in 1 month.  Come back or go to the emergency room if you notice new weakness new numbness problems walking or bowel or bladder problems.

## 2015-04-08 NOTE — Assessment & Plan Note (Signed)
Doing well. Continue PT. Recheck 1 month.

## 2015-04-08 NOTE — Progress Notes (Signed)
Ariana Parks is a 34 y.o. female who presents to [facility]: [facility]  today for neck pain follow-up and weight follow-up.  1) patient has ongoing neck pain for the last several months to weeks. She is currently out of work as a Designer, jewellery. She's been to 2 physical therapy sessions which have helped. Her symptoms are moderate and improving. No fevers chills nausea vomiting or diarrhea. She denies any significant radicular pain. Pain is located predominantly in the trapezius is.  2) weight gain: Doing well with phentermine. Losing weight. She had some mild stomach upset over the last month but currently feels well.   Past Medical History  Diagnosis Date  . GERD (gastroesophageal reflux disease)   . Panic attack   . High cholesterol   . Depression   . PCOS (polycystic ovarian syndrome)    Past Surgical History  Procedure Laterality Date  . Tonsillectomy    . Knee surgery    . Wisdom tooth extraction     Social History  Substance Use Topics  . Smoking status: Current Every Day Smoker -- 1.00 packs/day for 15 years    Types: Cigarettes  . Smokeless tobacco: Never Used     Comment: using vapes  . Alcohol Use: Yes   family history includes GER disease in her father; Heart attack in her other; Hypertension in her mother; Migraines in her father; Thyroid disease in her brother and mother.  ROS as above Medications: Current Outpatient Prescriptions  Medication Sig Dispense Refill  . HYDROcodone-acetaminophen (NORCO/VICODIN) 5-325 MG per tablet Take one by mouth at bedtime as needed for pain 15 tablet 0  . methocarbamol (ROBAXIN) 500 MG tablet Take one or two tabs QID as needed for muscle spasm 30 tablet 0  . phentermine 37.5 MG capsule One capsule by mouth qAM 30 capsule 0   No current facility-administered medications for this visit.   Allergies  Allergen Reactions  . Celexa [Citalopram Hydrobromide]   . Ibuprofen     High doses  . Prednisone    . Prilosec [Omeprazole]   . Wellbutrin [Bupropion]   . Flexeril [Cyclobenzaprine] Other (See Comments)    Dreams     Exam:  BP 142/85 mmHg  Pulse 73  Wt 293 lb (132.904 kg) Gen: Well NAD HEENT: EOMI,  MMM Lungs: Normal work of breathing. CTABL Heart: RRR no MRG Abd: NABS, Soft. Nondistended, Nontender Exts: Brisk capillary refill, warm and well perfused.   neck: Nontender to midline. Normal neck range of motion. Tender palpation bilateral trapeziuses. Normal motion. Strength is intact distally.  No results found for this or any previous visit (from the past 24 hour(s)). No results found.   Please see individual assessment and plan sections.

## 2015-04-08 NOTE — Assessment & Plan Note (Signed)
Weight loss. Continue phentermine.

## 2015-04-13 ENCOUNTER — Encounter: Payer: BLUE CROSS/BLUE SHIELD | Admitting: Rehabilitative and Restorative Service Providers"

## 2015-04-15 ENCOUNTER — Ambulatory Visit (INDEPENDENT_AMBULATORY_CARE_PROVIDER_SITE_OTHER): Payer: BLUE CROSS/BLUE SHIELD | Admitting: Rehabilitative and Restorative Service Providers"

## 2015-04-15 DIAGNOSIS — M542 Cervicalgia: Secondary | ICD-10-CM

## 2015-04-15 DIAGNOSIS — R29898 Other symptoms and signs involving the musculoskeletal system: Secondary | ICD-10-CM

## 2015-04-15 DIAGNOSIS — M436 Torticollis: Secondary | ICD-10-CM

## 2015-04-15 DIAGNOSIS — R293 Abnormal posture: Secondary | ICD-10-CM

## 2015-04-15 DIAGNOSIS — M25512 Pain in left shoulder: Secondary | ICD-10-CM | POA: Diagnosis not present

## 2015-04-15 NOTE — Therapy (Addendum)
[place]  [place], [place], [address] Phone: [phone]   Fax:  [phone]  Physical Therapy Treatment  Patient Details  Name: Ariana Parks MRN: [id number] Date of Birth: 15-Aug-1980 No Data Recorded  Encounter Date: 04/15/2015      PT End of Session - 04/15/15 1431    Visit Number 3   Number of Visits 8   Date for PT Re-Evaluation 04/13/15   PT Start Time 1406   PT Stop Time 1422   PT Time Calculation (min) 16 min   Activity Tolerance Patient tolerated treatment well      Past Medical History  Diagnosis Date  . GERD (gastroesophageal reflux disease)   . Panic attack   . High cholesterol   . Depression   . PCOS (polycystic ovarian syndrome)     Past Surgical History  Procedure Laterality Date  . Tonsillectomy    . Knee surgery    . Wisdom tooth extraction      There were no vitals filed for this visit.  Visit Diagnosis:  Pain, neck  Stiffness of neck  Pain in shoulder region, left  Weakness of shoulder  Abnormal posture      Subjective Assessment - 04/15/15 1423    Subjective Patient presents 5 min late for appt stating that she had to leave in less than 20 min to pick up her niece but she wanted to stay for treatment. Pt c/o pain in the mid to lower back and through her chest today. She has been doing exercises at home and feels that things are some better.    Currently in Pain? Yes   Pain Score 4    Pain Location Back   Pain Orientation Mid   Pain Descriptors / Indicators Tightness;Aching                         [facility] Adult PT Treatment/Exercise - 04/15/15 0001    Neck Exercises: Standing   Neck Retraction 10 reps;10 secs   Lumbar Exercises: Stretches   Lower Trunk Rotation 5 reps  5 sec hold   Lumbar Exercises: Quadruped   Madcat/Old Horse 10 reps   Other Quadruped Lumbar Exercises cat to sit back for stretch 5 reps 20 ssec hold    Neck  Exercises: Stretches   Corner Stretch Limitations doorway stretch 3 positions 30 sec hold 2 reps                 PT Education - 04/15/15 1415    Education provided Yes   Education Details posture and alingment; HEP; added stretches for mid/low back   Person(s) Educated Patient   Methods Explanation;Demonstration;Tactile cues;Verbal cues;Handout   Comprehension Verbalized understanding;Returned demonstration;Verbal cues required;Tactile cues required             PT Long Term Goals - 04/06/15 1536    PT LONG TERM GOAL #1   Title I with HEP ( 04/13/15)    Time 4   Period Weeks   Status On-going   PT LONG TERM GOAL #2   Title increase cervical ROM =/> 55 degrees bilat ( 04/13/15)    Time 4   Period Weeks   Status On-going   PT LONG TERM GOAL #3   Title report pain decrease =/> 50% ( 04/13/15)    Time 4   Period Weeks   Status On-going   PT LONG TERM GOAL #4   Title improve FOTO =/<  30% limited ( 04/13/15)    Time 4   Period Weeks   Status On-going   PT LONG TERM GOAL #5   Title progress to lifitng if pt can tolerate it ( 04/13/15)    Time 4   Period Weeks   Status On-going               Plan - 04/15/15 1432    Clinical Impression Statement Patient reports that her neck doesn't feel too bac today; she still has tightness through the chest area and today has pain and tightness through mid and low back. Has limited time for therapy but really needs to be here. Will be out of town on vacation next week but plans to return the following week.    Pt will benefit from skilled therapeutic intervention in order to improve on the following deficits Postural dysfunction;Decreased strength;Pain;Decreased activity tolerance;Increased muscle spasms;Impaired UE functional use;Decreased range of motion   Rehab Potential Fair   PT Frequency 2x / week   PT Duration 4 weeks   PT Treatment/Interventions Traction;Ultrasound;Patient/family education;Passive range of  motion;Cryotherapy;Electrical Stimulation;Moist Heat;Therapeutic exercise;Manual techniques   PT Next Visit Plan postural realignment, modalities for pain, manual work as tolerated; trial of [place] for neck and shouder area - pt says she has responded well to [place] in the past    PT Home Exercise Plan postural correction; HEP   Consulted and Agree with Plan of Care Patient        Problem List Patient Active Problem List   Diagnosis Date Noted  . Abnormal weight gain 03/10/2015  . Cervical strain 02/25/2015    Jazmin Ley Nilda Simmer PT, MPH 04/15/2015, 2:36 PM  [facility] [place], [place], [address] Phone: [phone]   Fax:  [phone]  Name: Ariana Parks MRN: [id number] Date of Birth: [birth year]    PHYSICAL THERAPY DISCHARGE SUMMARY  Visits from Start of Care: 3  Current functional level related to goals / functional outcomes: improved functional activity level.    Remaining deficits: Continued pain and postural deficits    Education / Equipment: HEP  Plan: Patient agrees to discharge.  Patient goals were partially met. Patient is being discharged due to not returning since the last visit.  ?????    Kymberlyn Eckford P. Helene Kelp PT, MPH 05/03/2015 8:19 AM

## 2015-04-15 NOTE — Patient Instructions (Signed)
Angry Cat Stretch    Tuck chin and tighten stomach, arching back. Hold 3-5 sec  Repeat __10__ times per set. Do _3-4___ sessions per day.   Arch back like above - sit back onto heels  Hold for 10-20 sec  3-5 reps 3-4 times/day   Knee Roll    Lying on back, with knees bent and feet flat on floor, arms outstretched to sides, slowly roll both knees to side, hold 5 seconds. Back to starting position, hold 5 seconds. Then to opposite side, hold 5 seconds. Return to starting position. Keep shoulders and arms in contact with floor.

## 2015-04-25 ENCOUNTER — Encounter: Payer: BLUE CROSS/BLUE SHIELD | Admitting: Rehabilitative and Restorative Service Providers"

## 2015-05-06 ENCOUNTER — Encounter: Payer: BLUE CROSS/BLUE SHIELD | Admitting: Physical Therapy

## 2015-05-09 ENCOUNTER — Encounter: Payer: BLUE CROSS/BLUE SHIELD | Admitting: Physical Therapy

## 2015-05-12 ENCOUNTER — Encounter: Payer: BLUE CROSS/BLUE SHIELD | Admitting: Physical Therapy

## 2015-05-30 ENCOUNTER — Telehealth: Payer: Self-pay

## 2015-05-30 ENCOUNTER — Ambulatory Visit (INDEPENDENT_AMBULATORY_CARE_PROVIDER_SITE_OTHER): Payer: BLUE CROSS/BLUE SHIELD | Admitting: Family Medicine

## 2015-05-30 DIAGNOSIS — R635 Abnormal weight gain: Secondary | ICD-10-CM

## 2015-05-30 NOTE — Telephone Encounter (Signed)
Patient wants a note for work stating she cannot lift more than 15 pounds.

## 2015-05-30 NOTE — Progress Notes (Signed)
No show Dr Georgina Snell 05/30/2015

## 2015-05-30 NOTE — Telephone Encounter (Signed)
Pt came into clinic and received requested letter. Pt must keep follow up appointment.

## 2015-05-31 ENCOUNTER — Ambulatory Visit (INDEPENDENT_AMBULATORY_CARE_PROVIDER_SITE_OTHER): Payer: BLUE CROSS/BLUE SHIELD | Admitting: Family Medicine

## 2015-05-31 ENCOUNTER — Encounter: Payer: Self-pay | Admitting: Family Medicine

## 2015-05-31 VITALS — BP 144/91 | HR 64 | Wt 295.0 lb

## 2015-05-31 DIAGNOSIS — S161XXA Strain of muscle, fascia and tendon at neck level, initial encounter: Secondary | ICD-10-CM | POA: Diagnosis not present

## 2015-05-31 DIAGNOSIS — E282 Polycystic ovarian syndrome: Secondary | ICD-10-CM

## 2015-05-31 DIAGNOSIS — N939 Abnormal uterine and vaginal bleeding, unspecified: Secondary | ICD-10-CM | POA: Diagnosis not present

## 2015-05-31 MED ORDER — MEDROXYPROGESTERONE ACETATE 10 MG PO TABS: ORAL_TABLET | ORAL | Status: DC

## 2015-05-31 NOTE — Assessment & Plan Note (Signed)
Probably related to PCOS with [place] with possible uterine fibroid. Pelvic ultrasound ordered as well as course of Provera.  Additionally referral to OB/GYN.

## 2015-05-31 NOTE — Patient Instructions (Signed)
Thank you for coming in today. Call or go to the emergency room if you get worse, have trouble breathing, have chest pains, or palpitations.  You should hear from ultrasound and OBGYN.  Start taking provera pills 2 weeks after you stop bleeding or when you start bleeding again.  Re-attend physical therapy.  Return in 1 month.  Stop phentermine.

## 2015-05-31 NOTE — Assessment & Plan Note (Signed)
Nuclear component is improved. She has not maximized physical therapy. Re-authorized work restriction. Repeat referral to physical therapy. Return in one month

## 2015-05-31 NOTE — Progress Notes (Signed)
Ariana Parks is a 34 y.o. female who presents to [facility]: [facility] today for follow-up neck pain and vaginal bleeding.Marland Kitchen  1) neck pain. Patient continues to have mild bilateral neck pain. Pain is manageable at rest however is worse with lifting. She still requires a less than 15 pound lifting restriction at work. She denies any radiating pain weakness or numbness. Has been quite some time since her last physical therapy appointment.  2) vaginal bleeding: Patient has ongoing vaginal bleeding. She was diagnosed with PCO S is a young woman and had intermittent irregular bleeding. Over the last 3 or 4 months she's had heavy vaginal bleeding that is almost constant. She denies any lightheadedness or dizziness. She does not take any medication for her symptoms.   Past Medical History  Diagnosis Date  . GERD (gastroesophageal reflux disease)   . Panic attack   . High cholesterol   . Depression   . PCOS (polycystic ovarian syndrome)    Past Surgical History  Procedure Laterality Date  . Tonsillectomy    . Knee surgery    . Wisdom tooth extraction     Social History  Substance Use Topics  . Smoking status: Current Every Day Smoker -- 1.00 packs/day for 15 years    Types: Cigarettes  . Smokeless tobacco: Never Used     Comment: using vapes  . Alcohol Use: Yes   family history includes GER disease in her father; Heart attack in her other; Hypertension in her mother; Migraines in her father; Thyroid disease in her brother and mother.  ROS as above Medications: Current Outpatient Prescriptions  Medication Sig Dispense Refill  . medroxyPROGESTERone (PROVERA) 10 MG tablet Use 1 pill for 10 days on day 16 of cycle 30 tablet 2   No current facility-administered medications for this visit.   Allergies  Allergen Reactions  . Celexa [Citalopram Hydrobromide]   . Ibuprofen     High doses  . Prednisone   . Prilosec [Omeprazole]   . Wellbutrin [Bupropion]   .  Flexeril [Cyclobenzaprine] Other (See Comments)    Dreams     Exam:  BP 144/91 mmHg  Pulse 64  Wt 295 lb (133.811 kg) Gen: Well NAD obese HEENT: EOMI,  MMM Lungs: Normal work of breathing. CTABL Heart: RRR no MRG Abd: NABS, Soft. Nondistended, Nontender Exts: Brisk capillary refill, warm and well perfused.  MSK: Neck is nontender to midline. Normal neck range of motion. Upper extremity strength is intact throughout. Pulses capillary refill sensation intact.  No results found for this or any previous visit (from the past 24 hour(s)). No results found.   Please see individual assessment and plan sections.

## 2015-06-07 ENCOUNTER — Other Ambulatory Visit: Payer: Self-pay | Admitting: Family Medicine

## 2015-06-07 DIAGNOSIS — E282 Polycystic ovarian syndrome: Secondary | ICD-10-CM

## 2015-06-07 DIAGNOSIS — N939 Abnormal uterine and vaginal bleeding, unspecified: Secondary | ICD-10-CM

## 2015-06-09 ENCOUNTER — Ambulatory Visit (INDEPENDENT_AMBULATORY_CARE_PROVIDER_SITE_OTHER): Payer: BLUE CROSS/BLUE SHIELD | Admitting: Physical Therapy

## 2015-06-09 DIAGNOSIS — M436 Torticollis: Secondary | ICD-10-CM | POA: Diagnosis not present

## 2015-06-09 DIAGNOSIS — M25512 Pain in left shoulder: Secondary | ICD-10-CM

## 2015-06-09 DIAGNOSIS — R29898 Other symptoms and signs involving the musculoskeletal system: Secondary | ICD-10-CM | POA: Diagnosis not present

## 2015-06-09 DIAGNOSIS — R293 Abnormal posture: Secondary | ICD-10-CM

## 2015-06-09 DIAGNOSIS — M542 Cervicalgia: Secondary | ICD-10-CM | POA: Diagnosis not present

## 2015-06-09 NOTE — Patient Instructions (Signed)
Scapular Retraction (Standing)    With arms at sides, pinch shoulder blades together. Repeat __10__ times per set. Do __1__ sets per session. Do _3-4___ sessions per day.  Side Pull: Double Arm   On back, knees bent, feet flat. Arms perpendicular to body, shoulder level, elbows straight but relaxed. Pull arms out to sides, elbows straight, squeeze shoulder blades together. Resistance band comes across collarbones, hands toward floor. Hold momentarily. Slowly return to starting position. Repeat _2x10__ times. Band color __red___   Shoulder Rotation: Double Arm   On back, knees bent, feet flat, elbows tucked at sides, bent 90, hands palms up. Pull hands apart and down toward floor, keeping elbows near sides. Hold momentarily. Slowly return to starting position. Repeat _2x10__ times. Band color _red_____   Scapula Adduction With Pectorals, Low   Stand in doorframe with palms against frame and arms at 45. Lean forward and squeeze shoulder blades. Hold _30-45__ seconds. Repeat _1__ times per session. Do _1__ sessions per day.   Scapula Adduction With Pectorals, Mid-Range   Stand in doorframe with palms against frame and arms at 90. Lean forward and squeeze shoulder blades. Hold __30-45_ seconds. Repeat _1__ times per session. Do _1__ sessions per day.   Flexibility: Upper Trapezius Stretch    Gently grasp right side of head while reaching behind back with other hand. Tilt head away until a gentle stretch is felt. Hold _20-30___ seconds. Repeat _1-2___ times per set. Do __1__ sets per session. Do _1-2___ sessions per day.  Copyright  VHI. All rights reserved.

## 2015-06-09 NOTE — Therapy (Signed)
[place]  [place], [place], [address] Phone: [phone]   Fax:  [phone]  Physical Therapy Evaluation  Patient Details  Name: Ariana Parks MRN: [id number] Date of Birth: 26-Oct-1980 Referring Provider: Dr Lynne Leader  Encounter Date: 06/09/2015      PT End of Session - 06/09/15 1614    Visit Number 1   Number of Visits 10   Date for PT Re-Evaluation 07/14/15   PT Start Time [id number]   PT Stop Time [id number]   PT Time Calculation (min) 44 min   Activity Tolerance Patient tolerated treatment well      Past Medical History  Diagnosis Date  . GERD (gastroesophageal reflux disease)   . Panic attack   . High cholesterol   . Depression   . PCOS (polycystic ovarian syndrome)     Past Surgical History  Procedure Laterality Date  . Tonsillectomy    . Knee surgery    . Wisdom tooth extraction      There were no vitals filed for this visit.  Visit Diagnosis:  Pain, neck - Plan: PT plan of care cert/re-cert  Stiffness of neck - Plan: PT plan of care cert/re-cert  Pain in shoulder region, left - Plan: PT plan of care cert/re-cert  Weakness of shoulder - Plan: PT plan of care cert/re-cert  Abnormal posture - Plan: PT plan of care cert/re-cert      Subjective Assessment - 06/09/15 1536    Subjective Pt reports she is having the same issues as back in Sept, however her shoulders don't hurt all the time any more.  Has weak grip  both sides, difficulty lifting and holding on to things.     Pertinent History no further testing since our last visit, has been working on her posture and performing some of the HEP, intercostal chondritis off/on   Patient Stated Goals look for traffic when switching lanes. get lifting restrictions raised to at least 25# so she can return to work.    Currently in Pain? Yes   Pain Score 2    Pain Location Neck   Pain Orientation Lateral;Right;Left   Pain Descriptors /  Indicators Tightness;Aching   Pain Type Chronic pain   Pain Onset More than a month ago   Pain Frequency Intermittent   Aggravating Factors  stretching and turning head, lift items, playing with her friends kids   Pain Relieving Factors medication             [facility] PT Assessment - 06/09/15 0001    Assessment   Medical Diagnosis Cervical Strain   Referring Provider Dr Lynne Leader   Onset Date/Surgical Date 01/04/15   Hand Dominance Left   Next MD Visit one month   Prior Therapy 2 visits in sept/oct of this year.    Precautions   Precautions --  15# lifting restrictions   Balance Screen   Has the patient fallen in the past 6 months No   Has the patient had a decrease in activity level because of a fear of falling?  No   Is the patient reluctant to leave their home because of a fear of falling?  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   Prior Function   Level of Independence Independent   Vocation Part time employment   Vocation Requirements [employer], lift up to 30#   Leisure cares for brother   Observation/Other Assessments   Focus on Therapeutic Outcomes (FOTO)  43% limited   ROM / Strength   AROM / PROM / Strength AROM;Strength   AROM   AROM Assessment Site Shoulder;Cervical   Right/Left Shoulder --  WNL, pain at endrange with all motions in neck.    Cervical Flexion 38   Cervical Extension WNL   Cervical - Right Rotation 38   Cervical - Left Rotation 42   Strength   Strength Assessment Site Shoulder;Hand   Right/Left Shoulder Left  RT WNL   Left Shoulder Flexion 4+/5   Left Shoulder Extension 5/5   Left Shoulder ABduction 4+/5   Left Shoulder Internal Rotation 5/5   Left Shoulder External Rotation 4+/5   Right/Left hand Right;Left   Right Hand Grip (lbs) 44   Left Hand Grip (lbs) 35   Flexibility   Soft Tissue Assessment /Muscle Length --  tight pecs.    Palpation   Spinal mobility very painful from C7-T8 with CPA and UPA mobs.     Palpation comment hypersenstive with touch in bilat UTs with trigger points.   very tight in bilat SCM and scalenes                   [facility] Adult PT Treatment/Exercise - 06/09/15 0001    Self-Care   Self-Care Posture  reviewed posture and previous HEP   Exercises   Exercises Neck   Neck Exercises: Seated   Other Seated Exercise scap retraction   Other Seated Exercise lateral side bend stretch   Neck Exercises: Supine   Other Supine Exercise 20 reps horizontal abdc and ER in hooklying with red band   Cryotherapy   Number Minutes Cryotherapy 10 Minutes   Cryotherapy Location Cervical  sitting   Type of Cryotherapy Ice pack                PT Education - 06/09/15 1604    Education provided Yes   Education Details HEP and reviewed posture/ lifting chest as opposed to pulling shoulders back.    Person(s) Educated Patient   Methods Explanation;Demonstration;Handout   Comprehension Returned demonstration;Verbalized understanding             PT Long Term Goals - 06/09/15 1618    PT LONG TERM GOAL #1   Title I with HEP ( 07/14/15)    Time 5   Period Weeks   Status New   PT LONG TERM GOAL #2   Title increase cervical ROM =/> 55 degrees bilat ( 07/14/15)    Time 5   Period Weeks   Status New   PT LONG TERM GOAL #3   Title report pain decrease =/> 50% ( 07/14/15)    Time 5   Period Weeks   Status New   PT LONG TERM GOAL #4   Title improve FOTO =/< 32% limited ( 07/14/15)    Time 5   Period Weeks   Status New   PT LONG TERM GOAL #5   Title tolerate lifting =/> 25# to allow return to work ( 07/14/15)    Time 5   Period Weeks   Status New               Plan - 06/09/15 1615    Clinical Impression Statement 34 yo female who was seen for two vists about 3 months ago, she has performed a couple of the exercises from then.  She is having the same neck issues, has poor posture, limited cervical motion, weakness in her UE's and pain. She is  hypersensative to touch in her upper shoulders and thoracic area, with tight musculature.    Pt will benefit from skilled therapeutic intervention in order to improve on the following deficits Postural dysfunction;Decreased strength;Hypomobility;Pain;Impaired UE functional use;Decreased range of motion   Rehab Potential Good   PT Frequency 2x / week   PT Duration --  5 wks   PT Treatment/Interventions Traction;Ultrasound;Patient/family education;Passive range of motion;Cryotherapy;Electrical Stimulation;Moist Heat;Therapeutic exercise;Manual techniques;Dry needling   PT Next Visit Plan postural realignment, modalities for pain, manual work as tolerated; trial of [place] for neck and shouder area - pt says she has responded well to [place] in the past    PT Home Exercise Plan postural correction; HEP   Consulted and Agree with Plan of Care Patient         Problem List Patient Active Problem List   Diagnosis Date Noted  . PCOS (polycystic ovarian syndrome) 05/31/2015  . Episode of heavy vaginal bleeding 05/31/2015  . Abnormal weight gain 03/10/2015  . Cervical strain 02/25/2015    Jeral Pinch PT 06/09/2015, 4:22 PM  [facility] [place], [place], [address] Phone: [phone]   Fax:  [phone]  Name: Aliveah Howery MRN: [id number] Date of Birth: 04-22-1981

## 2015-06-13 ENCOUNTER — Ambulatory Visit (INDEPENDENT_AMBULATORY_CARE_PROVIDER_SITE_OTHER): Payer: BLUE CROSS/BLUE SHIELD | Admitting: Physical Therapy

## 2015-06-13 DIAGNOSIS — R29898 Other symptoms and signs involving the musculoskeletal system: Secondary | ICD-10-CM | POA: Diagnosis not present

## 2015-06-13 DIAGNOSIS — M25512 Pain in left shoulder: Secondary | ICD-10-CM | POA: Diagnosis not present

## 2015-06-13 DIAGNOSIS — M542 Cervicalgia: Secondary | ICD-10-CM | POA: Diagnosis not present

## 2015-06-13 DIAGNOSIS — M436 Torticollis: Secondary | ICD-10-CM

## 2015-06-13 DIAGNOSIS — R293 Abnormal posture: Secondary | ICD-10-CM

## 2015-06-13 NOTE — Therapy (Signed)
[place], [place], [address] Phone: [phone]   Fax:  [phone]  Physical Therapy Treatment  Patient Details  Name: Ariana Parks MRN: [id number] Date of Birth: Sep 21, 1980 Referring Provider: Dr. Georgina Snell  Encounter Date: 06/13/2015      PT End of Session - 06/13/15 1611    Visit Number 2   Number of Visits 10   Date for PT Re-Evaluation 07/14/15   PT Start Time 1611  pt arrived late   PT Stop Time 1657   PT Time Calculation (min) 46 min      Past Medical History  Diagnosis Date  . GERD (gastroesophageal reflux disease)   . Panic attack   . High cholesterol   . Depression   . PCOS (polycystic ovarian syndrome)     Past Surgical History  Procedure Laterality Date  . Tonsillectomy    . Knee surgery    . Wisdom tooth extraction      There were no vitals filed for this visit.  Visit Diagnosis:  Pain, neck  Stiffness of neck  Pain in shoulder region, left  Weakness of shoulder  Abnormal posture          [facility] PT Assessment - 06/13/15 0001    Assessment   Medical Diagnosis Cervical Strain   Referring Provider Dr. Georgina Snell   Onset Date/Surgical Date 01/04/15   Hand Dominance Left   Next MD Visit one month   Prior Therapy 2 visits in sept/oct of this year.    AROM   Cervical - Right Rotation 50   Cervical - Left Rotation 50           [facility] Adult PT Treatment/Exercise - 06/13/15 0001    Neck Exercises: Supine   Cervical Rotation 5 reps;Right;Left   Shoulder Flexion Both;10 reps  AAROM with cane to increase ROM   Other Supine Exercise Shoulder press x 3 sec x 10 reps    Other Supine Exercise Bilat shoulder ABD with red band x 10, Bilateral shoulder ER with red band x 10 reps;  Sash with yellow x 10 reps each side : 2 sets of all three    Cryotherapy   Number Minutes Cryotherapy 10 Minutes   Cryotherapy Location Cervical;Shoulder  Lt    Type of Cryotherapy Ice  pack   Neck Exercises: Stretches   Upper Trapezius Stretch 1 rep;30 seconds  each side   Levator Stretch 1 rep;30 seconds  each side   Corner Stretch Limitations unilateral doorway stretch low, mid and high level x 30 sec each position, each side           PT Long Term Goals - 06/09/15 1618    PT LONG TERM GOAL #1   Title I with HEP ( 07/14/15)    Time 5   Period Weeks   Status New   PT LONG TERM GOAL #2   Title increase cervical ROM =/> 55 degrees bilat ( 07/14/15)    Time 5   Period Weeks   Status New   PT LONG TERM GOAL #3   Title report pain decrease =/> 50% ( 07/14/15)    Time 5   Period Weeks   Status New   PT LONG TERM GOAL #4   Title improve FOTO =/< 32% limited ( 07/14/15)    Time 5   Period Weeks   Status New   PT LONG TERM GOAL #5   Title tolerate lifting =/> 25# to allow  return to work ( 07/14/15)    Time 5   Period Weeks   Status New      Education: given yellow band and issued HEP with sash and cane shoulder exercise.        Plan - 06/13/15 1650    Clinical Impression Statement Pt demonstrated difficulty with seated exercise; tolerated supine neck and shoulder exercise with greater ease.  Pt demonstrated improved cervical rotation this visit.  Progressing towards established goals.    Pt will benefit from skilled therapeutic intervention in order to improve on the following deficits Postural dysfunction;Decreased strength;Hypomobility;Pain;Impaired UE functional use;Decreased range of motion   Rehab Potential Good   PT Frequency 2x / week   PT Duration --  5 wks   PT Treatment/Interventions Traction;Ultrasound;Patient/family education;Passive range of motion;Cryotherapy;Electrical Stimulation;Moist Heat;Therapeutic exercise;Manual techniques;Dry needling   PT Next Visit Plan continue postural realignment and shoulder ROM/strengthening, trial of [place] for neck and shoulder area.    Consulted and Agree with Plan of Care Patient        Problem  List Patient Active Problem List   Diagnosis Date Noted  . PCOS (polycystic ovarian syndrome) 05/31/2015  . Episode of heavy vaginal bleeding 05/31/2015  . Abnormal weight gain 03/10/2015  . Cervical strain 02/25/2015   Ariana Parks, PTA 06/13/2015 4:59 PM  [place], [place], [address] Phone: [phone]   Fax:  [phone]  Name: Ariana Parks MRN: [id number] Date of Birth: Nov 14, 1980

## 2015-06-13 NOTE — Patient Instructions (Signed)
Sash   On back, knees bent, feet flat, left hand on left hip, right hand above left. Pull right arm DIAGONALLY (hip to shoulder) across chest. Bring right arm along head toward floor. Hold momentarily. Slowly return to starting position. Repeat _10__ times, 2 sets. Do with left, then right arm. Band color __yellow____ (thumb up) Copyright  VHI. All rights reserved.   Cane Exercise: Flexion    Lie on back, holding cane above chest. Keeping arms as straight as possible, lower cane toward floor beyond head. Hold _1-5___ seconds. Repeat _10___ times. Do ___1_ sessions per day.  [email or web address]    [facility] [place], [place] [address]  [phone] (office) [phone] (fax)

## 2015-06-14 ENCOUNTER — Ambulatory Visit (INDEPENDENT_AMBULATORY_CARE_PROVIDER_SITE_OTHER): Payer: BLUE CROSS/BLUE SHIELD

## 2015-06-14 DIAGNOSIS — E282 Polycystic ovarian syndrome: Secondary | ICD-10-CM

## 2015-06-14 DIAGNOSIS — N939 Abnormal uterine and vaginal bleeding, unspecified: Secondary | ICD-10-CM

## 2015-06-15 ENCOUNTER — Encounter: Payer: Self-pay | Admitting: Rehabilitative and Restorative Service Providers"

## 2015-06-15 ENCOUNTER — Ambulatory Visit (INDEPENDENT_AMBULATORY_CARE_PROVIDER_SITE_OTHER): Payer: BLUE CROSS/BLUE SHIELD | Admitting: Rehabilitative and Restorative Service Providers"

## 2015-06-15 DIAGNOSIS — R293 Abnormal posture: Secondary | ICD-10-CM

## 2015-06-15 DIAGNOSIS — M25512 Pain in left shoulder: Secondary | ICD-10-CM | POA: Diagnosis not present

## 2015-06-15 DIAGNOSIS — M542 Cervicalgia: Secondary | ICD-10-CM

## 2015-06-15 DIAGNOSIS — M436 Torticollis: Secondary | ICD-10-CM | POA: Diagnosis not present

## 2015-06-15 DIAGNOSIS — R29898 Other symptoms and signs involving the musculoskeletal system: Secondary | ICD-10-CM | POA: Diagnosis not present

## 2015-06-15 NOTE — Progress Notes (Signed)
Quick Note:  Ultrasound shows a fibroid.  Ariana Parks can you please check on the OBGYN referral? ______

## 2015-06-15 NOTE — Patient Instructions (Signed)
Shoulder Blade Squeeze    Rotate shoulders back, then squeeze shoulder blades down and back. Hold 10 sec Repeat __10__ times. Do __several __ sessions per day.  Scapula Adduction With Pectoralis Stretch: Low - Standing   Shoulders at 45 hands even with shoulders, keeping weight through legs, shift weight forward until you feel pull or stretch through the front of your chest. Hold _30__ seconds. Do _3__ times, _2-4__ times per day.   Scapula Adduction With Pectoralis Stretch: Mid-Range - Standing   Shoulders at 90 elbows even with shoulders, keeping weight through legs, shift weight forward until you feel pull or strength through the front of your chest. Hold __30_ seconds. Do _3__ times, __2-4_ times per day.   Scapula Adduction With Pectoralis Stretch: High - Standing   Shoulders at 120 hands up high on the doorway, keeping weight on feet, shift weight forward until you feel pull or stretch through the front of your chest. Hold _30__ seconds. Do _3__ times, _2-3__ times per day.   Resisted External Rotation: in Neutral - Bilateral   PALMS UP Sit or stand, tubing in both hands, elbows at sides, bent to 90, forearms forward. Pinch shoulder blades together and rotate forearms out. Keep elbows at sides. Repeat __10__ times per set. Do _2-3___ sets per session. Do _2-3___ sessions per day.   Low Row: Standing   Face anchor, feet shoulder width apart. Palms up, pull arms back, squeezing shoulder blades together. Repeat 10__ times per set. Do 2-3__ sets per session. Do 2-3__ sessions per week. Anchor Height: Waist  Strengthening: Resisted Extension   Hold tubing in right hand, arm forward. Pull arm back, elbow straight. Repeat _10___ times per set. Do 2-3____ sets per session. Do 2-3____ sessions per day.

## 2015-06-15 NOTE — Therapy (Signed)
[place], [place], [address] Phone: [phone]   Fax:  [phone]  Physical Therapy Treatment  Patient Details  Name: Ariana Parks MRN: [id number] Date of Birth: August 09, 1980 Referring Provider: Dr. Georgina Snell  Encounter Date: 06/15/2015      PT End of Session - 06/15/15 1602    Visit Number 3   Number of Visits 10   Date for PT Re-Evaluation 07/14/15   PT Start Time 1602   PT Stop Time 1655   PT Time Calculation (min) 53 min   Activity Tolerance Patient tolerated treatment well      Past Medical History  Diagnosis Date  . GERD (gastroesophageal reflux disease)   . Panic attack   . High cholesterol   . Depression   . PCOS (polycystic ovarian syndrome)     Past Surgical History  Procedure Laterality Date  . Tonsillectomy    . Knee surgery    . Wisdom tooth extraction      There were no vitals filed for this visit.  Visit Diagnosis:  Pain, neck  Stiffness of neck  Pain in shoulder region, left  Weakness of shoulder  Abnormal posture      Subjective Assessment - 06/15/15 1602    Subjective patient reports that she was "pretty sore" after last PT appt.  Patient reports that her exercises are going well. She will be out of town next week but plans to continue PT the first week in January.    Currently in Pain? No/denies                         [facility] Adult PT Treatment/Exercise - 06/15/15 0001    Neuro Re-ed    Neuro Re-ed Details  working on posture and alignment    Exercises   Exercises Neck;Shoulder   Neck Exercises: Machines for Strengthening   UBE (Upper Arm Bike) L2 4 min - 2 fwd/2back    Neck Exercises: Standing   Other Standing Exercises scap squeeze 10 sec x 10 min with noodle    Neck Exercises: Supine   Neck Retraction 5 reps;10 secs   Cervical Rotation 5 reps;Right;Left   Shoulder Flexion Both;10 reps  AAROM with cane to increase ROM   Other Supine Exercise Shoulder press x 3 sec x 10 reps    Other Supine Exercise Bilat shoulder ABD with red band x 10, Bilateral shoulder ER with red band x 10 reps;  Sash with yellow x 10 reps each side    Cryotherapy   Number Minutes Cryotherapy 12 Minutes   Cryotherapy Location Cervical;Shoulder   Type of Cryotherapy Ice pack   Neck Exercises: Stretches   Upper Trapezius Stretch 30 seconds;2 reps  each side   Levator Stretch 30 seconds;2 reps  each side   Corner Stretch Limitations 3way doorway 30 sec x2 each position; biceps stretch 20 sec x 2                 PT Education - 06/15/15 1627    Education provided Yes   Education Details HEP    Person(s) Educated Patient   Methods Explanation;Demonstration;Tactile cues;Verbal cues;Handout   Comprehension Verbalized understanding;Returned demonstration;Verbal cues required;Tactile cues required             PT Long Term Goals - 06/09/15 1618    PT LONG TERM GOAL #1   Title I with HEP ( 07/14/15)    Time 5  Period Weeks   Status New   PT LONG TERM GOAL #2   Title increase cervical ROM =/> 55 degrees bilat ( 07/14/15)    Time 5   Period Weeks   Status New   PT LONG TERM GOAL #3   Title report pain decrease =/> 50% ( 07/14/15)    Time 5   Period Weeks   Status New   PT LONG TERM GOAL #4   Title improve FOTO =/< 32% limited ( 07/14/15)    Time 5   Period Weeks   Status New   PT LONG TERM GOAL #5   Title tolerate lifting =/> 25# to allow return to work ( 07/14/15)    Time 5   Period Weeks   Status New               Plan - 06/15/15 1647    Clinical Impression Statement Tolerated exercises well. increased exercises without difficulty. Progresing toward stated goals of therapy.    Pt will benefit from skilled therapeutic intervention in order to improve on the following deficits Postural dysfunction;Decreased strength;Hypomobility;Pain;Impaired UE functional use;Decreased range of motion   Rehab Potential  Good   PT Frequency 2x / week   PT Duration --  5 weeks    PT Treatment/Interventions Traction;Ultrasound;Patient/family education;Passive range of motion;Cryotherapy;Electrical Stimulation;Moist Heat;Therapeutic exercise;Manual techniques;Dry needling   PT Home Exercise Plan postural correction; HEP   Recommended Other Services HEP   Consulted and Agree with Plan of Care Patient        Problem List Patient Active Problem List   Diagnosis Date Noted  . PCOS (polycystic ovarian syndrome) 05/31/2015  . Episode of heavy vaginal bleeding 05/31/2015  . Abnormal weight gain 03/10/2015  . Cervical strain 02/25/2015    Elana Jian Nilda Simmer PT, MPH 06/15/2015, 4:49 PM  [facility] [place], [place], [address] Phone: [phone]   Fax:  [phone]  Name: Ariana Parks MRN: [id number] Date of Birth: [birth year]

## 2015-06-22 ENCOUNTER — Encounter: Payer: BLUE CROSS/BLUE SHIELD | Admitting: Physical Therapy

## 2015-06-24 ENCOUNTER — Encounter: Payer: BLUE CROSS/BLUE SHIELD | Admitting: Physical Therapy

## 2015-06-29 ENCOUNTER — Ambulatory Visit (INDEPENDENT_AMBULATORY_CARE_PROVIDER_SITE_OTHER): Payer: BLUE CROSS/BLUE SHIELD | Admitting: Physical Therapy

## 2015-06-29 DIAGNOSIS — M25512 Pain in left shoulder: Secondary | ICD-10-CM

## 2015-06-29 DIAGNOSIS — R29898 Other symptoms and signs involving the musculoskeletal system: Secondary | ICD-10-CM

## 2015-06-29 DIAGNOSIS — M542 Cervicalgia: Secondary | ICD-10-CM

## 2015-06-29 DIAGNOSIS — R293 Abnormal posture: Secondary | ICD-10-CM

## 2015-06-29 DIAGNOSIS — M436 Torticollis: Secondary | ICD-10-CM

## 2015-06-29 NOTE — Therapy (Signed)
[place], [place], [address] Phone: [phone]   Fax:  [phone]  Physical Therapy Treatment  Patient Details  Name: Ariana Parks MRN: [id number] Date of Birth: 1980-09-02 Referring Provider: Dr. Georgina Snell  Encounter Date: 06/29/2015      PT End of Session - 06/29/15 1536    Visit Number 4   Number of Visits 10   Date for PT Re-Evaluation 07/14/15   PT Start Time 5732  pt arrived late   PT Stop Time 1619   PT Time Calculation (min) 45 min   Activity Tolerance No increased pain;Patient tolerated treatment well      Past Medical History  Diagnosis Date  . GERD (gastroesophageal reflux disease)   . Panic attack   . High cholesterol   . Depression   . PCOS (polycystic ovarian syndrome)     Past Surgical History  Procedure Laterality Date  . Tonsillectomy    . Knee surgery    . Wisdom tooth extraction      There were no vitals filed for this visit.  Visit Diagnosis:  Pain, neck  Stiffness of neck  Pain in shoulder region, left  Abnormal posture  Weakness of shoulder      Subjective Assessment - 06/29/15 1536    Subjective Pt reports she has been traveling and dealing with stressful news.  "It's been rough"  Pt reports having stiffness in Rt neck and posterior shoulders.    Currently in Pain? Yes   Pain Score 4    Pain Location Back   Pain Orientation Upper   Pain Descriptors / Indicators Cramping   Aggravating Factors  bending to pick up stuff    Pain Relieving Factors laying down.             [facility] PT Assessment - 06/29/15 0001    Assessment   Medical Diagnosis Cervical Strain   Referring Provider Dr. Georgina Snell   Onset Date/Surgical Date 01/04/15   Hand Dominance Left   Next MD Visit not scheduled.    AROM   AROM Assessment Site Cervical   Cervical Flexion 58   Cervical Extension WNL   Cervical - Right Side Bend 32  with pain   Cervical - Left Side Bend 40    Cervical - Right Rotation 65   Cervical - Left Rotation 60   Strength   Right Hand Grip (lbs) 55,50   Left Hand Grip (lbs) 50,60            [facility] Adult PT Treatment/Exercise - 06/29/15 0001    Neck Exercises: Machines for Strengthening   UBE (Upper Arm Bike) L2 4 min - 2 fwd/2back    Neck Exercises: Supine   Shoulder Flexion 10 reps  overhead pull with red band (bilat)    Other Supine Exercise Bilat shoulder ABD with red band x 10, Bilateral shoulder ER with red band x 10 reps;  Sash with red x 10 reps each side    Shoulder Exercises: Standing   External Rotation Strengthening;Both;Theraband;12 reps   Theraband Level (Shoulder External Rotation) Level 2 (Red)   Cryotherapy   Cryotherapy Location Cervical;Shoulder   Type of Cryotherapy Ice pack   Neck Exercises: Stretches   Upper Trapezius Stretch 30 seconds;4 reps  each side   Levator Stretch 30 seconds;2 reps  each side   Corner Stretch Limitations 3way doorway 30 sec x2 each position; biceps stretch 20 sec x 2    Other Neck Stretches cervical  rotation x 5 reps each direction             PT Long Term Goals - 06/29/15 1713    PT LONG TERM GOAL #1   Title I with HEP ( 07/14/15)    Time 5   Period Weeks   Status On-going   PT LONG TERM GOAL #2   Title increase cervical ROM =/> 55 degrees bilat ( 07/14/15)    Time 5   Period Weeks   Status Achieved   PT LONG TERM GOAL #3   Title report pain decrease =/> 50% ( 07/14/15)    Time 5   Period Weeks   Status On-going   PT LONG TERM GOAL #4   Title improve FOTO =/< 32% limited ( 07/14/15)    Time 5   Period Weeks   Status On-going   PT LONG TERM GOAL #5   Title tolerate lifting =/> 25# to allow return to work ( 07/14/15)    Time 5   Period Weeks   Status On-going               Plan - 06/29/15 1555    Clinical Impression Statement Pt has demonstrated improved cervical ROM with decreased pain level; has met LTG #2.  Pt tolerated increased resistance with  sash exercise without increased pain.  Progressing towards remaining goals.    Pt will benefit from skilled therapeutic intervention in order to improve on the following deficits Postural dysfunction;Decreased strength;Hypomobility;Pain;Impaired UE functional use;Decreased range of motion   Rehab Potential Good   PT Frequency 2x / week   PT Duration --  5 wks    PT Treatment/Interventions Traction;Ultrasound;Patient/family education;Passive range of motion;Cryotherapy;Electrical Stimulation;Moist Heat;Therapeutic exercise;Manual techniques;Dry needling   PT Next Visit Plan trial of lifting, if tolerated.  Continue progressive postural strengthing/ stretching.    Consulted and Agree with Plan of Care Patient        Problem List Patient Active Problem List   Diagnosis Date Noted  . PCOS (polycystic ovarian syndrome) 05/31/2015  . Episode of heavy vaginal bleeding 05/31/2015  . Abnormal weight gain 03/10/2015  . Cervical strain 02/25/2015   Kerin Perna, PTA 06/29/2015 5:14 PM  [facility] [place], [place], [address] Phone: [phone]   Fax:  [phone]  Name: Tamica Covell MRN: [id number] Date of Birth: 03-May-1981

## 2015-07-01 ENCOUNTER — Ambulatory Visit (INDEPENDENT_AMBULATORY_CARE_PROVIDER_SITE_OTHER): Payer: BLUE CROSS/BLUE SHIELD | Admitting: Physical Therapy

## 2015-07-01 DIAGNOSIS — R29898 Other symptoms and signs involving the musculoskeletal system: Secondary | ICD-10-CM | POA: Diagnosis not present

## 2015-07-01 DIAGNOSIS — M542 Cervicalgia: Secondary | ICD-10-CM

## 2015-07-01 DIAGNOSIS — R293 Abnormal posture: Secondary | ICD-10-CM

## 2015-07-01 DIAGNOSIS — M436 Torticollis: Secondary | ICD-10-CM

## 2015-07-01 NOTE — Therapy (Signed)
[place], [place], [address] Phone: [phone]   Fax:  [phone]  Physical Therapy Treatment  Patient Details  Name: Ariana Parks MRN: [id number] Date of Birth: 1981-06-08 Referring Provider: Dr. Georgina Snell  Encounter Date: 07/01/2015      PT End of Session - 07/01/15 1504    Visit Number 5   Number of Visits 10   Date for PT Re-Evaluation 07/14/15   PT Start Time [id number]  pt arrived late   PT Stop Time [id number]   PT Time Calculation (min) 47 min   Activity Tolerance Patient tolerated treatment well;No increased pain      Past Medical History  Diagnosis Date  . GERD (gastroesophageal reflux disease)   . Panic attack   . High cholesterol   . Depression   . PCOS (polycystic ovarian syndrome)     Past Surgical History  Procedure Laterality Date  . Tonsillectomy    . Knee surgery    . Wisdom tooth extraction      There were no vitals filed for this visit.  Visit Diagnosis:  Pain, neck  Stiffness of neck  Abnormal posture  Weakness of shoulder      Subjective Assessment - 07/01/15 1459    Subjective Pt reports she has some mild pain the rest of day of last treatment, but it resolved and she has felt pretty good.  Pt complains of toothache. No new changes.    Currently in Pain? No/denies            [facility] PT Assessment - 07/01/15 0001    Assessment   Medical Diagnosis Cervical Strain   Referring Provider Dr. Georgina Snell   Onset Date/Surgical Date 01/04/15   Hand Dominance Left   Next MD Visit not scheduled.            [place] Adult PT Treatment/Exercise - 07/01/15 0001    Neck Exercises: Machines for Strengthening   UBE (Upper Arm Bike) L2 4 min - 2 fwd/2back    Shoulder Exercises: Seated   Extension Both;10 reps;Theraband   Theraband Level (Shoulder Extension) Level 2 (Red)   Row Both;10 reps;Theraband  2 sets   Theraband Level (Shoulder Row) Level 2 (Red)   Horizontal  ABduction Both;10 reps;Theraband  2 sets   Theraband Level (Shoulder Horizontal ABduction) Level 2 (Red)   External Rotation Strengthening;Both;10 reps;Theraband  2 sets   Theraband Level (Shoulder External Rotation) Level 2 (Red)   Shoulder Exercises: Standing   Other Standing Exercises lifting of 3-5# packages from eye level to waist level to simulate work activities   Other Standing Exercises Standing Rt lunge with resisted shoulder abd/horiz abd to simulated moving bags of packages x 15 reps - VC to use legs/ improved form.    Cryotherapy   Number Minutes Cryotherapy 12 Minutes   Cryotherapy Location Cervical   Type of Cryotherapy Ice pack   Neck Exercises: Stretches   Upper Trapezius Stretch 30 seconds;4 reps  each side   Levator Stretch 30 seconds;2 reps  each side   Corner Stretch Limitations 3way doorway 30 sec x2 each position; biceps stretch 20 sec x 2    Other Neck Stretches cervical rotation x 5 reps each direction                 PT Education - 07/01/15 1532    Education provided Yes   Education Details HEP   Person(s) Educated Patient   Methods Handout;Demonstration;Explanation   Comprehension  Verbalized understanding;Returned demonstration             PT Long Term Goals - 06/29/15 1713    PT LONG TERM GOAL #1   Title I with HEP ( 07/14/15)    Time 5   Period Weeks   Status On-going   PT LONG TERM GOAL #2   Title increase cervical ROM =/> 55 degrees bilat ( 07/14/15)    Time 5   Period Weeks   Status Achieved   PT LONG TERM GOAL #3   Title report pain decrease =/> 50% ( 07/14/15)    Time 5   Period Weeks   Status On-going   PT LONG TERM GOAL #4   Title improve FOTO =/< 32% limited ( 07/14/15)    Time 5   Period Weeks   Status On-going   PT LONG TERM GOAL #5   Title tolerate lifting =/> 25# to allow return to work ( 07/14/15)    Time 5   Period Weeks   Status On-going               Plan - 07/01/15 1638    Clinical Impression  Statement Pt tolerated seated and standing exercises with no increase in pain in shoulder/neck.  Progressing well towards goals.    Pt will benefit from skilled therapeutic intervention in order to improve on the following deficits Postural dysfunction;Decreased strength;Hypomobility;Pain;Impaired UE functional use;Decreased range of motion   Rehab Potential Good   PT Frequency 2x / week   PT Duration --  5 wk   PT Treatment/Interventions Traction;Ultrasound;Patient/family education;Passive range of motion;Cryotherapy;Electrical Stimulation;Moist Heat;Therapeutic exercise;Manual techniques;Dry needling   PT Next Visit Plan .  Continue progressive postural strengthing/ stretching.    Consulted and Agree with Plan of Care Patient        Problem List Patient Active Problem List   Diagnosis Date Noted  . PCOS (polycystic ovarian syndrome) 05/31/2015  . Episode of heavy vaginal bleeding 05/31/2015  . Abnormal weight gain 03/10/2015  . Cervical strain 02/25/2015    Kerin Perna, PTA 07/01/2015 4:40 PM  [facility] [place], [place], [address] Phone: [phone]   Fax:  [phone]  Name: Ariana Parks MRN: [id number] Date of Birth: 27-May-1981

## 2015-07-01 NOTE — Patient Instructions (Signed)
Resisted External Rotation: in Neutral - Bilateral   PALMS UP Sit or stand, tubing in both hands, elbows at sides, bent to 90, forearms forward. Pinch shoulder blades together and rotate forearms out. Keep elbows at sides. Repeat __10__ times per set. Do _2-3___ sets per session. Do _2-3___ sessions per day.   Low Row: Standing   Face anchor, feet shoulder width apart. Palms up, pull arms back, squeezing shoulder blades together. Repeat 10__ times per set. Do 2-3__ sets per session. Do 2-3__ sessions per week. Anchor Height: Waist     Strengthening: Resisted Extension   Hold tubing in right hand, arm forward. Pull arm back, elbow straight. Repeat _10___ times per set. Do 2-3____ sets per session. Do 2-3____ sessions per day.   [facility] [place], [place] [address]  [phone] (office) [phone] (fax)

## 2015-07-05 ENCOUNTER — Ambulatory Visit (INDEPENDENT_AMBULATORY_CARE_PROVIDER_SITE_OTHER): Payer: BLUE CROSS/BLUE SHIELD | Admitting: Obstetrics & Gynecology

## 2015-07-05 ENCOUNTER — Encounter: Payer: Self-pay | Admitting: Obstetrics & Gynecology

## 2015-07-05 VITALS — BP 126/74 | HR 75 | Resp 16 | Ht 69.25 in | Wt 291.0 lb

## 2015-07-05 DIAGNOSIS — N938 Other specified abnormal uterine and vaginal bleeding: Secondary | ICD-10-CM

## 2015-07-05 DIAGNOSIS — E669 Obesity, unspecified: Secondary | ICD-10-CM

## 2015-07-05 MED ORDER — MEDROXYPROGESTERONE ACETATE 10 MG PO TABS
10.0000 mg | ORAL_TABLET | Freq: Every day | ORAL | Status: DC
Start: 2015-07-05 — End: 2016-10-30

## 2015-07-05 NOTE — Progress Notes (Signed)
   Subjective:    Patient ID: Ariana Parks, female    DOB: July 07, 1980, 35 y.o.   MRN: [id number]  HPI 35 yo SW G0 referred here by Dr. Georgina Snell  for evaluation of a fibroid (3.8) seen on u/s recently. This u/s was order by Dr. Georgina Snell for evaluation of her DUB.  She was diagnosed with PCOS as a young woman. She describes her periods as "extrememly heavy and painful", can last 7-9+ day. Her endometrium was very thin on u/s. She had menarche at 35 yo and skips periods but she is not sure how many she will skip in a year. She used OCPs in the past and had regular periods with them. She is a smoker and her mother doesn't want her to take OCPs. She does not want an IUD or a Nexplanon.   Review of Systems  She has been abstinent for at least 4+ years.  Her last pap was 2015 and it was reportedly normal.     Objective:   Physical Exam Obese WF, no eye contact Breathing, conversing, and ambulating normally       Assessment & Plan:  DUB- I will prescribe provera 10 mg cycliclly for 10 days per month. Rec weight loss

## 2015-07-06 ENCOUNTER — Ambulatory Visit (INDEPENDENT_AMBULATORY_CARE_PROVIDER_SITE_OTHER): Payer: BLUE CROSS/BLUE SHIELD | Admitting: Physical Therapy

## 2015-07-06 DIAGNOSIS — M542 Cervicalgia: Secondary | ICD-10-CM

## 2015-07-06 DIAGNOSIS — R293 Abnormal posture: Secondary | ICD-10-CM

## 2015-07-06 DIAGNOSIS — R29898 Other symptoms and signs involving the musculoskeletal system: Secondary | ICD-10-CM | POA: Diagnosis not present

## 2015-07-06 DIAGNOSIS — M436 Torticollis: Secondary | ICD-10-CM | POA: Diagnosis not present

## 2015-07-06 NOTE — Therapy (Signed)
[place], [place], [address] Phone: [phone]   Fax:  [phone]  Physical Therapy Treatment  Patient Details  Name: Ariana Parks MRN: [id number] Date of Birth: 12-02-1980 Referring Provider: Dr. Georgina Snell   Encounter Date: 07/06/2015      PT End of Session - 07/06/15 1432    Visit Number 6   Number of Visits 10   Date for PT Re-Evaluation 07/14/15   PT Start Time 5638   PT Stop Time 1522   PT Time Calculation (min) 50 min   Activity Tolerance Patient tolerated treatment well;No increased pain      Past Medical History  Diagnosis Date  . GERD (gastroesophageal reflux disease)   . Panic attack   . High cholesterol   . Depression   . PCOS (polycystic ovarian syndrome)   . Abnormal Pap smear of cervix 2005    Mod dysplasia    Past Surgical History  Procedure Laterality Date  . Tonsillectomy    . Knee surgery    . Wisdom tooth extraction    . Leep      There were no vitals filed for this visit.  Visit Diagnosis:  Pain, neck  Stiffness of neck  Abnormal posture  Weakness of shoulder      Subjective Assessment - 07/06/15 1434    Subjective Pt reports she lost her exercise band over the last few days, but has been performing stretches for arms/neck.  Pt reports her pain has reduced by better than 50%.    Currently in Pain? No/denies            [facility] PT Assessment - 07/06/15 0001    Assessment   Medical Diagnosis Cervical Strain   Referring Provider Dr. Georgina Snell    Onset Date/Surgical Date 01/04/15   Hand Dominance Left   Next MD Visit not scheduled.            [facility] Adult PT Treatment/Exercise - 07/06/15 0001    Neck Exercises: Machines for Strengthening   UBE (Upper Arm Bike) L3: 3 min forward, 2 min backward.    Shoulder Exercises: Supine   Other Supine Exercises shoulder diagonals x 10 reps each shoulder with green band.    Shoulder Exercises: Standing   Horizontal ABduction Strengthening;Both;10 reps;Theraband  2 set   Theraband Level (Shoulder Horizontal ABduction) Level 3 (Green)   External Rotation Strengthening;Both;10 reps;Theraband  2 set   Theraband Level (Shoulder External Rotation) Level 2 (Red)   Flexion Left;10 reps;Theraband   Theraband Level (Shoulder Flexion) Level 2 (Red);Level 3 (Green)   Flexion Limitations to 90 deg to simulate throwing package bag    Extension Both;10 reps;Theraband;Strengthening  2 set   Theraband Level (Shoulder Extension) Level 3 (Green)   Row Both;10 reps;Theraband  2 sets   Theraband Level (Shoulder Row) Level 3 (Green)   Other Standing Exercises Lifting and tossing 10# bag x 2 reps, repeated with 16 # x 2 reps (simulate job task)    Other Standing Exercises Standing bicep (bilateral) curl with 11# x 10 reps x 2 sets    Shoulder Exercises: Stretch   Cross Chest Stretch 2 reps;20 seconds   Cryotherapy   Number Minutes Cryotherapy 12 Minutes   Cryotherapy Location Cervical   Type of Cryotherapy Ice pack   Neck Exercises: Stretches   Upper Trapezius Stretch 30 seconds;4 reps  each side   Levator Stretch 30 seconds;2 reps  each side   Corner Stretch Limitations 3  way doorway 30 sec x2 each position; biceps stretch 20 sec x 2   2 sets   Other Neck Stretches cervical rotation x 5 reps each direction                 PT Education - 07/06/15 1508    Education provided Yes   Education Details HEP added resisted flexion    Person(s) Educated Patient   Methods Explanation;Handout   Comprehension Verbalized understanding             PT Long Term Goals - 07/06/15 1511    PT LONG TERM GOAL #1   Title I with HEP ( 07/14/15)    Time 5   Period Weeks   Status On-going   PT LONG TERM GOAL #2   Title increase cervical ROM =/> 55 degrees bilat ( 07/14/15)    Time 5   Period Weeks   Status Achieved   PT LONG TERM GOAL #3   Title report pain decrease =/> 50% ( 07/14/15)    Time 5    Period Weeks   Status Achieved   PT LONG TERM GOAL #4   Title improve FOTO =/< 32% limited ( 07/14/15)    Time 5   Period Weeks   Status On-going   PT LONG TERM GOAL #5   Title tolerate lifting =/> 25# to allow return to work ( 07/14/15)    Time 5   Period Weeks   Status On-going               Plan - 07/06/15 1510    Clinical Impression Statement Pt tolerated increased resistance with shoulder exercises with no increase in pain in shoulder/neck.  Making good gains towards goals.  Pt has met LTG#3 with reported pain reduction by at least 50%.    Pt will benefit from skilled therapeutic intervention in order to improve on the following deficits Postural dysfunction;Decreased strength;Hypomobility;Pain;Impaired UE functional use;Decreased range of motion   Rehab Potential Good   PT Frequency 2x / week   PT Duration --  5 wks   PT Treatment/Interventions Traction;Ultrasound;Patient/family education;Passive range of motion;Cryotherapy;Electrical Stimulation;Moist Heat;Therapeutic exercise;Manual techniques;Dry needling   PT Next Visit Plan .  Continue progressive postural strengthing/ stretching.    Consulted and Agree with Plan of Care Patient        Problem List Patient Active Problem List   Diagnosis Date Noted  . PCOS (polycystic ovarian syndrome) 05/31/2015  . Episode of heavy vaginal bleeding 05/31/2015  . Abnormal weight gain 03/10/2015  . Cervical strain 02/25/2015   Kerin Perna, PTA 07/06/2015 3:20 PM   [facility] [place], [place], [address] Phone: [phone]   Fax:  [phone]  Name: Ariana Parks MRN: [id number] Date of Birth: [birth year]

## 2015-07-06 NOTE — Patient Instructions (Signed)
Strengthening: Resisted Flexion    Hold tubing with left arm at side. Pull forward and up. Move shoulder through pain-free range of motion. (Step and throw motion, simulating work tasks) Repeat _10___ times per set. Do __2__ sets per session. Do _1___ sessions per day.   [facility] [place], [place] [address]  [phone] (office) [phone] (fax)

## 2015-07-08 ENCOUNTER — Encounter: Payer: Self-pay | Admitting: Rehabilitative and Restorative Service Providers"

## 2015-07-08 ENCOUNTER — Ambulatory Visit (INDEPENDENT_AMBULATORY_CARE_PROVIDER_SITE_OTHER): Payer: BLUE CROSS/BLUE SHIELD | Admitting: Rehabilitative and Restorative Service Providers"

## 2015-07-08 DIAGNOSIS — M25512 Pain in left shoulder: Secondary | ICD-10-CM

## 2015-07-08 DIAGNOSIS — M542 Cervicalgia: Secondary | ICD-10-CM

## 2015-07-08 DIAGNOSIS — R29898 Other symptoms and signs involving the musculoskeletal system: Secondary | ICD-10-CM | POA: Diagnosis not present

## 2015-07-08 DIAGNOSIS — R293 Abnormal posture: Secondary | ICD-10-CM | POA: Diagnosis not present

## 2015-07-08 DIAGNOSIS — M436 Torticollis: Secondary | ICD-10-CM

## 2015-07-08 NOTE — Therapy (Signed)
[place], [place], [address] Phone: [phone]   Fax:  [phone]  Physical Therapy Treatment  Patient Details  Name: Ariana Parks MRN: [id number] Date of Birth: 1981-04-06 Referring Provider: Dr. Georgina Snell   Encounter Date: 07/08/2015      PT End of Session - 07/08/15 1454    Visit Number 7   Number of Visits 10   Date for PT Re-Evaluation 07/14/15   PT Start Time [id number]   PT Stop Time [id number]   PT Time Calculation (min) 55 min   Activity Tolerance Patient tolerated treatment well      Past Medical History  Diagnosis Date  . GERD (gastroesophageal reflux disease)   . Panic attack   . High cholesterol   . Depression   . PCOS (polycystic ovarian syndrome)   . Abnormal Pap smear of cervix 2005    Mod dysplasia    Past Surgical History  Procedure Laterality Date  . Tonsillectomy    . Knee surgery    . Wisdom tooth extraction    . Leep      There were no vitals filed for this visit.  Visit Diagnosis:  Pain, neck  Stiffness of neck  Abnormal posture  Weakness of shoulder  Pain in shoulder region, left      Subjective Assessment - 07/08/15 1455    Subjective Patient reports that her shoulder and neck is feeling pretty good. She worked out hard on Wednesday so she is a "little sore" today. She is trying to get ready to go back to work.    Currently in Pain? Yes   Pain Score 2    Pain Location Shoulder   Pain Orientation Right;Left   Pain Descriptors / Indicators Sore   Pain Type Chronic pain   Pain Onset More than a month ago   Pain Frequency Intermittent                         [facility] Adult PT Treatment/Exercise - 07/08/15 0001    Neck Exercises: Machines for Strengthening   UBE (Upper Arm Bike) L3: 3 min forward, 2 min backward.    Shoulder Exercises: Seated   Horizontal ABduction Both;10 reps;Theraband  2 sets   Theraband Level (Shoulder Horizontal  ABduction) Level 3 (Green)   Other Seated Exercises diagonals 10 each direction green band    Shoulder Exercises: Standing   Horizontal ABduction Strengthening;Both;10 reps;Theraband  2 set   Theraband Level (Shoulder Horizontal ABduction) Level 3 (Green)   External Rotation Strengthening;Both;10 reps;Theraband  2 set   Theraband Level (Shoulder External Rotation) Level 2 (Red)   Flexion Left;10 reps;Theraband   Theraband Level (Shoulder Flexion) Level 2 (Red);Level 3 (Green)   Flexion Limitations to 90 deg to simulate throwing package bag    Extension Both;10 reps;Theraband;Strengthening  2 set   Theraband Level (Shoulder Extension) Level 3 (Green)   Row Both;10 reps;Theraband  2 sets   Theraband Level (Shoulder Row) Level 3 (Green)   Other Standing Exercises Lifting and tossing 20# bag x 2 reps each UE   Other Standing Exercises Standing bicep (bilateral) curl with 11# x 10 reps x 2 sets    Shoulder Exercises: ROM/Strengthening   Wall Pushups 20 reps   Shoulder Exercises: Stretch   Cross Chest Stretch 3 reps;30 seconds   Shoulder Exercises: Body Blade   Flexion 60 seconds;2 reps  each UE    Neck Exercises: Stretches  Corner Stretch Limitations 3 way doorway 30 sec x2 each position; biceps stretch 20 sec x 2   2 sets                PT Education - 07/08/15 1529    Education provided Yes   Education Details HEP    Person(s) Educated Patient   Methods Explanation   Comprehension Verbalized understanding             PT Long Term Goals - 07/06/15 1511    PT LONG TERM GOAL #1   Title I with HEP ( 07/14/15)    Time 5   Period Weeks   Status On-going   PT LONG TERM GOAL #2   Title increase cervical ROM =/> 55 degrees bilat ( 07/14/15)    Time 5   Period Weeks   Status Achieved   PT LONG TERM GOAL #3   Title report pain decrease =/> 50% ( 07/14/15)    Time 5   Period Weeks   Status Achieved   PT LONG TERM GOAL #4   Title improve FOTO =/< 32% limited (  07/14/15)    Time 5   Period Weeks   Status On-going   PT LONG TERM GOAL #5   Title tolerate lifting =/> 25# to allow return to work ( 07/14/15)    Time 5   Period Weeks   Status On-going               Plan - 07/08/15 1529    Clinical Impression Statement Some soreness from last PT session - no significant increase in pain. Tolerated incresaed exercise without difficulty.    Pt will benefit from skilled therapeutic intervention in order to improve on the following deficits Postural dysfunction;Decreased strength;Hypomobility;Pain;Impaired UE functional use;Decreased range of motion   Rehab Potential Good   PT Frequency 2x / week   PT Duration --  5 wks   PT Treatment/Interventions Traction;Ultrasound;Patient/family education;Passive range of motion;Cryotherapy;Electrical Stimulation;Moist Heat;Therapeutic exercise;Manual techniques;Dry needling   PT Next Visit Plan .  Continue progressive postural strengthing/ stretching.    PT Home Exercise Plan postural correction; HEP   Consulted and Agree with Plan of Care Patient        Problem List Patient Active Problem List   Diagnosis Date Noted  . PCOS (polycystic ovarian syndrome) 05/31/2015  . Episode of heavy vaginal bleeding 05/31/2015  . Abnormal weight gain 03/10/2015  . Cervical strain 02/25/2015    Celyn Nilda Simmer PT, MPH  07/08/2015, 3:34 PM  [facility] [place], [place], [address] Phone: [phone]   Fax:  [phone]  Name: Ariana Parks MRN: [id number] Date of Birth: [birth year]

## 2015-07-11 ENCOUNTER — Encounter: Payer: Self-pay | Admitting: Family Medicine

## 2015-07-11 ENCOUNTER — Ambulatory Visit (INDEPENDENT_AMBULATORY_CARE_PROVIDER_SITE_OTHER): Payer: BLUE CROSS/BLUE SHIELD | Admitting: Family Medicine

## 2015-07-11 VITALS — BP 114/77 | HR 89 | Wt 294.0 lb

## 2015-07-11 DIAGNOSIS — S161XXA Strain of muscle, fascia and tendon at neck level, initial encounter: Secondary | ICD-10-CM

## 2015-07-11 DIAGNOSIS — F988 Other specified behavioral and emotional disorders with onset usually occurring in childhood and adolescence: Secondary | ICD-10-CM

## 2015-07-11 DIAGNOSIS — F909 Attention-deficit hyperactivity disorder, unspecified type: Secondary | ICD-10-CM

## 2015-07-11 DIAGNOSIS — E282 Polycystic ovarian syndrome: Secondary | ICD-10-CM | POA: Diagnosis not present

## 2015-07-11 DIAGNOSIS — F32A Depression, unspecified: Secondary | ICD-10-CM

## 2015-07-11 DIAGNOSIS — F329 Major depressive disorder, single episode, unspecified: Secondary | ICD-10-CM | POA: Diagnosis not present

## 2015-07-11 MED ORDER — VENLAFAXINE HCL 37.5 MG PO TABS: 37.5000 mg | ORAL_TABLET | Freq: Two times a day (BID) | ORAL | Status: DC

## 2015-07-11 NOTE — Patient Instructions (Signed)
Thank you for coming in today. Return in 1-2 weeks.  We will refer for ADHD testing.   Start effexor.

## 2015-07-11 NOTE — Assessment & Plan Note (Signed)
Doing well continue physical therapy. Continue work restriction

## 2015-07-11 NOTE — Assessment & Plan Note (Signed)
Start effexor. Refer psych Recheck 1-2 weeks.

## 2015-07-11 NOTE — Assessment & Plan Note (Signed)
Refer psych

## 2015-07-11 NOTE — Progress Notes (Addendum)
Ariana Parks is a 35 y.o. female who presents to [facility] today for follow-up neck pain, PCR analysis, and discuss anxiety/depression.  1) neck pain: Doing well with physical therapy. Patient continues to have a 15 pound weight restriction based on physical therapy recommendations. Overall well and improving.  2) irregular heavy bleeding: Doing well with Provera per OB/GYN. No significant abdominal pain.  3) anxiety and depression. Worsening recently. Patient denies any active suicidal or homicidal ideation but does have a passive death wish. In the past she's been on many different medicines for anxiety and depression including Zoloft, Prozac, Paxil, Celexa, Wellbutrin, Xanax, Lamictal, Abilify, and trazodone. She's never been on SNRI that she can think of.  4) ADHD: Patient in the past was diagnosed with ADHD. She plans to attend college in the near future and would like to consider restarting medicines for ADHD.   Past Medical History  Diagnosis Date  . GERD (gastroesophageal reflux disease)   . Panic attack   . High cholesterol   . Depression   . PCOS (polycystic ovarian syndrome)   . Abnormal Pap smear of cervix 2005    Mod dysplasia   Past Surgical History  Procedure Laterality Date  . Tonsillectomy    . Knee surgery    . Wisdom tooth extraction    . Leep     Social History  Substance Use Topics  . Smoking status: Current Every Day Smoker -- 0.50 packs/day for 15 years    Types: Cigarettes  . Smokeless tobacco: Never Used     Comment: using vapes  . Alcohol Use: 0.0 oz/week    0 Standard drinks or equivalent per week   family history includes GER disease in her father; Heart attack in her other; Hypertension in her mother; Migraines in her father; Thyroid disease in her brother and mother.  ROS as above Medications: Current Outpatient Prescriptions    Medication Sig Dispense Refill  . Calcium Carbonate Antacid (ANTACID PO) Take by mouth.    . methocarbamol (ROBAXIN) 500 MG tablet Take 500 mg by mouth 4 (four) times daily.    . naproxen sodium (ANAPROX) 220 MG tablet Take 220 mg by mouth.    . ranitidine (ZANTAC) 150 MG capsule Take 150 mg by mouth.    . medroxyPROGESTERone (PROVERA) 10 MG tablet Use 1 pill for 10 days on day 16 of cycle (Patient not taking: Reported on 07/11/2015) 30 tablet 2  . medroxyPROGESTERone (PROVERA) 10 MG tablet Take 1 tablet (10 mg total) by mouth daily. Use for ten days (Patient not taking: Reported on 07/11/2015) 10 tablet 12  . venlafaxine (EFFEXOR) 37.5 MG tablet Take 1 tablet (37.5 mg total) by mouth 2 (two) times daily. 60 tablet 0   No current facility-administered medications for this visit.   Allergies  Allergen Reactions  . Celexa [Citalopram Hydrobromide]   . Ibuprofen     High doses  . Prednisone   . Prilosec [Omeprazole]   . Wellbutrin [Bupropion]   . Flexeril [Cyclobenzaprine] Other (See Comments)    Dreams     Exam:  BP 114/77 mmHg  Pulse 89  Wt 294 lb (133.358 kg)  LMP 07/21/2014 Gen: Well NAD HEENT: EOMI,  MMM Lungs: Normal work of breathing. CTABL Heart: RRR no MRG Abd: NABS, Soft. Nondistended, Nontender Exts: Brisk capillary refill, warm and well perfused.  Neck: Nontender to midline normal neck range of motion upper shortage strength is intact throughout. Psych:  Alert and oriented normal speech and thought process. Slightly tearful affect. No active SI or HI. Patient is a mild passive death wish. No plan.  GAD 7 is 18 PHQ9 is 22  No results found for this or any previous visit (from the past 24 hour(s)). No results found.   Please see individual assessment and plan sections.

## 2015-07-11 NOTE — Assessment & Plan Note (Signed)
Doing well. Continue Provera

## 2015-07-13 ENCOUNTER — Ambulatory Visit (INDEPENDENT_AMBULATORY_CARE_PROVIDER_SITE_OTHER): Payer: BLUE CROSS/BLUE SHIELD | Admitting: Physical Therapy

## 2015-07-13 DIAGNOSIS — M542 Cervicalgia: Secondary | ICD-10-CM

## 2015-07-13 DIAGNOSIS — R293 Abnormal posture: Secondary | ICD-10-CM

## 2015-07-13 DIAGNOSIS — M436 Torticollis: Secondary | ICD-10-CM

## 2015-07-13 DIAGNOSIS — R29898 Other symptoms and signs involving the musculoskeletal system: Secondary | ICD-10-CM | POA: Diagnosis not present

## 2015-07-13 NOTE — Therapy (Signed)
[place], [place], [address] Phone: [phone]   Fax:  [phone]  Physical Therapy Treatment  Patient Details  Name: Ariana Parks MRN: [id number] Date of Birth: Nov 04, 1980 Referring Provider: Dr. Georgina Snell   Encounter Date: 07/13/2015      PT End of Session - 07/13/15 1441    Visit Number 8   Number of Visits 10   Date for PT Re-Evaluation 07/14/15   PT Start Time 1430   PT Stop Time 1524   PT Time Calculation (min) 54 min   Activity Tolerance Patient tolerated treatment well      Past Medical History  Diagnosis Date  . GERD (gastroesophageal reflux disease)   . Panic attack   . High cholesterol   . Depression   . PCOS (polycystic ovarian syndrome)   . Abnormal Pap smear of cervix 2005    Mod dysplasia    Past Surgical History  Procedure Laterality Date  . Tonsillectomy    . Knee surgery    . Wisdom tooth extraction    . Leep      There were no vitals filed for this visit.  Visit Diagnosis:  Pain, neck  Stiffness of neck  Abnormal posture  Weakness of shoulder      Subjective Assessment - 07/13/15 1444    Subjective Pt reports she was sore after throwing the 20# sack last visit.  She felt it was a little too soon.     Currently in Pain? No/denies   Pain Score 7             [facility] PT Assessment - 07/13/15 0001    Assessment   Medical Diagnosis Cervical Strain   Referring Provider Dr. Georgina Snell    Onset Date/Surgical Date 01/04/15   Hand Dominance Left   AROM   Cervical - Right Side Bend 50   Cervical - Left Side Bend 40   Cervical - Right Rotation 80   Cervical - Left Rotation 80   Strength   Right/Left Shoulder Left   Left Shoulder Flexion 5/5   Left Shoulder Extension 5/5   Left Shoulder ABduction 5/5   Left Shoulder Internal Rotation 5/5   Left Shoulder External Rotation 4+/5            [facility] Adult PT Treatment/Exercise - 07/13/15 0001    Neck  Exercises: Machines for Strengthening   UBE (Upper Arm Bike) L3: 3 min forward, 2 min backward.    Shoulder Exercises: Supine   Horizontal ABduction Both;10 reps;Theraband  2 sets   External Rotation Strengthening;20 reps;Both   Other Supine Exercises shoulder diagonals x 10 reps each shoulder with green band.    Shoulder Exercises: Standing   External Rotation Strengthening;Left;5 reps;Theraband   Theraband Level (Shoulder External Rotation) Level 3 (Green)   Flexion 5 reps;Left;Theraband  1 set 5 with green, 2 sets of 5 with blue   Theraband Level (Shoulder Flexion) Level 3 (Green);Level 4 (Blue)   Flexion Limitations to 90 deg to simulate throwing package bag    Other Standing Exercises Lifting 2#, 5# overhead x 10 reps each weight (simulate overhead work)  Rt/ Lt    Other Standing Exercises Standing bicep (bilateral) curl with 13# x 15 reps    Cryotherapy   Number Minutes Cryotherapy 12 Minutes   Cryotherapy Location Cervical   Type of Cryotherapy Ice pack   Neck Exercises: Stretches   Upper Trapezius Stretch 30 seconds;4 reps  each side, 2  sets   Levator Stretch 30 seconds;2 reps  each side, 2 sets   Corner Stretch Limitations 3 way doorway 30 sec x2 each position; biceps stretch 20 sec x 2   2 sets                     PT Long Term Goals - 07/06/15 1511    PT LONG TERM GOAL #1   Title I with HEP ( 07/14/15)    Time 5   Period Weeks   Status On-going   PT LONG TERM GOAL #2   Title increase cervical ROM =/> 55 degrees bilat ( 07/14/15)    Time 5   Period Weeks   Status Achieved   PT LONG TERM GOAL #3   Title report pain decrease =/> 50% ( 07/14/15)    Time 5   Period Weeks   Status Achieved   PT LONG TERM GOAL #4   Title improve FOTO =/< 32% limited ( 07/14/15)    Time 5   Period Weeks   Status On-going   PT LONG TERM GOAL #5   Title tolerate lifting =/> 25# to allow return to work ( 07/14/15)    Time 5   Period Weeks   Status On-going                Plan - 07/13/15 1513    Clinical Impression Statement Pt demonstrated improved UE strength and cervical ROM this visit.  Pt tolerated increased resistance with exercise without increase in pain, just muscular fatigue.  Progressing well towards goals.    Pt will benefit from skilled therapeutic intervention in order to improve on the following deficits Postural dysfunction;Decreased strength;Hypomobility;Pain;Impaired UE functional use;Decreased range of motion   Rehab Potential Good   PT Frequency 2x / week   PT Duration --  5 wks   PT Treatment/Interventions Traction;Ultrasound;Patient/family education;Passive range of motion;Cryotherapy;Electrical Stimulation;Moist Heat;Therapeutic exercise;Manual techniques;Dry needling   PT Next Visit Plan .  Continue progressive postural strengthing/ stretching.    Consulted and Agree with Plan of Care Patient        Problem List Patient Active Problem List   Diagnosis Date Noted  . PCOS (polycystic ovarian syndrome) 05/31/2015  . Abnormal weight gain 03/10/2015  . Cervical strain 02/25/2015  . Abnormal EEG 01/02/2012  . ADD (attention deficit disorder) 01/02/2012  . Anxiety 01/02/2012  . Clinical depression 01/02/2012  . Current tobacco use 01/02/2012    Ariana Parks, PTA 07/13/2015 3:17 PM  [facility] [place], [place], [address] Phone: [phone]   Fax:  [phone]  Name: Ariana Parks MRN: [id number] Date of Birth: 1980-06-27

## 2015-07-15 ENCOUNTER — Ambulatory Visit (INDEPENDENT_AMBULATORY_CARE_PROVIDER_SITE_OTHER): Payer: BLUE CROSS/BLUE SHIELD | Admitting: Physical Therapy

## 2015-07-15 DIAGNOSIS — R293 Abnormal posture: Secondary | ICD-10-CM

## 2015-07-15 DIAGNOSIS — M436 Torticollis: Secondary | ICD-10-CM

## 2015-07-15 DIAGNOSIS — M542 Cervicalgia: Secondary | ICD-10-CM | POA: Diagnosis not present

## 2015-07-15 DIAGNOSIS — R29898 Other symptoms and signs involving the musculoskeletal system: Secondary | ICD-10-CM | POA: Diagnosis not present

## 2015-07-15 NOTE — Therapy (Signed)
[facility] [address] [place], [place], [address] Phone: [phone]   Fax:  [phone]  Physical Therapy Treatment  Patient Details  Name: Ariana Parks MRN: [id number] Date of Birth: 11/20/1980 Referring Provider: Dr. Corey  Encounter Date: 07/15/2015      PT End of Session - 07/15/15 1533    Visit Number 9   Number of Visits 17   Date for PT Re-Evaluation 08/15/15   PT Start Time 1455  pt arrived late   PT Stop Time 1545   PT Time Calculation (min) 50 min   Activity Tolerance Patient tolerated treatment well      Past Medical History  Diagnosis Date  . GERD (gastroesophageal reflux disease)   . Panic attack   . High cholesterol   . Depression   . PCOS (polycystic ovarian syndrome)   . Abnormal Pap smear of cervix 2005    Mod dysplasia    Past Surgical History  Procedure Laterality Date  . Tonsillectomy    . Knee surgery    . Wisdom tooth extraction    . Leep      There were no vitals filed for this visit.  Visit Diagnosis:  Pain, neck - Plan: PT plan of care cert/re-cert  Stiffness of neck - Plan: PT plan of care cert/re-cert  Abnormal posture - Plan: PT plan of care cert/re-cert  Weakness of shoulder - Plan: PT plan of care cert/re-cert      Subjective Assessment - 07/15/15 1505    Subjective Pt reports no new changes since last visit.  She states the "lifting last session was enough without it causing any problems"    Currently in Pain? No/denies            [facility] PT Assessment - 07/15/15 0001    Assessment   Medical Diagnosis Cervical Strain   Referring Provider Dr. Corey   Onset Date/Surgical Date 01/04/15   Hand Dominance Left           [facility] Adult PT Treatment/Exercise - 07/15/15 0001    Neck Exercises: Machines for Strengthening   UBE (Upper Arm Bike) L3: 3 min forward, 2 min backward.    Shoulder Exercises: Standing   External Rotation Both;10 reps;Theraband   Theraband Level (Shoulder External Rotation) Level 3 (Green)   Flexion Both;5 reps  (overhead)  2 sets, mirror for visual cues of posture  Blue Band to 90 deg as simulated bag throw x 10 reps each arm    Theraband Level (Shoulder Flexion) Level 3 (Green)   Other Standing Exercises Rings on hoop at highest level (12) to build endurance in neck/shoulder Lt to/from Rt each arm.    Shoulder Exercises: Stretch   Cross Chest Stretch 3 reps;30 seconds each arm    Shoulder Exercises: Body Blade   Flexion 30 seconds;1 rep  each UE  challenging   ABduction 30 seconds;1 rep  each arm challenging   Cryotherapy   Number Minutes Cryotherapy 12 Minutes   Cryotherapy Location Cervical   Type of Cryotherapy Ice pack   Neck Exercises: Stretches   Upper Trapezius Stretch 30 seconds;4 reps  each side, 2 sets   Levator Stretch 30 seconds;2 reps  each side, 2 sets   Corner Stretch Limitations 3 way doorway 30 sec x2 each position; biceps stretch 20 sec x 2   2 sets                     PT   Long Term Goals - 07/15/15 1535    PT LONG TERM GOAL #1   Title I with HEP ( 08/12/15)    Time 5   Period Weeks   Status On-going   PT LONG TERM GOAL #2   Title increase cervical ROM =/> 55 degrees bilat ( 07/14/15)    Time 5   Period Weeks   Status Achieved   PT LONG TERM GOAL #3   Title report pain decrease =/> 50% ( 07/14/15)    Time 5   Period Weeks   Status Achieved   PT LONG TERM GOAL #4   Title improve FOTO =/< 32% limited ( 08/12/15)    Time 5   Period Weeks   Status On-going  scored 35% on 07/15/15   PT LONG TERM GOAL #5   Title tolerate lifting =/> 25# to allow return to work ( 08/12/15)    Time 5   Period Weeks   Status On-going  can only tolerate small trials of 20#.                Plan - 07/15/15 1559    Clinical Impression Statement Pt tolerated all new exercises with no increase in pain, just muscular fatigue especially in Rt upper trap.  Pt has partially met her  goals and is making good gains.  Pt is interested in continuation of therapy to increase strength and endurance so as to return to work; pt will benefit from additional sessions to meet established goals.    Pt will benefit from skilled therapeutic intervention in order to improve on the following deficits Postural dysfunction;Decreased strength;Hypomobility;Pain;Impaired UE functional use;Decreased range of motion   Rehab Potential Good   PT Frequency 2x / week   PT Duration 4 weeks   PT Treatment/Interventions Traction;Ultrasound;Patient/family education;Passive range of motion;Cryotherapy;Electrical Stimulation;Moist Heat;Therapeutic exercise;Manual techniques;Dry needling   PT Next Visit Plan Spoke to supervising PT regarding pt's progress.  Will continue progressive postural strengthing/ stretching with simulated work exercises.    Consulted and Agree with Plan of Care Patient        Problem List Patient Active Problem List   Diagnosis Date Noted  . PCOS (polycystic ovarian syndrome) 05/31/2015  . Abnormal weight gain 03/10/2015  . Cervical strain 02/25/2015  . Abnormal EEG 01/02/2012  . ADD (attention deficit disorder) 01/02/2012  . Anxiety 01/02/2012  . Clinical depression 01/02/2012  . Current tobacco use 01/02/2012   Kerin Perna, PTA 07/15/2015 4:19 PM   Jeral Pinch, PT 07/15/2015 4:19 PM   [facility] [place], [place], [address] Phone: [phone]   Fax:  [phone]  Name: Ariana Parks MRN: [id number] Date of Birth: 1981/04/14

## 2015-07-18 ENCOUNTER — Ambulatory Visit (INDEPENDENT_AMBULATORY_CARE_PROVIDER_SITE_OTHER): Payer: BLUE CROSS/BLUE SHIELD | Admitting: Physical Therapy

## 2015-07-18 DIAGNOSIS — R293 Abnormal posture: Secondary | ICD-10-CM

## 2015-07-18 DIAGNOSIS — M436 Torticollis: Secondary | ICD-10-CM | POA: Diagnosis not present

## 2015-07-18 DIAGNOSIS — R29898 Other symptoms and signs involving the musculoskeletal system: Secondary | ICD-10-CM | POA: Diagnosis not present

## 2015-07-18 NOTE — Therapy (Signed)
[place], [place], [address] Phone: [phone]   Fax:  [phone]  Physical Therapy Treatment  Patient Details  Name: Ariana Parks MRN: [id number] Date of Birth: 04-12-1981 Referring Provider: Dr. Georgina Snell   Encounter Date: 07/18/2015      PT End of Session - 07/18/15 1524    Visit Number 10   Number of Visits 17   Date for PT Re-Evaluation 08/15/15   PT Start Time 1520   PT Stop Time [id number]   PT Time Calculation (min) 53 min   Activity Tolerance Patient tolerated treatment well;No increased pain      Past Medical History  Diagnosis Date  . GERD (gastroesophageal reflux disease)   . Panic attack   . High cholesterol   . Depression   . PCOS (polycystic ovarian syndrome)   . Abnormal Pap smear of cervix 2005    Mod dysplasia    Past Surgical History  Procedure Laterality Date  . Tonsillectomy    . Knee surgery    . Wisdom tooth extraction    . Leep      There were no vitals filed for this visit.  Visit Diagnosis:  Stiffness of neck  Abnormal posture  Weakness of shoulder      Subjective Assessment - 07/18/15 1524    Subjective Pt reports she was very sore after last session, believes it was the body blade. Pain resolved with rest.    Currently in Pain? No/denies            [facility] PT Assessment - 07/18/15 0001    Assessment   Medical Diagnosis Cervical Strain   Referring Provider Dr. Georgina Snell    Onset Date/Surgical Date 01/04/15   Hand Dominance Left           [facility] Adult PT Treatment/Exercise - 07/18/15 0001    Neck Exercises: Machines for Strengthening   UBE (Upper Arm Bike) L5: alternating directions for 4 total minutes.    Shoulder Exercises: Seated   Horizontal ABduction Strengthening;Both;10 reps;Theraband   Theraband Level (Shoulder Horizontal ABduction) Level 3 (Green)   External Rotation Strengthening;Both;12 reps;Theraband   Theraband Level (Shoulder  External Rotation) Level 3 (Green)   Other Seated Exercises D2 flex with green band x 12 each side    Other Seated Exercises bicep curl with green band x 10 reps eccentric control.    Shoulder Exercises: Prone   Flexion Strengthening;Right;Left;10 reps   Other Prone Exercises bilat T's x 10 reps; Y's x 5 reps; goal post bilat x 10 reps    Shoulder Exercises: Standing   Other Standing Exercises Rings on hoop at highest level (12) to build endurance in neck/shoulder - each side Lt to/from Rt.    Shoulder Exercises: ROM/Strengthening   Wall Pushups --  12 reps   Shoulder Exercises: Stretch   Cross Chest Stretch 2 reps;30 seconds   Other Shoulder Stretches tricep stretch 30 sec x 2 reps each arm.    Cryotherapy   Number Minutes Cryotherapy 12 Minutes   Cryotherapy Location Cervical   Type of Cryotherapy Ice pack   Neck Exercises: Stretches   Upper Trapezius Stretch 30 seconds;4 reps  each side, 2 sets   Levator Stretch 30 seconds;2 reps  each side, 2 sets   Corner Stretch Limitations 3 way doorway 30 sec x2 each position; biceps stretch 20 sec x 2   2 sets           PT Long Term  Goals - 07/15/15 1535    PT LONG TERM GOAL #1   Title I with HEP ( 08/12/15)    Time 5   Period Weeks   Status On-going   PT LONG TERM GOAL #2   Title increase cervical ROM =/> 55 degrees bilat ( 07/14/15)    Time 5   Period Weeks   Status Achieved   PT LONG TERM GOAL #3   Title report pain decrease =/> 50% ( 07/14/15)    Time 5   Period Weeks   Status Achieved   PT LONG TERM GOAL #4   Title improve FOTO =/< 32% limited ( 08/12/15)    Time 5   Period Weeks   Status On-going  scored 35% on 07/15/15   PT LONG TERM GOAL #5   Title tolerate lifting =/> 25# to allow return to work ( 08/12/15)    Time 5   Period Weeks   Status On-going  can only tolerate small trials of 20#.                Plan - 07/18/15 1605    Clinical Impression Statement Pt tolerated new exercises without increase in  pain, just muscular fatigue.    Pt progressing towards remaining goals.    Pt will benefit from skilled therapeutic intervention in order to improve on the following deficits Postural dysfunction;Decreased strength;Hypomobility;Pain;Impaired UE functional use;Decreased range of motion   Rehab Potential Good   PT Frequency 2x / week   PT Duration 4 weeks   PT Treatment/Interventions Traction;Ultrasound;Patient/family education;Passive range of motion;Cryotherapy;Electrical Stimulation;Moist Heat;Therapeutic exercise;Manual techniques;Dry needling   PT Next Visit Plan continue progressive postural strengthing/ stretching with simulated work exercises. Trial of increased weight with simulated lifts.    Consulted and Agree with Plan of Care Patient        Problem List Patient Active Problem List   Diagnosis Date Noted  . PCOS (polycystic ovarian syndrome) 05/31/2015  . Abnormal weight gain 03/10/2015  . Cervical strain 02/25/2015  . Abnormal EEG 01/02/2012  . ADD (attention deficit disorder) 01/02/2012  . Anxiety 01/02/2012  . Clinical depression 01/02/2012  . Current tobacco use 01/02/2012    Kerin Perna, PTA 07/18/2015 4:07 PM  [place], [place], [address] Phone: [phone]   Fax:  [phone]  Name: Ariana Parks MRN: [id number] Date of Birth: 1981/01/15

## 2015-07-20 ENCOUNTER — Encounter: Payer: BLUE CROSS/BLUE SHIELD | Admitting: Physical Therapy

## 2015-07-22 ENCOUNTER — Encounter: Payer: Self-pay | Admitting: Rehabilitative and Restorative Service Providers"

## 2015-07-22 ENCOUNTER — Ambulatory Visit (INDEPENDENT_AMBULATORY_CARE_PROVIDER_SITE_OTHER): Payer: BLUE CROSS/BLUE SHIELD | Admitting: Rehabilitative and Restorative Service Providers"

## 2015-07-22 DIAGNOSIS — R293 Abnormal posture: Secondary | ICD-10-CM

## 2015-07-22 DIAGNOSIS — M436 Torticollis: Secondary | ICD-10-CM | POA: Diagnosis not present

## 2015-07-22 DIAGNOSIS — R29898 Other symptoms and signs involving the musculoskeletal system: Secondary | ICD-10-CM | POA: Diagnosis not present

## 2015-07-22 DIAGNOSIS — M25512 Pain in left shoulder: Secondary | ICD-10-CM

## 2015-07-22 DIAGNOSIS — M542 Cervicalgia: Secondary | ICD-10-CM | POA: Diagnosis not present

## 2015-07-22 NOTE — Therapy (Signed)
[place], [place], [address] Phone: [phone]   Fax:  [phone]  Physical Therapy Treatment  Patient Details  Name: Ariana Parks MRN: [id number] Date of Birth: 1980-07-25 Referring Provider: Dr. Georgina Snell  Encounter Date: 07/22/2015      PT End of Session - 07/22/15 1401    Visit Number 11   Number of Visits 17   Date for PT Re-Evaluation 08/15/15   PT Start Time [id number]   PT Stop Time 1451   PT Time Calculation (min) 48 min   Activity Tolerance Patient tolerated treatment well;No increased pain      Past Medical History  Diagnosis Date  . GERD (gastroesophageal reflux disease)   . Panic attack   . High cholesterol   . Depression   . PCOS (polycystic ovarian syndrome)   . Abnormal Pap smear of cervix 2005    Mod dysplasia    Past Surgical History  Procedure Laterality Date  . Tonsillectomy    . Knee surgery    . Wisdom tooth extraction    . Leep      There were no vitals filed for this visit.  Visit Diagnosis:  Stiffness of neck  Abnormal posture  Weakness of shoulder  Pain, neck  Pain in shoulder region, left      Subjective Assessment - 07/22/15 1403    Subjective Back hurts - probably from sleeping on her stomach - can't sleep in other positions; for short periods she can sleep on her side. Stomach doesn't feel good today. May have eaten something that didn't agree with her. Sees Dr. Georgina Snell Tuesday, 07/26/15.   Currently in Pain? No/denies            [facility] PT Assessment - 07/22/15 0001    Assessment   Medical Diagnosis Cervical Strain   Referring Provider Dr. Georgina Snell   Onset Date/Surgical Date 01/04/15   Hand Dominance Left   Next MD Visit 07/26/15   AROM   Cervical Flexion 61   Cervical Extension 70   Cervical - Right Side Bend 54   Cervical - Left Side Bend 49   Cervical - Right Rotation 82   Cervical - Left Rotation 78   Strength   Left Shoulder  Flexion 5/5   Left Shoulder Extension 5/5   Left Shoulder ABduction 5/5   Left Shoulder Internal Rotation 5/5   Left Shoulder External Rotation 5/5   Right/Left hand --  grasp - average of 3 trials each hand   Right Hand Grip (lbs) 64.33   Left Hand Grip (lbs) 63.0                     [facility] Adult PT Treatment/Exercise - 07/22/15 0001    Neck Exercises: Machines for Strengthening   UBE (Upper Arm Bike) L5: alternating directions for 4 total minutes.    Shoulder Exercises: Seated   Horizontal ABduction Strengthening;Both;10 reps;Theraband   Theraband Level (Shoulder Horizontal ABduction) Level 3 (Green)   External Rotation Strengthening;Both;12 reps;Theraband   Theraband Level (Shoulder External Rotation) Level 3 (Green)   Other Seated Exercises D2 flex with green band x 12 each side    Other Seated Exercises bicep curl with green band x 10 reps eccentric control.    Shoulder Exercises: Standing   External Rotation Both;10 reps;Theraband   Theraband Level (Shoulder External Rotation) Level 3 (Green)   Flexion Right;Left;10 reps  simulating toss for work    Theraband Level (Shoulder  Flexion) Level 4 (Blue)   Flexion Limitations to 90 deg to simulate throwing package bag    Extension Both;10 reps;Theraband;Strengthening   Theraband Level (Shoulder Extension) Level 3 (Green)   Row Both;10 reps;Theraband   Theraband Level (Shoulder Row) Level 3 (Green)   Other Standing Exercises Rings on hoop at highest level (12) to build endurance in neck/shoulder - each side Lt to/from Rt.    Shoulder Exercises: Therapy Ball   Other Therapy Ball Exercises bouncing ball on wall at head height 1 min    Other Therapy Ball Exercises rolling ball overhead 1 min    Shoulder Exercises: ROM/Strengthening   Wall Pushups 20 reps   Shoulder Exercises: Stretch   Cross Chest Stretch 2 reps;30 seconds   Other Shoulder Stretches tricep stretch 30 sec x 2 reps each arm.    Cryotherapy   Number  Minutes Cryotherapy 12 Minutes   Cryotherapy Location Shoulder  bilat   Type of Cryotherapy Ice pack   Neck Exercises: Stretches   Upper Trapezius Stretch 30 seconds;4 reps  each side, 2 sets   Levator Stretch 30 seconds;2 reps  each side, 2 sets   Corner Stretch Limitations 3 way doorway 30 sec x2 each position; biceps stretch 20 sec x 2   2 sets                     PT Long Term Goals - 07/15/15 1535    PT LONG TERM GOAL #1   Title I with HEP ( 08/12/15)    Time 5   Period Weeks   Status On-going   PT LONG TERM GOAL #2   Title increase cervical ROM =/> 55 degrees bilat ( 07/14/15)    Time 5   Period Weeks   Status Achieved   PT LONG TERM GOAL #3   Title report pain decrease =/> 50% ( 07/14/15)    Time 5   Period Weeks   Status Achieved   PT LONG TERM GOAL #4   Title improve FOTO =/< 32% limited ( 08/12/15)    Time 5   Period Weeks   Status On-going  scored 35% on 07/15/15   PT LONG TERM GOAL #5   Title tolerate lifting =/> 25# to allow return to work ( 08/12/15)    Time 5   Period Weeks   Status On-going  can only tolerate small trials of 20#.                Plan - 07/22/15 1401    Clinical Impression Statement Continues to progress toward functional goals with incresaed exercise tolerance and work simulation tasks.    Pt will benefit from skilled therapeutic intervention in order to improve on the following deficits Postural dysfunction;Decreased strength;Hypomobility;Pain;Impaired UE functional use;Decreased range of motion   Rehab Potential Good   PT Frequency 2x / week   PT Duration 4 weeks   PT Treatment/Interventions Traction;Ultrasound;Patient/family education;Passive range of motion;Cryotherapy;Electrical Stimulation;Moist Heat;Therapeutic exercise;Manual techniques;Dry needling   PT Next Visit Plan continue progressive postural strengthing/ stretching with simulated work exercises. Trial of increased weight with simulated lifts as tolerated.     PT Home Exercise Plan postural correction; HEP   Consulted and Agree with Plan of Care Patient        Problem List Patient Active Problem List   Diagnosis Date Noted  . PCOS (polycystic ovarian syndrome) 05/31/2015  . Abnormal weight gain 03/10/2015  . Cervical strain 02/25/2015  . Abnormal EEG 01/02/2012  .  ADD (attention deficit disorder) 01/02/2012  . Anxiety 01/02/2012  . Clinical depression 01/02/2012  . Current tobacco use 01/02/2012    Rielly Brunn Nilda Simmer PT, MPH  07/22/2015, 2:44 PM  [facility] [place], [place], [address] Phone: [phone]   Fax:  [phone]  Name: Kambry Aimone MRN: [id number] Date of Birth: [birth year]

## 2015-07-25 ENCOUNTER — Encounter: Payer: Self-pay | Admitting: Rehabilitative and Restorative Service Providers"

## 2015-07-25 ENCOUNTER — Ambulatory Visit (INDEPENDENT_AMBULATORY_CARE_PROVIDER_SITE_OTHER): Payer: BLUE CROSS/BLUE SHIELD | Admitting: Rehabilitative and Restorative Service Providers"

## 2015-07-25 DIAGNOSIS — R293 Abnormal posture: Secondary | ICD-10-CM

## 2015-07-25 DIAGNOSIS — R29898 Other symptoms and signs involving the musculoskeletal system: Secondary | ICD-10-CM | POA: Diagnosis not present

## 2015-07-25 DIAGNOSIS — M436 Torticollis: Secondary | ICD-10-CM

## 2015-07-25 DIAGNOSIS — M25512 Pain in left shoulder: Secondary | ICD-10-CM

## 2015-07-25 DIAGNOSIS — M542 Cervicalgia: Secondary | ICD-10-CM | POA: Diagnosis not present

## 2015-07-25 NOTE — Therapy (Signed)
[place], [place], [address] Phone: [phone]   Fax:  [phone]  Physical Therapy Treatment  Patient Details  Name: Ariana Parks MRN: [id number] Date of Birth: June 06, 1981 Referring Provider: Dr. Georgina Snell  Encounter Date: 07/25/2015      PT End of Session - 07/25/15 1118    Visit Number 12   Number of Visits 17   Date for PT Re-Evaluation 08/15/15   PT Start Time 1107   PT Stop Time 1159   PT Time Calculation (min) 52 min   Activity Tolerance Patient tolerated treatment well;Patient limited by pain      Past Medical History  Diagnosis Date  . GERD (gastroesophageal reflux disease)   . Panic attack   . High cholesterol   . Depression   . PCOS (polycystic ovarian syndrome)   . Abnormal Pap smear of cervix 2005    Mod dysplasia    Past Surgical History  Procedure Laterality Date  . Tonsillectomy    . Knee surgery    . Wisdom tooth extraction    . Leep      There were no vitals filed for this visit.  Visit Diagnosis:  Stiffness of neck  Abnormal posture  Weakness of shoulder  Pain, neck  Pain in shoulder region, left      Subjective Assessment - 07/25/15 1118    Subjective Patient still sleeping on stomach so she awakens with incresed neck discomfort and stiffness. Pt reports that the stiffness in her neck and shoudlers is better. She is still concerned about RTW because of the the about of weight she has to lift at work. Reports that she has to lift  25 pounds in job description but she does not have have lift that amount at current work position. She lifts 2-5 pounds up to 10 pounds throughout the work day. She works part time ~ 4 hr/day.   Currently in Pain? Yes   Pain Score 2    Pain Location Shoulder   Pain Orientation Right;Left   Pain Descriptors / Indicators Sore   Pain Type Chronic pain   Pain Onset More than a month ago   Pain Frequency Intermittent   Aggravating Factors  bending to lift; lifting    Pain Relieving Factors lying down             [facility] PT Assessment - 07/25/15 0001    Assessment   Medical Diagnosis Cervical Strain   Referring Provider Dr. Georgina Snell   Onset Date/Surgical Date 01/04/15   Hand Dominance Left   Next MD Visit 07/26/15   AROM   Cervical Flexion 61   Cervical Extension 70   Cervical - Right Side Bend 54   Cervical - Left Side Bend 49   Cervical - Right Rotation 82   Cervical - Left Rotation 78   Strength   Left Shoulder Flexion 5/5   Left Shoulder Extension 5/5   Left Shoulder ABduction 5/5   Left Shoulder Internal Rotation 5/5   Left Shoulder External Rotation 5/5   Right/Left hand --  average of 3 trials each hand    Right Hand Grip (lbs) 64.33   Left Hand Grip (lbs) 63.0                     [facility] Adult PT Treatment/Exercise - 07/25/15 0001    Therapeutic Activites    Therapeutic Activities --  functional bag toss work simulation 9# x 5 each  hand    Neck Exercises: Machines for Strengthening   UBE (Upper Arm Bike) L5: alternating directions for 4 total minutes.    Shoulder Exercises: Seated   Horizontal ABduction Strengthening;Both;10 reps;Theraband   Theraband Level (Shoulder Horizontal ABduction) Level 3 (Green)   External Rotation Strengthening;Both;12 reps;Theraband   Theraband Level (Shoulder External Rotation) Level 3 (Green)   Other Seated Exercises D2 flex with green band x 12 each side    Other Seated Exercises bicep curl with green band x 10 reps eccentric control.    Shoulder Exercises: Standing   External Rotation Both;10 reps;Theraband   Theraband Level (Shoulder External Rotation) Level 3 (Green)   Flexion Right;Left;10 reps  simulating toss for work    Theraband Level (Shoulder Flexion) Level 4 (Blue)   Flexion Limitations to 90 deg to simulate throwing package bag    Extension Both;10 reps;Theraband;Strengthening   Theraband Level (Shoulder Extension) Level 3  (Green)   Row Both;10 reps;Theraband   Theraband Level (Shoulder Row) Level 3 (Green)   Other Standing Exercises Rings on hoop at highest level (12) to build endurance in neck/shoulder - each side Lt to/from Rt.    Other Standing Exercises lifting 25 pounds - moving from waist to hip height and back x 5    Shoulder Exercises: Stretch   Cross Chest Stretch 2 reps;30 seconds   Other Shoulder Stretches tricep stretch 30 sec x 2 reps each arm.    Cryotherapy   Number Minutes Cryotherapy 14 Minutes   Cryotherapy Location Shoulder  bilat   Type of Cryotherapy Ice pack   Neck Exercises: Stretches   Upper Trapezius Stretch 30 seconds;4 reps  each side, 2 sets   Levator Stretch 30 seconds;2 reps  each side, 2 sets   Corner Stretch Limitations 3 way doorway 30 sec x2 each position; biceps stretch 20 sec x 2   2 sets                     PT Long Term Goals - 07/25/15 1336    PT LONG TERM GOAL #1   Title I with HEP ( 08/12/15)    Time 5   Period Weeks   Status On-going   PT LONG TERM GOAL #2   Title increase cervical ROM =/> 55 degrees bilat ( 07/14/15)    Time 5   Period Weeks   Status Achieved   PT LONG TERM GOAL #3   Title report pain decrease =/> 50% ( 07/14/15)    Time 5   Period Weeks   Status Achieved   PT LONG TERM GOAL #4   Title improve FOTO =/< 32% limited ( 08/12/15)    Baseline 35% on 07/15/15   Time 5   Period Weeks   Status On-going   PT LONG TERM GOAL #5   Title tolerate lifting =/> 25# to allow return to work ( 08/12/15)    Time 5   Period Weeks   Status Partially Met  can lift 25# c/o discomfort through shds/upper back                Plan - 07/25/15 1333    Clinical Impression Statement Ariana Parks continues to progress with ROM; strength; functional activities. She is progressing well toward stated goals of therapy. Pt is concerned about return to work due to lifting requirement. She is progressing with work simulation tasks and did lift 25#  waist to hip height and return x5 reps with reports of discomfort across  her shouders and upper back.    Pt will benefit from skilled therapeutic intervention in order to improve on the following deficits Postural dysfunction;Decreased strength;Hypomobility;Pain;Impaired UE functional use;Decreased range of motion   PT Frequency 2x / week   PT Duration 4 weeks   PT Treatment/Interventions Traction;Ultrasound;Patient/family education;Passive range of motion;Cryotherapy;Electrical Stimulation;Moist Heat;Therapeutic exercise;Manual techniques;Dry needling   PT Next Visit Plan continue progressive postural strengthing/ stretching with simulated work exercises. continue with increased weight with simulated lifts as tolerated.    PT Home Exercise Plan postural correction; HEP   Consulted and Agree with Plan of Care Patient        Problem List Patient Active Problem List   Diagnosis Date Noted  . PCOS (polycystic ovarian syndrome) 05/31/2015  . Abnormal weight gain 03/10/2015  . Cervical strain 02/25/2015  . Abnormal EEG 01/02/2012  . ADD (attention deficit disorder) 01/02/2012  . Anxiety 01/02/2012  . Clinical depression 01/02/2012  . Current tobacco use 01/02/2012    Celyn Nilda Simmer PT, MPH  07/25/2015, 1:39 PM  [facility] [place], [place], [address] Phone: [phone]   Fax:  [phone]  Name: Loria Lacina MRN: [id number] Date of Birth: 04-03-1981

## 2015-07-26 ENCOUNTER — Ambulatory Visit: Payer: BLUE CROSS/BLUE SHIELD | Admitting: Family Medicine

## 2015-07-28 ENCOUNTER — Ambulatory Visit (INDEPENDENT_AMBULATORY_CARE_PROVIDER_SITE_OTHER): Payer: BLUE CROSS/BLUE SHIELD | Admitting: Family Medicine

## 2015-07-28 ENCOUNTER — Encounter: Payer: Self-pay | Admitting: Family Medicine

## 2015-07-28 VITALS — BP 134/74 | HR 77 | Wt 298.0 lb

## 2015-07-28 DIAGNOSIS — F988 Other specified behavioral and emotional disorders with onset usually occurring in childhood and adolescence: Secondary | ICD-10-CM

## 2015-07-28 DIAGNOSIS — F419 Anxiety disorder, unspecified: Secondary | ICD-10-CM

## 2015-07-28 DIAGNOSIS — F909 Attention-deficit hyperactivity disorder, unspecified type: Secondary | ICD-10-CM | POA: Diagnosis not present

## 2015-07-28 DIAGNOSIS — F329 Major depressive disorder, single episode, unspecified: Secondary | ICD-10-CM | POA: Diagnosis not present

## 2015-07-28 DIAGNOSIS — F32A Depression, unspecified: Secondary | ICD-10-CM

## 2015-07-28 NOTE — Assessment & Plan Note (Signed)
Worsening. Plan as below for clinical depression.

## 2015-07-28 NOTE — Assessment & Plan Note (Signed)
Will schedule testing. Follow up with psych.

## 2015-07-28 NOTE — Patient Instructions (Signed)
Thank you for coming in today. You were seen for your neck pain, anxiety and depression. Please start your medication this weekend, contact us if you have any problems. Please follow up with [place] in 1-2 weeks, before or after your psychiatry appointment.

## 2015-07-28 NOTE — Progress Notes (Signed)
Ariana Parks is a 35 y.o. female who presents to [facility] [place]: [facility] today for follow up neck pain, anxiety and depression.  Anxiety/Depression: Patient notes this is getting worse. Patient notes significant stress in her life with her grandfather found to have an abdominal mass that is unlikely to respond to surgical or medical intervention. She has not started her Effexor as she had a bradycardic episode to the 30s after starting a medication in the past, has history of abnormal EEG. Will start this weekend under the supervision of her mom. She has an appointment with psychiatry on 2/14, has not scheduled ADD testing as she was confused and thought it was another psychiatry appointment. Patient continues to have thoughts of self harm without a plan or desire to act on them. No HI.   Neck pain: Improving. Continues to go to physical therapy and gaining strength and ROM. Up to 20lbs with shoulder exercises, unable to do 25 lbs successfully. Grip strength improving. Will schedule more PT after this appt.    Past Medical History  Diagnosis Date  . GERD (gastroesophageal reflux disease)   . Panic attack   . High cholesterol   . Depression   . PCOS (polycystic ovarian syndrome)   . Abnormal Pap smear of cervix 2005    Mod dysplasia   Past Surgical History  Procedure Laterality Date  . Tonsillectomy    . Knee surgery    . Wisdom tooth extraction    . Leep     Social History  Substance Use Topics  . Smoking status: Current Every Day Smoker -- 0.50 packs/day for 15 years    Types: Cigarettes  . Smokeless tobacco: Never Used     Comment: using vapes  . Alcohol Use: 0.0 oz/week    0 Standard drinks or equivalent per week   family history includes GER disease in her father; Heart attack in her other; Hypertension in her mother; Migraines in her father; Thyroid disease in her brother and  mother.  ROS as above Medications: Current Outpatient Prescriptions  Medication Sig Dispense Refill  . Calcium Carbonate Antacid (ANTACID PO) Take by mouth.    . medroxyPROGESTERone (PROVERA) 10 MG tablet Use 1 pill for 10 days on day 16 of cycle 30 tablet 2  . medroxyPROGESTERone (PROVERA) 10 MG tablet Take 1 tablet (10 mg total) by mouth daily. Use for ten days 10 tablet 12  . methocarbamol (ROBAXIN) 500 MG tablet Take 500 mg by mouth 4 (four) times daily.    . naproxen sodium (ANAPROX) 220 MG tablet Take 220 mg by mouth.    . ranitidine (ZANTAC) 150 MG capsule Take 150 mg by mouth.    . venlafaxine (EFFEXOR) 37.5 MG tablet Take 1 tablet (37.5 mg total) by mouth 2 (two) times daily. (Patient not taking: Reported on 07/28/2015) 60 tablet 0   No current facility-administered medications for this visit.   Allergies  Allergen Reactions  . Celexa [Citalopram Hydrobromide]   . Ibuprofen     High doses  . Prednisone   . Prilosec [Omeprazole]   . Wellbutrin [Bupropion]   . Flexeril [Cyclobenzaprine] Other (See Comments)    Dreams     Exam:  BP 134/74 mmHg  Pulse 77  Wt 298 lb (135.172 kg)  LMP 07/21/2014 Gen: Well NAD HEENT: EOMI,  MMM Lungs: Normal work of breathing. CTABL Heart: RRR no MRG Abd: NABS, Soft. Nondistended, Nontender Exts: Brisk capillary refill, warm and well  perfused.   MSK: Full ROM in all planes Shrug and lateral rotation strength 5/5 bilaterally  Psych: Well dressed good hygeine Full range of affect, tearful at times when discussing stressors Mood: "not great" Poor eye contact, staring out the window most of the interview Not responding to internal stimuli, denies auditory and visual hallucinations Good insight and judgement Normal speech cadence and volume   GAD7: 19 PHQ9: 26   No results found for this or any previous visit (from the past 24 hour(s)). No results found.   Please see individual assessment and plan sections.

## 2015-07-28 NOTE — Assessment & Plan Note (Signed)
Worsening. Patient plans to start Effexor this weekend. Patient to go to psych on 2/14. Follow up in 1-2 weeks.

## 2015-08-01 ENCOUNTER — Encounter: Payer: BLUE CROSS/BLUE SHIELD | Admitting: Physical Therapy

## 2015-08-03 ENCOUNTER — Ambulatory Visit (INDEPENDENT_AMBULATORY_CARE_PROVIDER_SITE_OTHER): Payer: BLUE CROSS/BLUE SHIELD | Admitting: Physical Therapy

## 2015-08-03 DIAGNOSIS — R29898 Other symptoms and signs involving the musculoskeletal system: Secondary | ICD-10-CM

## 2015-08-03 DIAGNOSIS — M436 Torticollis: Secondary | ICD-10-CM

## 2015-08-03 DIAGNOSIS — R293 Abnormal posture: Secondary | ICD-10-CM | POA: Diagnosis not present

## 2015-08-03 NOTE — Therapy (Signed)
[place], [place], [address] Phone: [phone]   Fax:  [phone]  Physical Therapy Treatment  Patient Details  Name: Ariana Parks MRN: [id number] Date of Birth: Nov 26, 1980 Referring Provider: Dr. Georgina Snell  Encounter Date: 08/03/2015      PT End of Session - 08/03/15 1435    Visit Number 13   Number of Visits 17   Date for PT Re-Evaluation 08/15/15   PT Start Time 1430   PT Stop Time 1520   PT Time Calculation (min) 50 min   Activity Tolerance Patient tolerated treatment well      Past Medical History  Diagnosis Date  . GERD (gastroesophageal reflux disease)   . Panic attack   . High cholesterol   . Depression   . PCOS (polycystic ovarian syndrome)   . Abnormal Pap smear of cervix 2005    Mod dysplasia    Past Surgical History  Procedure Laterality Date  . Tonsillectomy    . Knee surgery    . Wisdom tooth extraction    . Leep      There were no vitals filed for this visit.  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  Continues to perform HEP on own.    Patient Stated Goals look for traffic when switching lanes. get lifting restrictions raised to at least 25# so she can return to work.    Currently in Pain? No/denies            [facility] PT Assessment - 08/03/15 0001    Assessment   Medical Diagnosis Cervical Strain   Referring Provider Dr. Georgina Snell   Onset Date/Surgical Date 01/04/15   Hand Dominance Left   Next MD Visit 08/11/15           [facility] Adult PT Treatment/Exercise - 08/03/15 0001    Elbow Exercises   Elbow Flexion Right;Left;20 reps;Theraband  green band   Neck Exercises: Machines for Strengthening   UBE (Upper Arm Bike) L5: alternating directions for 4 total minutes.    Shoulder  Exercises: Standing   Extension Both;10 reps;Theraband   Theraband Level (Shoulder Extension) Level 3 (Green)   Extension Limitations tricep kick back with green x 10   Row Both;10 reps;Theraband   Theraband Level (Shoulder Row) Level 4 (Blue)   Other Standing Exercises Rings on hoop at highest level (12) to build endurance in neck/shoulder - each side Lt to/from Rt.; simulated bag throw with 24.5 # x 2 reps (1st with Lt, 2nd rep with both arms)    Other Standing Exercises lifting 25 pounds - moving from waist to hip height and back x 2    Shoulder Exercises: Parks   Cross Chest Parks 2 reps;30 seconds   Other Shoulder Stretches tricep Parks 30 sec x 2 reps each arm.    Cryotherapy   Number Minutes Cryotherapy 12 Minutes   Cryotherapy Location Cervical;Shoulder   Type of Cryotherapy Ice pack   Neck Exercises: Stretches   Upper Trapezius Parks 30 seconds;4 reps  each side, 2 sets   Levator Parks 30 seconds;2 reps  each side, 2 sets   Corner Parks Limitations 3 way doorway 30 sec x2 each position; biceps Parks 20 sec x 2   2 sets     Tricep  Parks x 30 each arm,. And cross chest Parks x 30 sec.            PT Education - 08/03/15 1516    Education provided Yes   Education Details HEP    Person(s) Educated Patient   Methods Explanation;Handout   Comprehension Returned demonstration;Verbalized understanding             PT Long Term Goals - 07/25/15 1336    PT LONG TERM GOAL #1   Title I with HEP ( 08/12/15)    Time 5   Period Weeks   Status On-going   PT LONG TERM GOAL #2   Title increase cervical ROM =/> 55 degrees bilat ( 07/14/15)    Time 5   Period Weeks   Status Achieved   PT LONG TERM GOAL #3   Title report pain decrease =/> 50% ( 07/14/15)    Time 5   Period Weeks   Status Achieved   PT LONG TERM GOAL #4   Title improve FOTO =/< 32% limited ( 08/12/15)    Baseline 35% on 07/15/15   Time 5   Period Weeks   Status On-going   PT LONG  TERM GOAL #5   Title tolerate lifting =/> 25# to allow return to work ( 08/12/15)    Time 5   Period Weeks   Status Partially Met  can lift 25# c/o discomfort through shds/upper back                Plan - 08/03/15 1515    Clinical Impression Statement Pt tolerated all exercises today well, some with increased resistance. No production of pain, just fatigue.  Lift of 24.5# was difficult, per pt; improved comfort when using 2 hands.  Pt is progressing towards remaining goals.    Pt will benefit from skilled therapeutic intervention in order to improve on the following deficits Postural dysfunction;Decreased strength;Hypomobility;Pain;Impaired UE functional use;Decreased range of motion   Rehab Potential Good   PT Frequency 2x / week   PT Duration 4 weeks   PT Treatment/Interventions Traction;Ultrasound;Patient/family education;Passive range of motion;Cryotherapy;Electrical Stimulation;Moist Heat;Therapeutic exercise;Manual techniques;Dry needling   PT Next Visit Plan continue progressive postural strengthing/ stretching with simulated work exercises. continue with increased weight with simulated lifts as tolerated.    Consulted and Agree with Plan of Care Patient        Problem List Patient Active Problem List   Diagnosis Date Noted  . PCOS (polycystic ovarian syndrome) 05/31/2015  . Abnormal weight gain 03/10/2015  . Cervical strain 02/25/2015  . Abnormal EEG 01/02/2012  . ADD (attention deficit disorder) 01/02/2012  . Anxiety 01/02/2012  . Clinical depression 01/02/2012  . Current tobacco use 01/02/2012   Kerin Perna, PTA 08/03/2015 3:17 PM  [facility] [place], [place], [address] Phone: [phone]   Fax:  [phone]  Name: Ariana Parks MRN: [id number] Date of Birth: [birth year]

## 2015-08-03 NOTE — Patient Instructions (Signed)
Kick-Back: Single Arm    Face anchor in wide stride stance. Support trunk with free hand on knee. Thumb up, extend arm backward. Repeat _10_ times per set. Repeat with other arm. Do _1-2_ sets per session. Do _3_ sessions per week. Anchor Height: Ankle  [email or web address]   Curl: Palm Up (Single Arm)    Anchor tubing under back foot in stride stance. Palm up, curl arm toward shoulder. Repeat _10_ times per set. Repeat with other arm. Do _1-2_ sets per session. Do _3_ sessions per week.  [email or web address]   [facility] [place], [place] [address]  [phone] (office) [phone] (fax)

## 2015-08-05 ENCOUNTER — Encounter: Payer: Self-pay | Admitting: Rehabilitative and Restorative Service Providers"

## 2015-08-05 ENCOUNTER — Ambulatory Visit (INDEPENDENT_AMBULATORY_CARE_PROVIDER_SITE_OTHER): Payer: BLUE CROSS/BLUE SHIELD | Admitting: Rehabilitative and Restorative Service Providers"

## 2015-08-05 DIAGNOSIS — M436 Torticollis: Secondary | ICD-10-CM

## 2015-08-05 DIAGNOSIS — M25512 Pain in left shoulder: Secondary | ICD-10-CM

## 2015-08-05 DIAGNOSIS — M542 Cervicalgia: Secondary | ICD-10-CM

## 2015-08-05 DIAGNOSIS — R29898 Other symptoms and signs involving the musculoskeletal system: Secondary | ICD-10-CM

## 2015-08-05 DIAGNOSIS — R293 Abnormal posture: Secondary | ICD-10-CM

## 2015-08-05 NOTE — Therapy (Signed)
[place], [place], [address] Phone: [phone]   Fax:  [phone]  Physical Therapy Treatment  Patient Details  Name: Ariana Parks MRN: [id number] Date of Birth: [birth year] Referring Provider: Dr. Georgina Snell  Encounter Date: 08/05/2015      PT End of Session - 08/05/15 1456    Visit Number 14   Number of Visits 17   Date for PT Re-Evaluation 08/15/15   PT Start Time 2751   PT Stop Time 1542   PT Time Calculation (min) 49 min   Activity Tolerance Patient tolerated treatment well      Past Medical History  Diagnosis Date  . GERD (gastroesophageal reflux disease)   . Panic attack   . High cholesterol   . Depression   . PCOS (polycystic ovarian syndrome)   . Abnormal Pap smear of cervix 2005    Mod dysplasia    Past Surgical History  Procedure Laterality Date  . Tonsillectomy    . Knee surgery    . Wisdom tooth extraction    . Leep      There were no vitals filed for this visit.  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  Continues to perform HEP on own.    Currently in Pain? No/denies                         [facility] Adult PT Treatment/Exercise - 08/05/15 0001    Elbow Exercises   Elbow Flexion Right;Left;20 reps;Theraband  blue band   Neck Exercises: Machines for Strengthening   UBE (Upper Arm Bike) L5: alternating directions for 4 total minutes.    Shoulder Exercises: Standing   Extension Both;10 reps;Theraband   Theraband Level (Shoulder Extension) Level 4 (Blue)   Extension Limitations tricep kick back with green    Row Both;10 reps;Theraband   Theraband Level (Shoulder Row) Level 4 (Blue)   Other Standing Exercises Rings on hoop at  highest level (12) to build endurance in neck/shoulder - each side Lt to/from Rt.; simulated bag throw with 24.5 # x 2 reps (1st with Lt, 2nd rep with both arms)    Other Standing Exercises lifting 25 pounds - moving from waist to hip height and back x 5    Shoulder Exercises: Stretch   Cross Chest Stretch 2 reps;30 seconds   Other Shoulder Stretches tricep stretch 30 sec x 2 reps each arm.    Cryotherapy   Number Minutes Cryotherapy 14 Minutes   Cryotherapy Location Cervical;Shoulder   Type of Cryotherapy Ice pack   Neck Exercises: Stretches   Upper Trapezius Stretch 30 seconds;4 reps  each side, 2 sets   Levator Stretch 30 seconds;2 reps  each side, 2 sets   Corner Stretch Limitations 3 way doorway 30 sec x2 each position; biceps stretch 20 sec x 2   2 sets                     PT Long Term Goals - 07/25/15 1336    PT LONG TERM GOAL #1   Title I with HEP ( 08/12/15)    Time 5   Period Weeks   Status On-going   PT  LONG TERM GOAL #2   Title increase cervical ROM =/> 55 degrees bilat ( 07/14/15)    Time 5   Period Weeks   Status Achieved   PT LONG TERM GOAL #3   Title report pain decrease =/> 50% ( 07/14/15)    Time 5   Period Weeks   Status Achieved   PT LONG TERM GOAL #4   Title improve FOTO =/< 32% limited ( 08/12/15)    Baseline 35% on 07/15/15   Time 5   Period Weeks   Status On-going   PT LONG TERM GOAL #5   Title tolerate lifting =/> 25# to allow return to work ( 08/12/15)    Time 5   Period Weeks   Status Partially Met  can lift 25# c/o discomfort through shds/upper back                Plan - 08/05/15 1456    Clinical Impression Statement Pt continues to tolerate exercise and work simulation tasks without significant difficulty. Progressing toward stated goals of therapy.    Pt will benefit from skilled therapeutic intervention in order to improve on the following deficits Postural dysfunction;Decreased strength;Hypomobility;Pain;Impaired UE  functional use;Decreased range of motion   Rehab Potential Good   PT Frequency 2x / week   PT Duration 4 weeks   PT Treatment/Interventions Traction;Ultrasound;Patient/family education;Passive range of motion;Cryotherapy;Electrical Stimulation;Moist Heat;Therapeutic exercise;Manual techniques;Dry needling   PT Next Visit Plan continue progressive postural strengthing/ stretching with simulated work exercises. continue with increased weight with simulated lifts as tolerated.    PT Home Exercise Plan postural correction; HEP   Consulted and Agree with Plan of Care Patient        Problem List Patient Active Problem List   Diagnosis Date Noted  . PCOS (polycystic ovarian syndrome) 05/31/2015  . Abnormal weight gain 03/10/2015  . Cervical strain 02/25/2015  . Abnormal EEG 01/02/2012  . ADD (attention deficit disorder) 01/02/2012  . Anxiety 01/02/2012  . Clinical depression 01/02/2012  . Current tobacco use 01/02/2012    Labrisha Wuellner Nilda Simmer PT,MPH  08/05/2015, 3:31 PM  [facility] [place], [place], [address] Phone: [phone]   Fax:  [phone]  Name: Ariana Parks MRN: [id number] Date of Birth: [birth year]

## 2015-08-08 ENCOUNTER — Encounter: Payer: BLUE CROSS/BLUE SHIELD | Admitting: Physical Therapy

## 2015-08-09 ENCOUNTER — Ambulatory Visit (HOSPITAL_COMMUNITY): Payer: BLUE CROSS/BLUE SHIELD | Admitting: Psychiatry

## 2015-08-10 ENCOUNTER — Ambulatory Visit: Payer: BLUE CROSS/BLUE SHIELD | Admitting: Psychology

## 2015-08-10 ENCOUNTER — Encounter (HOSPITAL_COMMUNITY): Payer: Self-pay | Admitting: *Deleted

## 2015-08-10 ENCOUNTER — Encounter: Payer: BLUE CROSS/BLUE SHIELD | Admitting: Physical Therapy

## 2015-08-11 ENCOUNTER — Ambulatory Visit: Payer: BLUE CROSS/BLUE SHIELD | Admitting: Family Medicine

## 2015-08-18 ENCOUNTER — Ambulatory Visit (INDEPENDENT_AMBULATORY_CARE_PROVIDER_SITE_OTHER): Payer: BLUE CROSS/BLUE SHIELD | Admitting: Family Medicine

## 2015-08-18 DIAGNOSIS — F329 Major depressive disorder, single episode, unspecified: Secondary | ICD-10-CM

## 2015-08-18 NOTE — Progress Notes (Signed)
   Subjective:    Patient ID: Ariana Parks, female    DOB: 1980/10/21, 35 y.o.   MRN: [id number] Called pt to make sure everything was okay since she no-showed her depression f/u today.  She gasped loudly & stated that she completely forgot & she was very apologetic.  I transferred her to scheduling to make another appt.  Beatris Ship, CMA  HPI    Review of Systems     Objective:   Physical Exam        Assessment & Plan:

## 2015-08-18 NOTE — Progress Notes (Signed)
No show

## 2015-08-19 ENCOUNTER — Encounter: Payer: Self-pay | Admitting: Family Medicine

## 2015-08-19 ENCOUNTER — Ambulatory Visit (INDEPENDENT_AMBULATORY_CARE_PROVIDER_SITE_OTHER): Payer: BLUE CROSS/BLUE SHIELD | Admitting: Family Medicine

## 2015-08-19 VITALS — BP 123/72 | HR 72 | Wt 296.0 lb

## 2015-08-19 DIAGNOSIS — F32A Depression, unspecified: Secondary | ICD-10-CM

## 2015-08-19 DIAGNOSIS — F329 Major depressive disorder, single episode, unspecified: Secondary | ICD-10-CM | POA: Diagnosis not present

## 2015-08-19 DIAGNOSIS — S161XXA Strain of muscle, fascia and tendon at neck level, initial encounter: Secondary | ICD-10-CM | POA: Diagnosis not present

## 2015-08-19 MED ORDER — VILAZODONE HCL 10 & 20 MG PO KIT: 1.0000 | PACK | Freq: Every day | ORAL | Status: DC

## 2015-08-19 NOTE — Progress Notes (Signed)
Ariana Parks is a 35 y.o. female who presents to [place]: Primary Care today for follow-up of depression and neck pain.  1) depression: Patient was started on Effexor at the last visit. She notes her depression symptoms have worsened a bit since her last visit. Her grandfather who is significantly ill a month ago died about a week ago. She notes this is hit her very hard. She notes she is having trouble tolerating the Effexor. It causes difficulty sleeping. She denies any SI or HI.  2) neck: Improvement with physical therapy. Patient has not yet ready to return to work. She is optimistic about return sooner.     Past Medical History  Diagnosis Date  . GERD (gastroesophageal reflux disease)   . Panic attack   . High cholesterol   . Depression   . PCOS (polycystic ovarian syndrome)   . Abnormal Pap smear of cervix 2005    Mod dysplasia   Past Surgical History  Procedure Laterality Date  . Tonsillectomy    . Knee surgery    . Wisdom tooth extraction    . Leep     Social History  Substance Use Topics  . Smoking status: Current Every Day Smoker -- 0.50 packs/day for 15 years    Types: Cigarettes  . Smokeless tobacco: Never Used     Comment: using vapes  . Alcohol Use: 0.0 oz/week    0 Standard drinks or equivalent per week   family history includes GER disease in her father; Heart attack in her other; Hypertension in her mother; Migraines in her father; Thyroid disease in her brother and mother.  ROS as above Medications: Current Outpatient Prescriptions  Medication Sig Dispense Refill  . Calcium Carbonate Antacid (ANTACID PO) Take by mouth.    . medroxyPROGESTERone (PROVERA) 10 MG tablet Use 1 pill for 10 days on day 16 of cycle 30 tablet 2  . medroxyPROGESTERone (PROVERA) 10 MG tablet Take 1 tablet (10 mg total) by mouth daily. Use for ten days 10 tablet 12  . methocarbamol  (ROBAXIN) 500 MG tablet Take 500 mg by mouth 4 (four) times daily.    . naproxen sodium (ANAPROX) 220 MG tablet Take 220 mg by mouth.    . ranitidine (ZANTAC) 150 MG capsule Take 150 mg by mouth.    . venlafaxine (EFFEXOR) 37.5 MG tablet Take 1 tablet (37.5 mg total) by mouth 2 (two) times daily. 60 tablet 0  . Vilazodone HCl (VIIBRYD STARTER PACK) 10 & 20 MG KIT Take 1 tablet by mouth daily. Use as directed, 4 weeks QS 1 kit 0   No current facility-administered medications for this visit.   Allergies  Allergen Reactions  . Celexa [Citalopram Hydrobromide]   . Ibuprofen     High doses  . Prednisone   . Prilosec [Omeprazole]   . Wellbutrin [Bupropion]   . Flexeril [Cyclobenzaprine] Other (See Comments)    Dreams     Exam:  BP 123/72 mmHg  Pulse 72  Wt 296 lb (134.265 kg) Gen: Well NAD HEENT: EOMI,  MMM Lungs: Normal work of breathing. CTABL Heart: RRR no MRG Abd: NABS, Soft. Nondistended, Nontender Exts: Brisk capillary refill, warm and well perfused.  Psych: Alert and oriented normal speech thought process and affect. No SI or HI expressed. PHQ9 is 26 GAD 7 is 21  No results found for this or any previous visit (from the past 24 hour(s)). No results found.   Please see  individual assessment and plan sections.

## 2015-08-19 NOTE — Assessment & Plan Note (Signed)
Complicated by acute grief. Stop Effexor. Start Viibryd. Recheck in 1-2 weeks. Consider grief counseling. Discuss suicidal and homicidal precautions.

## 2015-08-19 NOTE — Assessment & Plan Note (Signed)
Continue physical therapy. Work note provided. Return in one to 2 weeks.

## 2015-08-19 NOTE — Patient Instructions (Signed)
Thank you for coming in today. Stop Effexor.  Start Viibryd.  Return in 1-2 weeks. ] Return sooner if worse. Vilazodone oral tablet What is this medicine? VILAZODONE (vil [place] oh done) is used to treat depression. This medicine may be used for other purposes; ask your health care provider or pharmacist if you have questions. What should I tell my health care provider before I take this medicine? They need to know if you have any of these conditions: -bipolar disorder or a family history of bipolar disorder -glaucoma -liver disease -low levels of sodium in the blood -receiving electroconvulsive therapy -seizures (convulsions) -suicidal thoughts, plans, or attempt by you or a family member -an unusual or allergic reaction to vilazodone, other medicines, foods, dyes or preservatives -pregnant or trying to get pregnant -breast-feeding How should I use this medicine? Take this medicine by mouth with a glass of water. Follow the directions on the prescription label. Take this medicine with food. Take your medicine at regular intervals. Do not take your medicine more often than directed. Do not stop taking this medicine suddenly except upon the advice of your doctor. Stopping this medicine too quickly may cause serious side effects or your condition may worsen. A special MedGuide will be given to you by the pharmacist with each prescription and refill. Be sure to read this information carefully each time. Overdosage: If you think you have taken too much of this medicine contact a poison control center or emergency room at once. NOTE: This medicine is only for you. Do not share this medicine with others. What if I miss a dose? If you miss a dose, take it as soon as you can. If it is almost time for your next dose, take only that dose. Do not take double or extra doses. What may interact with this medicine? Do not take this medicine with any of the following medications: -linezolid -MAOIs like  Carbex, Eldepryl, Marplan, Nardil, and Parnate -methylene blue (injected into a vein) St. John's wort This medicine may also interact with the following medications: -aspirin and aspirin-like medicines -buspirone -certain diet drugs like dexfenfluramine, fenfluramine, phentermine, sibutramine -certain migraine headache medicines like almotriptan, eletriptan, frovatriptan, naratriptan, rizatriptan, sumatriptan, zolmitriptan -certain medicines that treat or prevent blood clots like warfarin, enoxaparin, and dalteparin -erythromycin -ketoconazole -NSAIDS, medicines for pain and inflammation, like ibuprofen or naproxen -other medicines for depression, anxiety, or psychotic disturbances -ritonavir -tramadol -tryptophan This list may not describe all possible interactions. Give your health care provider a list of all the medicines, herbs, non-prescription drugs, or dietary supplements you use. Also tell them if you smoke, drink alcohol, or use illegal drugs. Some items may interact with your medicine. What should I watch for while using this medicine? Tell your doctor if your symptoms do not get better or if they get worse. Visit your doctor or health care professional for regular checks on your progress. Because it may take several weeks to see the full effects of this medicine, it is important to continue your treatment as prescribed by your doctor. Patients and their families should watch out for new or worsening thoughts of suicide or depression. Also watch out for sudden changes in feelings such as feeling anxious, agitated, panicky, irritable, hostile, aggressive, impulsive, severely restless, overly excited and hyperactive, or not being able to sleep. If this happens, especially at the beginning of treatment or after a change in dose, call your health care professional. Dennis Bast may get drowsy or dizzy. Do not drive, use machinery, or  do anything that needs mental alertness until you know how this  medicine affects you. Do not stand or sit up quickly, especially if you are an older patient. This reduces the risk of dizzy or fainting spells. Alcohol may interfere with the effect of this medicine. Avoid alcoholic drinks. Your mouth may get dry. Chewing sugarless gum or sucking hard candy, and drinking plenty of water may help. Contact your doctor if the problem does not go away or is severe. What side effects may I notice from receiving this medicine? Side effects that you should report to your doctor or health care professional as soon as possible: -allergic reactions like skin rash, itching or hives, swelling of the face, lips, or tongue -fast, irregular heartbeat -seizures -suicidal thoughts or other mood changes -unusual bleeding or bruising -vomiting Side effects that usually do not require medical attention (Report these to your doctor or health care professional if they continue or are bothersome.): -change in sex drive or performance -diarrhea -drowsiness -dizziness -dry mouth -increased sweating -insomnia -nausea -tremors This list may not describe all possible side effects. Call your doctor for medical advice about side effects. You may report side effects to FDA at 1-800-FDA-1088. Where should I keep my medicine? Keep out of the reach of children. Store at room temperature between 15 and 30 degrees C (59 to 86 degrees F). Throw away any unused medicine after the expiration date. NOTE: This sheet is a summary. It may not cover all possible information. If you have questions about this medicine, talk to your doctor, pharmacist, or health care provider.    2016, Elsevier/Gold Standard. (2013-01-02 13:23:54)

## 2015-09-01 ENCOUNTER — Ambulatory Visit (INDEPENDENT_AMBULATORY_CARE_PROVIDER_SITE_OTHER): Payer: BLUE CROSS/BLUE SHIELD | Admitting: Physical Therapy

## 2015-09-01 ENCOUNTER — Ambulatory Visit (INDEPENDENT_AMBULATORY_CARE_PROVIDER_SITE_OTHER): Payer: BLUE CROSS/BLUE SHIELD | Admitting: Family Medicine

## 2015-09-01 ENCOUNTER — Encounter: Payer: Self-pay | Admitting: Family Medicine

## 2015-09-01 VITALS — BP 139/75 | HR 86 | Wt 301.0 lb

## 2015-09-01 DIAGNOSIS — F329 Major depressive disorder, single episode, unspecified: Secondary | ICD-10-CM

## 2015-09-01 DIAGNOSIS — R293 Abnormal posture: Secondary | ICD-10-CM

## 2015-09-01 DIAGNOSIS — M436 Torticollis: Secondary | ICD-10-CM | POA: Diagnosis not present

## 2015-09-01 DIAGNOSIS — M25512 Pain in left shoulder: Secondary | ICD-10-CM

## 2015-09-01 DIAGNOSIS — R29898 Other symptoms and signs involving the musculoskeletal system: Secondary | ICD-10-CM | POA: Diagnosis not present

## 2015-09-01 DIAGNOSIS — M542 Cervicalgia: Secondary | ICD-10-CM | POA: Diagnosis not present

## 2015-09-01 DIAGNOSIS — F419 Anxiety disorder, unspecified: Secondary | ICD-10-CM | POA: Diagnosis not present

## 2015-09-01 DIAGNOSIS — F32A Depression, unspecified: Secondary | ICD-10-CM

## 2015-09-01 NOTE — Progress Notes (Signed)
       Ariana Parks is a 35 y.o. female who presents to [facility]: [facility] today for follow-up depression and anxiety. Patient has been seen several times for depression and anxiety exacerbated by the recent death of her grandfather. She was started on Effexor and had difficulty tolerating it. She was switched to Artemus but due to a variety of circumstances picked it up yesterday and has not taken it yet. She has stopped taking the Effexor. She denies any SI or HI. Patient notes that she has attempted multiple times to schedule with counseling and psychiatry that was she was referred to. She has not been contacted back.   Past Medical History  Diagnosis Date  . GERD (gastroesophageal reflux disease)   . Panic attack   . High cholesterol   . Depression   . PCOS (polycystic ovarian syndrome)   . Abnormal Pap smear of cervix 2005    Mod dysplasia   Past Surgical History  Procedure Laterality Date  . Tonsillectomy    . Knee surgery    . Wisdom tooth extraction    . Leep     Social History  Substance Use Topics  . Smoking status: Current Every Day Smoker -- 0.50 packs/day for 15 years    Types: Cigarettes  . Smokeless tobacco: Never Used     Comment: using vapes  . Alcohol Use: 0.0 oz/week    0 Standard drinks or equivalent per week   family history includes GER disease in her father; Heart attack in her other; Hypertension in her mother; Migraines in her father; Thyroid disease in her brother and mother.  ROS as above Medications: Current Outpatient Prescriptions  Medication Sig Dispense Refill  . Calcium Carbonate Antacid (ANTACID PO) Take by mouth.    . medroxyPROGESTERone (PROVERA) 10 MG tablet Take 1 tablet (10 mg total) by mouth daily. Use for ten days 10 tablet 12  . methocarbamol (ROBAXIN) 500 MG tablet Take 500 mg by mouth 4 (four) times daily.    . naproxen sodium (ANAPROX)  220 MG tablet Take 220 mg by mouth.    . ranitidine (ZANTAC) 150 MG capsule Take 150 mg by mouth.    . Vilazodone HCl (VIIBRYD STARTER PACK) 10 & 20 MG KIT Take 1 tablet by mouth daily. Use as directed, 4 weeks QS 1 kit 0   No current facility-administered medications for this visit.   Allergies  Allergen Reactions  . Celexa [Citalopram Hydrobromide]   . Ibuprofen     High doses  . Prednisone   . Prilosec [Omeprazole]   . Wellbutrin [Bupropion]   . Flexeril [Cyclobenzaprine] Other (See Comments)    Dreams     Exam:  BP 139/75 mmHg  Pulse 86  Wt 301 lb (136.533 kg) Gen: Well NAD Psych: Alert and oriented normal speech thought process and affect. Tearful at times. No SI or HI expressed.  PHQ9 is 26 GAD 7 is 21  No results found for this or any previous visit (from the past 24 hour(s)). No results found.   35 year old woman with depression and anxiety. Take Viibryd. Refer to LCSW and psychiatry.  Return in 2 weeks.

## 2015-09-01 NOTE — Patient Instructions (Signed)
Thank you for coming in today. Return in 2 week.  We will try to get you into psychology and psychiatry.  Dr Sheppard Coil or myself are accepting new patients.  I do not specialize in geriatrics.

## 2015-09-01 NOTE — Therapy (Addendum)
[facility] [place], [place], [address] Phone: [phone]   Fax:  [phone]  Physical Therapy Treatment  Patient Details  Name: Ariana Parks MRN: [id number] Date of Birth: 05-10-1981 Referring Provider: Dr. Georgina Snell  Encounter Date: 09/01/2015      PT End of Session - 09/01/15 1623    Visit Number 15   Number of Visits 23   Date for PT Re-Evaluation 09/29/15   PT Start Time 1620   PT Stop Time 1713   PT Time Calculation (min) 53 min   Activity Tolerance Patient tolerated treatment well;No increased pain      Past Medical History  Diagnosis Date  . GERD (gastroesophageal reflux disease)   . Panic attack   . High cholesterol   . Depression   . PCOS (polycystic ovarian syndrome)   . Abnormal Pap smear of cervix 2005    Mod dysplasia    Past Surgical History  Procedure Laterality Date  . Tonsillectomy    . Knee surgery    . Wisdom tooth extraction    . Leep      There were no vitals filed for this visit.  Visit Diagnosis:  Stiffness of neck  Abnormal posture  Weakness of shoulder  Pain, neck  Pain in shoulder region, left      Subjective Assessment - 09/01/15 1624    Subjective Pt reports she has been taking care of her family.  Pt's grandfather passed and she will now have to care for her grandmother in 2 wks when she finishes stay in SNF.  Pt very tearful throughout session. Pt interested in continuation of therapy in hopes to return to work.    Currently in Pain? Yes   Pain Score 3    Pain Location Neck   Pain Orientation Right   Pain Descriptors / Indicators Aching  stiff    Aggravating Factors  bending, sleeping wrong way.    Pain Relieving Factors rest, stretches.             [facility] PT Assessment - 09/01/15 0001    Assessment   Medical Diagnosis Cervical Strain   Referring Provider Dr. Georgina Snell   Onset Date/Surgical Date 01/04/15   Hand Dominance Left   Next MD  Visit 09/15/15   Observation/Other Assessments   Focus on Therapeutic Outcomes (FOTO)  37% limited   AROM   Cervical Flexion 50   Cervical Extension 48   Cervical - Right Side Bend 40   Cervical - Left Side Bend 50   Cervical - Right Rotation 65   Cervical - Left Rotation 60          [facility] Adult PT Treatment/Exercise - 09/01/15 0001    Neck Exercises: Machines for Strengthening   UBE (Upper Arm Bike) L3: alternating directions for 4 min    Neck Exercises: Seated   Other Seated Exercise lateral side bend stretch 30 sec each side, x 2 reps each    Neck Exercises: Supine   Cervical Rotation 5 reps;Right;Left   Shoulder Exercises: Supine   Horizontal ABduction Strengthening;Both;10 reps;Theraband  2 sets   Theraband Level (Shoulder Horizontal ABduction) Level 2 (Red)   External Rotation Strengthening;Both;10 reps;Theraband  2 sets   Theraband Level (Shoulder External Rotation) Level 2 (Red)   Flexion Both;10 reps  overhead pull, 2 sets   Other Supine Exercises shoulder diagonals x 10 reps each shoulder with red band.   2 sets   Shoulder Exercises: Stretch  Other Shoulder Stretches tricep stretch 30 sec x 2 reps each arm.    Other Shoulder Stretches Doorway stretch 3 positions x 30 sec x 2 reps each position   Cryotherapy   Number Minutes Cryotherapy 12 Minutes   Cryotherapy Location Cervical;Shoulder   Type of Cryotherapy Ice pack                PT Education - 09/01/15 1651    Education provided Yes   Education Details Pt encouraged to return to existing HEP with red band until can tolerate increased resistance.   Person(s) Educated Patient   Methods Explanation   Comprehension Verbalized understanding             PT Long Term Goals - 09/01/15 1722    PT LONG TERM GOAL #1   Title I with HEP ( 09/29/15)    Time 4   Period Weeks   Status On-going   PT LONG TERM GOAL #2   Title increase cervical ROM =/> 55 degrees bilat ( 07/14/15)    Status Achieved    PT LONG TERM GOAL #3   Title report pain decrease =/> 50% ( 09/29/15)    Time 4   Period Weeks   Status Partially Met  pt had met this goal, has been out of therapy for a while and the pain has flared up   PT LONG TERM GOAL #4   Title improve FOTO =/< 32% limited ( 09/29/15)    Time 4   Period Weeks   Status On-going  scored 37% limited   PT LONG TERM GOAL #5   Title tolerate lifting =/> 25# to allow return to work ( 09/29/15)    Time 4   Period Weeks   Status Partially Met               Plan - 09/01/15 1653    Clinical Impression Statement Pt demonstrated decreased neck ROM and decreased activity tolerance this visit compared to last visit.  Pt has had a recent flare since dealing with family stress. The family issues limited her ability to attend PT sessions.  Pt remains interested in continuation of therapy to maximize functional independence and decrease pain.    Pt will benefit from skilled therapeutic intervention in order to improve on the following deficits Postural dysfunction;Decreased strength;Hypomobility;Pain;Impaired UE functional use;Decreased range of motion   Rehab Potential Good   PT Frequency 2x / week   PT Duration 4 weeks   PT Treatment/Interventions Traction;Ultrasound;Patient/family education;Passive range of motion;Cryotherapy;Electrical Stimulation;Moist Heat;Therapeutic exercise;Manual techniques;Dry needling   PT Next Visit Plan Spoke to supervising PT regarding pt's progress and desire to return to therapy.  Will continue progressive postural and shoulder/neck strengthening/ stretching    Consulted and Agree with Plan of Care Patient        Problem List Patient Active Problem List   Diagnosis Date Noted  . PCOS (polycystic ovarian syndrome) 05/31/2015  . Abnormal weight gain 03/10/2015  . Cervical strain 02/25/2015  . Abnormal EEG 01/02/2012  . ADD (attention deficit disorder) 01/02/2012  . Anxiety 01/02/2012  . Clinical depression 01/02/2012   . Current tobacco use 01/02/2012    Kerin Perna, PTA 09/01/2015 5:30 PM   Jeral Pinch, PT 09/01/2015 5:31 PM   [facility], [place], [address] Phone: [phone]   Fax:  [phone]  Name: Ariana Parks MRN: [id number] Date of Birth: Jan 25, 1981   PHYSICAL THERAPY DISCHARGE SUMMARY  Visits  from Start of Care: 15  Current functional level related to goals / functional outcomes: Gradual improvement in cervical dysfunction. Patient demonstrated gains in ROM; strength; functional abilities and reported decrease in pain.   Remaining deficits: Intermittent symptoms    Education / Equipment: HEP Plan: Patient agrees to discharge.  Patient goals were partially met. Patient is being discharged due to being pleased with the current functional level.  ?????    Celyn P. Helene Kelp PT, MPH 12/01/2015 11:46 AM

## 2015-09-02 ENCOUNTER — Ambulatory Visit: Payer: BLUE CROSS/BLUE SHIELD | Admitting: Family Medicine

## 2015-09-05 ENCOUNTER — Encounter: Payer: BLUE CROSS/BLUE SHIELD | Admitting: Rehabilitative and Restorative Service Providers"

## 2015-09-07 ENCOUNTER — Encounter: Payer: BLUE CROSS/BLUE SHIELD | Admitting: Physical Therapy

## 2015-09-15 ENCOUNTER — Ambulatory Visit (INDEPENDENT_AMBULATORY_CARE_PROVIDER_SITE_OTHER): Payer: BLUE CROSS/BLUE SHIELD | Admitting: Family Medicine

## 2015-09-15 ENCOUNTER — Encounter: Payer: Self-pay | Admitting: Family Medicine

## 2015-09-15 VITALS — BP 145/82 | HR 73 | Wt 305.0 lb

## 2015-09-15 DIAGNOSIS — F329 Major depressive disorder, single episode, unspecified: Secondary | ICD-10-CM | POA: Diagnosis not present

## 2015-09-15 DIAGNOSIS — F419 Anxiety disorder, unspecified: Secondary | ICD-10-CM

## 2015-09-15 DIAGNOSIS — F32A Depression, unspecified: Secondary | ICD-10-CM

## 2015-09-15 MED ORDER — VILAZODONE HCL 10 MG PO TABS: 10.0000 mg | ORAL_TABLET | Freq: Every day | ORAL | Status: DC

## 2015-09-15 NOTE — Patient Instructions (Signed)
Thank you for coming in today. Take viibryd 10mg  daily.  Return in 2-4 weeks.

## 2015-09-15 NOTE — Progress Notes (Signed)
       Ariana Parks is a 34 y.o. female who presents to [place]: [facility] today for follow-up anxiety. Patient was seen about 2 weeks ago for continued anxiety. She was started on fibroid. She did well with fiber at 10 mg but had worsening nausea with Viibryd 20 mg. The nausea coincided with amoxicillin that she was taking for tooth pain. She would like to just take 10 mg of possible she thinks this helped a lot.  She has not yet heard from psychology referral.   Past Medical History  Diagnosis Date  . GERD (gastroesophageal reflux disease)   . Panic attack   . High cholesterol   . Depression   . PCOS (polycystic ovarian syndrome)   . Abnormal Pap smear of cervix 2005    Mod dysplasia   Past Surgical History  Procedure Laterality Date  . Tonsillectomy    . Knee surgery    . Wisdom tooth extraction    . Leep     Social History  Substance Use Topics  . Smoking status: Current Every Day Smoker -- 0.50 packs/day for 15 years    Types: Cigarettes  . Smokeless tobacco: Never Used     Comment: using vapes  . Alcohol Use: 0.0 oz/week    0 Standard drinks or equivalent per week   family history includes GER disease in her father; Heart attack in her other; Hypertension in her mother; Migraines in her father; Thyroid disease in her brother and mother.  ROS as above Medications: Current Outpatient Prescriptions  Medication Sig Dispense Refill  . Calcium Carbonate Antacid (ANTACID PO) Take by mouth.    . medroxyPROGESTERone (PROVERA) 10 MG tablet Take 1 tablet (10 mg total) by mouth daily. Use for ten days 10 tablet 12  . methocarbamol (ROBAXIN) 500 MG tablet Take 500 mg by mouth 4 (four) times daily.    . naproxen sodium (ANAPROX) 220 MG tablet Take 220 mg by mouth.    . ranitidine (ZANTAC) 150 MG capsule Take 150 mg by mouth.    . Vilazodone HCl (VIIBRYD) 10 MG TABS Take 1 tablet (10 mg  total) by mouth daily. 30 tablet 1   No current facility-administered medications for this visit.   Allergies  Allergen Reactions  . Celexa [Citalopram Hydrobromide]   . Ibuprofen     High doses  . Prednisone   . Prilosec [Omeprazole]   . Wellbutrin [Bupropion]   . Flexeril [Cyclobenzaprine] Other (See Comments)    Dreams     Exam:  BP 145/82 mmHg  Pulse 73  Wt 305 lb (138.347 kg) Gen: Well NAD  Psych: Affect tearful at times. Normal speech thought process. No SI or HI expressed.  No results found for this or any previous visit (from the past 24 hour(s)). No results found.    35 year old woman with anxiety and depression. Slightly improved. Continue Viibryd 10 mg. Refer back to psychology. Recheck in 2-4 weeks.

## 2015-09-19 ENCOUNTER — Telehealth: Payer: Self-pay | Admitting: Family Medicine

## 2015-09-19 MED ORDER — AMOXICILLIN 500 MG PO CAPS: 500.0000 mg | ORAL_CAPSULE | Freq: Three times a day (TID) | ORAL | Status: DC

## 2015-09-19 NOTE — Telephone Encounter (Signed)
Patient was prescribed amoxicillin by her dentist. She notes her symptoms are nearly resolved but still present. She would like a refill of the amoxicillin if possible. We discussed this at her last visit.

## 2015-09-21 ENCOUNTER — Ambulatory Visit (INDEPENDENT_AMBULATORY_CARE_PROVIDER_SITE_OTHER): Payer: BLUE CROSS/BLUE SHIELD | Admitting: Psychology

## 2015-09-21 DIAGNOSIS — F411 Generalized anxiety disorder: Secondary | ICD-10-CM

## 2015-09-21 DIAGNOSIS — F4310 Post-traumatic stress disorder, unspecified: Secondary | ICD-10-CM | POA: Diagnosis not present

## 2015-09-21 DIAGNOSIS — F331 Major depressive disorder, recurrent, moderate: Secondary | ICD-10-CM

## 2015-09-21 DIAGNOSIS — F900 Attention-deficit hyperactivity disorder, predominantly inattentive type: Secondary | ICD-10-CM | POA: Diagnosis not present

## 2015-09-26 ENCOUNTER — Encounter: Payer: Self-pay | Admitting: Family Medicine

## 2015-09-29 ENCOUNTER — Ambulatory Visit (INDEPENDENT_AMBULATORY_CARE_PROVIDER_SITE_OTHER): Payer: BLUE CROSS/BLUE SHIELD | Admitting: Family Medicine

## 2015-09-29 ENCOUNTER — Encounter: Payer: Self-pay | Admitting: Family Medicine

## 2015-09-29 VITALS — BP 120/81 | HR 80 | Wt 304.0 lb

## 2015-09-29 DIAGNOSIS — F419 Anxiety disorder, unspecified: Secondary | ICD-10-CM

## 2015-09-29 DIAGNOSIS — F329 Major depressive disorder, single episode, unspecified: Secondary | ICD-10-CM | POA: Diagnosis not present

## 2015-09-29 DIAGNOSIS — F32A Depression, unspecified: Secondary | ICD-10-CM

## 2015-09-29 DIAGNOSIS — M25562 Pain in left knee: Secondary | ICD-10-CM

## 2015-09-29 NOTE — Patient Instructions (Signed)
Thank you for coming in today.  Kidney counseling. Follow-up with orthopedics. Return in one to 2  Months

## 2015-09-29 NOTE — Progress Notes (Signed)
       Ariana Parks is a 35 y.o. female who presents to [facility]: [facility] today for  Follow up anxiety and depression. Patient has been taking  Viibryd10 mg now for a few weeks. She tolerates the medicine well. She is attended counseling and feels that it is going well.  Additionally she notes that she has new left knee pain. This is a recurrent issue for her. She previously has seen Dr. Stacie Glaze orthopedics in the past and would like a referral back to him.  Past Medical History  Diagnosis Date  . GERD (gastroesophageal reflux disease)   . Panic attack   . High cholesterol   . Depression   . PCOS (polycystic ovarian syndrome)   . Abnormal Pap smear of cervix 2005    Mod dysplasia   Past Surgical History  Procedure Laterality Date  . Tonsillectomy    . Knee surgery    . Wisdom tooth extraction    . Leep     Social History  Substance Use Topics  . Smoking status: Current Every Day Smoker -- 0.50 packs/day for 15 years    Types: Cigarettes  . Smokeless tobacco: Never Used     Comment: using vapes  . Alcohol Use: 0.0 oz/week    0 Standard drinks or equivalent per week   family history includes GER disease in her father; Heart attack in her other; Hypertension in her mother; Migraines in her father; Thyroid disease in her brother and mother.  ROS as above Medications: Current Outpatient Prescriptions  Medication Sig Dispense Refill  . Calcium Carbonate Antacid (ANTACID PO) Take by mouth.    . medroxyPROGESTERone (PROVERA) 10 MG tablet Take 1 tablet (10 mg total) by mouth daily. Use for ten days 10 tablet 12  . methocarbamol (ROBAXIN) 500 MG tablet Take 500 mg by mouth 4 (four) times daily.    . naproxen sodium (ANAPROX) 220 MG tablet Take 220 mg by mouth.    . ranitidine (ZANTAC) 150 MG capsule Take 150 mg by mouth.    . Vilazodone HCl (VIIBRYD) 10 MG TABS Take 1 tablet  (10 mg total) by mouth daily. 30 tablet 1   No current facility-administered medications for this visit.   Allergies  Allergen Reactions  . Celexa [Citalopram Hydrobromide]   . Ibuprofen     High doses  . Prednisone   . Prilosec [Omeprazole]   . Wellbutrin [Bupropion]   . Flexeril [Cyclobenzaprine] Other (See Comments)    Dreams     Exam:  BP 120/81 mmHg  Pulse 80  Wt 304 lb (137.893 kg) Gen: Well NAD HEENT: EOMI,  MMM Lungs: Normal work of breathing. CTABL Heart: RRR no MRG Abd: NABS, Soft. Nondistended, Nontender Exts: Brisk capillary refill, warm and well perfused.  Left knee: Mild effusion. Nontender. Normal motion. Psych: Alert and oriented normal speech thought process and affect.  GAD 7 is 20 PHQ9 is  23  No results found for this or any previous visit (from the past 24 hour(s)). No results found.   Please see individual assessment and plan sections.

## 2015-09-29 NOTE — Assessment & Plan Note (Signed)
Continue counseling and Viibryd

## 2015-09-29 NOTE — Assessment & Plan Note (Signed)
Refer to orthopedics 

## 2015-10-19 ENCOUNTER — Encounter: Payer: Self-pay | Admitting: Rehabilitative and Restorative Service Providers"

## 2015-10-19 ENCOUNTER — Ambulatory Visit (INDEPENDENT_AMBULATORY_CARE_PROVIDER_SITE_OTHER): Payer: BLUE CROSS/BLUE SHIELD | Admitting: Rehabilitative and Restorative Service Providers"

## 2015-10-19 DIAGNOSIS — R29898 Other symptoms and signs involving the musculoskeletal system: Secondary | ICD-10-CM | POA: Diagnosis not present

## 2015-10-19 DIAGNOSIS — M6281 Muscle weakness (generalized): Secondary | ICD-10-CM

## 2015-10-19 DIAGNOSIS — R293 Abnormal posture: Secondary | ICD-10-CM

## 2015-10-19 DIAGNOSIS — M25562 Pain in left knee: Secondary | ICD-10-CM

## 2015-10-19 DIAGNOSIS — M25561 Pain in right knee: Secondary | ICD-10-CM

## 2015-10-19 NOTE — Therapy (Signed)
[place], [place], [address] Phone: [phone]   Fax:  [phone]  Physical Therapy Evaluation  Patient Details  Name: Ariana Parks MRN: [id number] Date of Birth: 06-Apr-1981 Referring Provider: Dr. Theda Sers   Encounter Date: 10/19/2015      PT End of Session - 10/19/15 1444    Visit Number 1   Number of Visits 12   Date for PT Re-Evaluation 11/16/15   PT Start Time [id number]   PT Stop Time 1530   PT Time Calculation (min) 51 min   Activity Tolerance Patient tolerated treatment well;Patient limited by pain      Past Medical History  Diagnosis Date  . GERD (gastroesophageal reflux disease)   . Panic attack   . High cholesterol   . Depression   . PCOS (polycystic ovarian syndrome)   . Abnormal Pap smear of cervix 2005    Mod dysplasia    Past Surgical History  Procedure Laterality Date  . Tonsillectomy    . Knee surgery    . Wisdom tooth extraction    . Leep      There were no vitals filed for this visit.       Subjective Assessment - 10/19/15 1445    Subjective Patient reports that she has been having bilat knee pain for years with symptoms worsening in the past year. She was seen by Dr. Theda Sers 10/12/15 and placed in chondromalacia braces which she is wearing daily when out and about.  She does not wear them when she is sleeping.    Pertinent History lateral release bilat knees in early 2000's with no significant improvement. cervical and bilat shoulder dysfunction treated in PT 10/16 to 2/17; fx of coccyx in middle school   How long can you sit comfortably? ~ 20 min    How long can you stand comfortably? 20 min    How long can you walk comfortably? level surfaces - 15-20 min    Diagnostic tests xrays    Patient Stated Goals get rid of the knee pain and get her knees to cooporate with the rest of her body    Currently in Pain? Yes   Pain Score 5    Pain Location Knee   Pain  Orientation Right   Pain Descriptors / Indicators Dull   Pain Type Chronic pain   Pain Radiating Towards to lateral ankle    Pain Onset More than a month ago   Pain Frequency Intermittent   Aggravating Factors  ascending or descending stairs; squatting; returning to stand from sitting;    Pain Relieving Factors braces help; meds; ice; straightening leg out some    Multiple Pain Sites Yes   Pain Score 2   Pain Location Knee   Pain Orientation Left   Pain Descriptors / Indicators Dull   Pain Type Chronic pain   Pain Onset More than a month ago   Pain Frequency Intermittent   Aggravating Factors  as above    Pain Relieving Factors as above             [facility] PT Assessment - 10/19/15 0001    Assessment   Medical Diagnosis Bilat knee pain/chondromalacia   Referring Provider Dr. Theda Sers    Onset Date/Surgical Date 06/26/15   Hand Dominance Left   Next MD Visit 11/23/15   Prior Therapy after surgery in early 2000's    Precautions   Precaution Comments in braces when walking    Balance  Screen   Has the patient fallen in the past 6 months No   Has the patient had a decrease in activity level because of a fear of falling?  No   Is the patient reluctant to leave their home because of a fear of falling?  No   Home Environment   Additional Comments single level home - difficulty with stairs    Prior Function   Level of Independence Independent   Vocation Part time employment  awaiting RTW - OK from MD following shoulder rehab    Vocation Requirements [employer], lift up to 25#   Leisure cares for brother   Observation/Other Assessments   Focus on Therapeutic Outcomes (FOTO)  56% limitation    Sensation   Additional Comments WFL's per pt report    Posture/Postural Control   Posture Comments  head forward; shoulders rounded; slight fwd flexion noted; LE's in ER    AROM   Right/Left Hip --  WFL's bilat hips    Right Knee Extension -9   Right Knee Flexion 121   Left Knee Extension  -8   Left Knee Flexion 128   Right/Left Ankle --  WFL's bilat DF limited at ~10 deg    Strength   Right Hip Flexion 4/5   Right Hip Extension 4/5   Right Hip ABduction 4/5   Right Hip ADduction 4+/5   Left Hip Flexion 4+/5   Left Hip Extension 4/5   Left Hip ABduction 4/5   Left Hip ADduction --  5-/5   Right Knee Flexion 4+/5   Right Knee Extension 5/5   Left Knee Flexion --  5-/5   Left Knee Extension 5/5   Right/Left Ankle --  5/5 bilat AF/Inv/Eve    Flexibility   Hamstrings tight Rt 75 deg; Lt 75 deg    Quadriceps knee flex prone heel to buttock Rt 105; Lt 113    ITB tight Rt > Lt    Piriformis tight bilat    Palpation   Patella mobility hypermobility noted bilat; lateral tracking bilat     Palpation comment sensitive to palpation lateral patellar area at superior lateral border    Special Tests    Special Tests --  (+) patella grind; lateral tracking with quad set                    [facility] Adult PT Treatment/Exercise - 10/19/15 0001    Iontophoresis   Type of Iontophoresis Dexamethasone   Location bilat lateral knees    Dose 40 mAmp/min    Time 6 hours                 PT Education - 10/19/15 1529    Education provided Yes   Education Details education re ionto   Person(s) Educated Patient   Methods Explanation   Comprehension Verbalized understanding             PT Long Term Goals - 10/19/15 1530    PT LONG TERM GOAL #1   Title Improve strength bilat LE's to 5-/5 to 5/5 11/30/15   Time 6   Period Weeks   Status New   PT LONG TERM GOAL #2   Title Increase knee flexion to 130 deg bilat and extensing to (-)5 deg bilat 11/30/15   Time 6   Period Weeks   Status New   PT LONG TERM GOAL #3   Title Improve patellar tracking with patient demonstrating less pain with functional activities 11/30/15  Time 6   Period Weeks   Status New   PT LONG TERM GOAL #4   Title Independent in HEP 11/30/15   Time 6   Period Weeks   Status New   PT  LONG TERM GOAL #5   Title Improve FOTO to </= 39% limitation 11/30/15   Time 6   Period Weeks   Status New             Patient will benefit from skilled therapeutic intervention in order to improve the following deficits and impairments:     Visit Diagnosis: Pain in right knee - Plan: PT plan of care cert/re-cert  Pain in left knee - Plan: PT plan of care cert/re-cert  Abnormal posture - Plan: PT plan of care cert/re-cert  Other symptoms and signs involving the musculoskeletal system - Plan: PT plan of care cert/re-cert  Muscle weakness (generalized) - Plan: PT plan of care cert/re-cert     Problem List Patient Active Problem List   Diagnosis Date Noted  . Left knee pain 09/29/2015  . PCOS (polycystic ovarian syndrome) 05/31/2015  . Abnormal weight gain 03/10/2015  . Cervical strain 02/25/2015  . Abnormal EEG 01/02/2012  . ADD (attention deficit disorder) 01/02/2012  . Anxiety 01/02/2012  . Clinical depression 01/02/2012  . Current tobacco use 01/02/2012    Dema Timmons Nilda Simmer PT, MPH  10/19/2015, 3:54 PM  [facility] [place], [place], [address] Phone: [phone]   Fax:  [phone]  Name: Enas Suero MRN: [id number] Date of Birth: 1981/02/23

## 2015-10-24 ENCOUNTER — Encounter: Payer: Self-pay | Admitting: Physical Therapy

## 2015-10-26 ENCOUNTER — Ambulatory Visit (INDEPENDENT_AMBULATORY_CARE_PROVIDER_SITE_OTHER): Payer: BLUE CROSS/BLUE SHIELD | Admitting: Physical Therapy

## 2015-10-26 DIAGNOSIS — R29898 Other symptoms and signs involving the musculoskeletal system: Secondary | ICD-10-CM | POA: Diagnosis not present

## 2015-10-26 DIAGNOSIS — M25561 Pain in right knee: Secondary | ICD-10-CM | POA: Diagnosis not present

## 2015-10-26 DIAGNOSIS — R293 Abnormal posture: Secondary | ICD-10-CM | POA: Diagnosis not present

## 2015-10-26 DIAGNOSIS — M25562 Pain in left knee: Secondary | ICD-10-CM

## 2015-10-26 NOTE — Therapy (Signed)
[place], [place], [address] Phone: [phone]   Fax:  [phone]  Physical Therapy Treatment  Patient Details  Name: Ariana Parks MRN: [id number] Date of Birth: 1980-07-19 Referring Provider: Dr. Theda Sers  Encounter Date: 10/26/2015      PT End of Session - 10/26/15 1411    Visit Number 2   Number of Visits 12   Date for PT Re-Evaluation 11/16/15   PT Start Time 1406   PT Stop Time 1505   PT Time Calculation (min) 59 min   Activity Tolerance Patient tolerated treatment well;No increased pain      Past Medical History  Diagnosis Date  . GERD (gastroesophageal reflux disease)   . Panic attack   . High cholesterol   . Depression   . PCOS (polycystic ovarian syndrome)   . Abnormal Pap smear of cervix 2005    Mod dysplasia    Past Surgical History  Procedure Laterality Date  . Tonsillectomy    . Knee surgery    . Wisdom tooth extraction    . Leep      There were no vitals filed for this visit.      Subjective Assessment - 10/26/15 1408    Subjective Pt reports she was only able to keep ionto patches on knees 3 hrs, (started falling off and were leaking).  No changes since last week.     Pertinent History lateral release bilat knees in early 2000's with no significant improvement. cervical and bilat shoulder dysfunction treated in PT 10/16 to 2/17; fx of coccyx in middle school   Currently in Pain? No/denies  no pain at rest.             [facility] PT Assessment - 10/26/15 0001    Assessment   Medical Diagnosis Bilat knee pain/chondromalacia   Referring Provider Dr. Theda Sers   Onset Date/Surgical Date 06/26/15   Hand Dominance Left   Next MD Visit 11/23/15   Prior Therapy after surgery in early 2000's    AROM   Right Knee Flexion 132  supine, heel slide   Left Knee Flexion 134  supine, with heel slide           [facility] Adult PT Treatment/Exercise - 10/26/15 0001    Exercises   Exercises Knee/Hip   Knee/Hip Exercises: Stretches   Passive Hamstring Stretch Right;Left;2 reps;30 seconds  seated with straight back   Quad Stretch Right;Left;2 reps;30 seconds  prone with strap   ITB Stretch Right;Left;2 reps;30 seconds   Knee/Hip Exercises: Aerobic   Nustep NuStep L4: 6 min    Knee/Hip Exercises: Supine   Quad Sets Right;Left;1 set;10 reps  5 sec hold   Bridges 10 reps;1 set  with ball squeeze   Straight Leg Raise with External Rotation Strengthening;Right;Left;2 sets;10 reps   Knee/Hip Exercises: Prone   Hip Extension Strengthening;Right;Left;2 sets;10 reps   Cryotherapy   Number Minutes Cryotherapy 12 Minutes   Cryotherapy Location Knee   Type of Cryotherapy Ice pack           PT Education - 10/26/15 1447    Education provided Yes   Education Details HEP    Person(s) Educated Patient   Methods Explanation;Handout   Comprehension Verbalized understanding;Returned demonstration             PT Long Term Goals - 10/19/15 1530    PT LONG TERM GOAL #1   Title Improve strength bilat LE's to 5-/5 to 5/5 11/30/15  Time 6   Period Weeks   Status New   PT LONG TERM GOAL #2   Title Increase knee flexion to 130 deg bilat and extensing to (-)5 deg bilat 11/30/15   Time 6   Period Weeks   Status New   PT LONG TERM GOAL #3   Title Improve patellar tracking with patient demonstrating less pain with functional activities 11/30/15   Time 6   Period Weeks   Status New   PT LONG TERM GOAL #4   Title Independent in HEP 11/30/15   Time 6   Period Weeks   Status New   PT LONG TERM GOAL #5   Title Improve FOTO to </= 39% limitation 11/30/15   Time 6   Period Weeks   Status New               Plan - 10/26/15 1453    Clinical Impression Statement Pt tolerated all exercises well with minimal to no increase in knee pain.  Pt demonstrated improved bilat knee flexion ROM; has partially met LTG #2.  Pt is progressing towards goals.    Rehab  Potential Good   PT Frequency 2x / week   PT Duration 4 weeks   PT Treatment/Interventions Traction;Ultrasound;Patient/family education;Passive range of motion;Cryotherapy;Electrical Stimulation;Moist Heat;Therapeutic exercise;Manual techniques;Dry needling   PT Next Visit Plan Continue progressive strengthening and ROM for knees.  Modalities as indicated.    Consulted and Agree with Plan of Care Patient      Patient will benefit from skilled therapeutic intervention in order to improve the following deficits and impairments:  Postural dysfunction, Decreased strength, Hypomobility, Pain, Impaired UE functional use, Decreased range of motion  Visit Diagnosis: Pain in right knee  Pain in left knee  Other symptoms and signs involving the musculoskeletal system  Abnormal posture     Problem List Patient Active Problem List   Diagnosis Date Noted  . Left knee pain 09/29/2015  . PCOS (polycystic ovarian syndrome) 05/31/2015  . Abnormal weight gain 03/10/2015  . Cervical strain 02/25/2015  . Abnormal EEG 01/02/2012  . ADD (attention deficit disorder) 01/02/2012  . Anxiety 01/02/2012  . Clinical depression 01/02/2012  . Current tobacco use 01/02/2012   Kerin Perna, PTA 10/26/2015 2:57 PM  [place], [place], [address] Phone: [phone]   Fax:  [phone]  Name: Naz Denunzio MRN: [id number] Date of Birth: [birth year]

## 2015-10-26 NOTE — Patient Instructions (Addendum)
Bridge    Lie back, legs bent. Inhale, pressing hips up. Keeping ribs in, lengthen lower back. Exhale, rolling down along spine from top.  Squeeze towel between knees.  Repeat __10__ times, 1-2 sets.  Do __1__ sessions per day.  Quad Set    With other leg bent, foot flat, slowly tighten muscles on thigh of straight leg while counting out loud to _5___. Repeat with other leg. Repeat __10__ times. Do __several __ sessions per day.  This helps strengthen leg muscle and straighten out knee.    Outer Hip Stretch: Reclined IT Band Stretch (Strap)    Strap around opposite foot, pull across only as far as possible with shoulders on mat. Hold for _30__ seconds. Repeat _2___ times each leg.  KNEE: Quadriceps - Prone    Place strap around ankle. Bring ankle toward buttocks. Press hip into surface. Hold _30__ seconds. _2__ reps per set, _2__ sets per day,  Hamstring Step 2    Left foot relaxed, knee straight, other leg bent, foot flat. Raise straight leg further upward to maximal range. Hold _30__ seconds. Relax leg completely down. Repeat _2__ times.  2x/day   Straight Leg Raise: With External Leg Rotation    Lie on back with right leg straight, opposite leg bent. Rotate straight leg out and lift _6-8___ inches. Repeat __10__ times per set. Do __2__ sets per session. Do __1__ sessions per day.  [email or web address] [place], [place] [address]  [phone] (office) [phone] (fax)

## 2015-10-27 NOTE — Addendum Note (Signed)
Addended by: Everardo All on: 10/27/2015 08:28 AM   Modules accepted: Orders

## 2015-10-31 ENCOUNTER — Ambulatory Visit (INDEPENDENT_AMBULATORY_CARE_PROVIDER_SITE_OTHER): Payer: BLUE CROSS/BLUE SHIELD | Admitting: Rehabilitative and Restorative Service Providers"

## 2015-10-31 ENCOUNTER — Encounter: Payer: Self-pay | Admitting: Rehabilitative and Restorative Service Providers"

## 2015-10-31 DIAGNOSIS — R29898 Other symptoms and signs involving the musculoskeletal system: Secondary | ICD-10-CM | POA: Diagnosis not present

## 2015-10-31 DIAGNOSIS — M25562 Pain in left knee: Secondary | ICD-10-CM

## 2015-10-31 DIAGNOSIS — M25561 Pain in right knee: Secondary | ICD-10-CM | POA: Diagnosis not present

## 2015-10-31 NOTE — Patient Instructions (Signed)
KNEE: Extension, Short Arc Quads      Lie on back with bolster under knees. Straighten knee. Hold __3_ seconds. __10_ reps per set, _1-3__ sets per day  Forward    Facing step, place one leg on step, flexed at hip. Step up slowly, bringing hips in line with knee and shoulder. Bring other foot onto step. Reverse process to step back down. Repeat with other leg. Do ___10_ repetitions, __1-3__ sets.   Lateral Step Up    Stand to side of step. Step up leading with left leg. Slowly step down leading with opposite leg. Perform _10__ reps. 1-3 sets

## 2015-10-31 NOTE — Therapy (Signed)
[place], [place], [address] Phone: [phone]   Fax:  [phone]  Physical Therapy Treatment  Patient Details  Name: Ariana Parks MRN: [id number] Date of Birth: 03/16/1981 Referring Provider: Dr. Theda Sers   Encounter Date: 10/31/2015      PT End of Session - 10/31/15 1531    Visit Number 3   Number of Visits 12   Date for PT Re-Evaluation 11/16/15   PT Start Time 1528  pt 15 min late for appt    PT Stop Time 1614   PT Time Calculation (min) 46 min      Past Medical History  Diagnosis Date  . GERD (gastroesophageal reflux disease)   . Panic attack   . High cholesterol   . Depression   . PCOS (polycystic ovarian syndrome)   . Abnormal Pap smear of cervix 2005    Mod dysplasia    Past Surgical History  Procedure Laterality Date  . Tonsillectomy    . Knee surgery    . Wisdom tooth extraction    . Leep      There were no vitals filed for this visit.      Subjective Assessment - 10/31/15 1532    Subjective Busy day yesterday walking around at the [facility]. She had increased pain in the Lt knee after walking. She was sore after last visit.    Currently in Pain? Yes   Pain Score 2    Pain Location Knee   Pain Orientation Right   Pain Descriptors / Indicators Dull   Pain Type Chronic pain   Pain Onset More than a month ago   Pain Score 2   Pain Location Knee   Pain Orientation Left   Pain Descriptors / Indicators Dull   Pain Type Chronic pain   Pain Onset More than a month ago            [facility] PT Assessment - 10/31/15 0001    Assessment   Medical Diagnosis Bilat knee pain/chondromalacia   Referring Provider Dr. Theda Sers    Onset Date/Surgical Date 06/26/15   Hand Dominance Left   Next MD Visit 11/23/15   Prior Therapy after surgery in early 2000's    AROM   Right Knee Extension -2   Right Knee Flexion 136  supine, heel slide   Left Knee Extension -4    Left Knee Flexion 134  supine, with heel slide   Flexibility   Hamstrings tight Rt 75 deg; Lt 75 deg    Quadriceps knee flex prone heel to buttock Rt 115; Lt 118   ITB tight Rt > Lt    Piriformis tight bilat                      [facility] Adult PT Treatment/Exercise - 10/31/15 0001    Exercises   Exercises Knee/Hip   Knee/Hip Exercises: Stretches   Passive Hamstring Stretch Right;Left;2 reps;30 seconds  seated with straight back   Quad Stretch Right;Left;2 reps;30 seconds  prone with strap   ITB Stretch Right;Left;2 reps;30 seconds   Knee/Hip Exercises: Aerobic   Nustep NuStep L4: 6 min    Knee/Hip Exercises: Standing   Lateral Step Up Right;Left;10 reps;Step Height: 2"   Forward Step Up Right;Left;10 reps;Step Height: 2"   Knee/Hip Exercises: Supine   Quad Sets Right;Left;1 set;10 reps  5 sec hold   Short Arc Quad Sets Right;Left;10 reps  3 sec pause  Bridges 15 reps;1 set  with ball squeeze   Straight Leg Raise with External Rotation Strengthening;Right;Left;2 sets;10 reps   Knee/Hip Exercises: Prone   Hip Extension Strengthening;Right;Left;2 sets;10 reps   Cryotherapy   Number Minutes Cryotherapy 12 Minutes   Cryotherapy Location Knee   Type of Cryotherapy Ice pack                PT Education - 10/31/15 1544    Education provided Yes   Education Details HEP   Person(s) Educated Patient   Methods Explanation;Demonstration;Tactile cues;Verbal cues;Handout   Comprehension Verbalized understanding;Returned demonstration;Verbal cues required;Tactile cues required             PT Long Term Goals - 10/31/15 1532    PT LONG TERM GOAL #1   Title Improve strength bilat LE's to 5-/5 to 5/5 11/30/15   Time 6   Period Weeks   Status On-going   PT LONG TERM GOAL #2   Title Increase knee flexion to 130 deg bilat and extension to (-)5 deg bilat 11/30/15   Time 6   Period Weeks   Status On-going   PT LONG TERM GOAL #3   Title Improve patellar  tracking with patient demonstrating less pain with functional activities 11/30/15   Time 6   Period Weeks   Status On-going   PT LONG TERM GOAL #4   Title Independent in HEP 11/30/15   Time 6   Period Weeks   Status On-going   PT LONG TERM GOAL #5   Title Improve FOTO to </= 39% limitation 11/30/15   Time 6   Period Weeks   Status On-going               Plan - 10/31/15 1535    Clinical Impression Statement Continued pain in bilat knees. No significant change in symptoms - will need to exercise consistently at home to see benefits and improvement.    Rehab Potential Good   PT Frequency 2x / week   PT Duration 6 weeks   PT Treatment/Interventions Patient/family education;ADLs/Self Care Home Management;Cryotherapy;Electrical Stimulation;Iontophoresis 4mg /ml Dexamethasone;Moist Heat;Ultrasound;Neuromuscular re-education;Manual techniques;Dry needling;Therapeutic activities;Therapeutic exercise   PT Next Visit Plan Strengthening and ROM bilat knees; modalities as indicated   Consulted and Agree with Plan of Care Patient      Patient will benefit from skilled therapeutic intervention in order to improve the following deficits and impairments:  Postural dysfunction, Improper body mechanics, Pain, Decreased range of motion, Decreased mobility, Decreased strength, Decreased endurance, Decreased activity tolerance  Visit Diagnosis: Pain in right knee  Pain in left knee  Other symptoms and signs involving the musculoskeletal system     Problem List Patient Active Problem List   Diagnosis Date Noted  . Left knee pain 09/29/2015  . PCOS (polycystic ovarian syndrome) 05/31/2015  . Abnormal weight gain 03/10/2015  . Cervical strain 02/25/2015  . Abnormal EEG 01/02/2012  . ADD (attention deficit disorder) 01/02/2012  . Anxiety 01/02/2012  . Clinical depression 01/02/2012  . Current tobacco use 01/02/2012    Loida Calamia Nilda Simmer PT, MPH  10/31/2015, 4:02 PM  [facility] [place], [place], [address] Phone: [phone]   Fax:  [phone]  Name: Goldye Lehenbauer MRN: [id number] Date of Birth: 1981-06-10

## 2015-11-02 ENCOUNTER — Encounter: Payer: Self-pay | Admitting: Rehabilitative and Restorative Service Providers"

## 2015-11-02 ENCOUNTER — Ambulatory Visit (INDEPENDENT_AMBULATORY_CARE_PROVIDER_SITE_OTHER): Payer: BLUE CROSS/BLUE SHIELD | Admitting: Rehabilitative and Restorative Service Providers"

## 2015-11-02 DIAGNOSIS — M6281 Muscle weakness (generalized): Secondary | ICD-10-CM

## 2015-11-02 DIAGNOSIS — M25562 Pain in left knee: Secondary | ICD-10-CM | POA: Diagnosis not present

## 2015-11-02 DIAGNOSIS — R293 Abnormal posture: Secondary | ICD-10-CM

## 2015-11-02 DIAGNOSIS — R29898 Other symptoms and signs involving the musculoskeletal system: Secondary | ICD-10-CM | POA: Diagnosis not present

## 2015-11-02 DIAGNOSIS — M25561 Pain in right knee: Secondary | ICD-10-CM | POA: Diagnosis not present

## 2015-11-02 NOTE — Therapy (Signed)
[place], [place], [address] Phone: [phone]   Fax:  [phone]  Physical Therapy Treatment  Patient Details  Name: Ariana Parks MRN: [id number] Date of Birth: [birth year] Referring Provider: Dr. Theda Sers   Encounter Date: 11/02/2015      PT End of Session - 11/02/15 1523    Visit Number 4   Number of Visits 12   Date for PT Re-Evaluation 11/16/15   PT Start Time [id number]   PT Stop Time 1613   PT Time Calculation (min) 56 min   Activity Tolerance Patient tolerated treatment well      Past Medical History  Diagnosis Date  . GERD (gastroesophageal reflux disease)   . Panic attack   . High cholesterol   . Depression   . PCOS (polycystic ovarian syndrome)   . Abnormal Pap smear of cervix 2005    Mod dysplasia    Past Surgical History  Procedure Laterality Date  . Tonsillectomy    . Knee surgery    . Wisdom tooth extraction    . Leep      There were no vitals filed for this visit.      Subjective Assessment - 11/02/15 1524    Subjective Rt knee is hurting more than the Lt today. Can't really tell a difference in the pain or strength in the knees/legs. She is doing her exercises at home - at least once a day, sometimes twice    Currently in Pain? Yes   Pain Score 2    Pain Location Knee   Pain Orientation Left   Pain Descriptors / Indicators Dull;Aching   Pain Type Chronic pain   Pain Score 3   Pain Location Knee   Pain Orientation Right   Pain Descriptors / Indicators Aching;Dull   Pain Type Chronic pain                         [facility] Adult PT Treatment/Exercise - 11/02/15 0001    Exercises   Exercises Knee/Hip   Knee/Hip Exercises: Stretches   Passive Hamstring Stretch Right;Left;2 reps;30 seconds  seated with straight back   Quad Stretch Right;Left;2 reps;30 seconds  prone with strap   ITB Stretch Right;Left;2 reps;30 seconds   Knee/Hip Exercises:  Aerobic   Nustep NuStep L4: 5 min    Knee/Hip Exercises: Standing   Heel Raises 20 reps   SLS Rt/Lt 30 sec x 2 each    Other Standing Knee Exercises weight shift in stance activating quads - neuromuscular re-education 20 x each LE    Knee/Hip Exercises: Supine   Quad Sets Right;Left;1 set;10 reps  5 sec hold   Short Arc Quad Sets Right;Left;10 reps  3 sec pause    Bridges 15 reps;1 set  with ball squeeze   Straight Leg Raise with External Rotation Strengthening;Right;Left;2 sets;10 reps   Knee/Hip Exercises: Prone   Hip Extension Strengthening;Right;Left;2 sets;10 reps   Cryotherapy   Number Minutes Cryotherapy 10 Minutes   Cryotherapy Location Knee   Type of Cryotherapy Ice pack                PT Education - 11/02/15 1542    Education provided Yes   Education Details importance of HEP    Person(s) Educated Patient   Methods Explanation   Comprehension Verbalized understanding             PT Long Term Goals - 10/31/15 1532  PT LONG TERM GOAL #1   Title Improve strength bilat LE's to 5-/5 to 5/5 11/30/15   Time 6   Period Weeks   Status On-going   PT LONG TERM GOAL #2   Title Increase knee flexion to 130 deg bilat and extension to (-)5 deg bilat 11/30/15   Time 6   Period Weeks   Status On-going   PT LONG TERM GOAL #3   Title Improve patellar tracking with patient demonstrating less pain with functional activities 11/30/15   Time 6   Period Weeks   Status On-going   PT LONG TERM GOAL #4   Title Independent in HEP 11/30/15   Time 6   Period Weeks   Status On-going   PT LONG TERM GOAL #5   Title Improve FOTO to </= 39% limitation 11/30/15   Time 6   Period Weeks   Status On-going               Plan - 11/02/15 1543    Clinical Impression Statement Continued pain. Added exercise without difficulty.    Rehab Potential Good   PT Frequency 2x / week   PT Duration 6 weeks   PT Treatment/Interventions Patient/family education;ADLs/Self Care Home  Management;Cryotherapy;Electrical Stimulation;Iontophoresis 4mg /ml Dexamethasone;Moist Heat;Ultrasound;Neuromuscular re-education;Manual techniques;Dry needling;Therapeutic activities;Therapeutic exercise   PT Next Visit Plan Strengthening and ROM bilat knees; modalities as indicated   PT Home Exercise Plan HEP    Consulted and Agree with Plan of Care Patient      Patient will benefit from skilled therapeutic intervention in order to improve the following deficits and impairments:  Postural dysfunction, Improper body mechanics, Pain, Decreased range of motion, Decreased mobility, Decreased strength, Decreased endurance, Decreased activity tolerance  Visit Diagnosis: Pain in right knee  Pain in left knee  Other symptoms and signs involving the musculoskeletal system  Abnormal posture  Muscle weakness (generalized)     Problem List Patient Active Problem List   Diagnosis Date Noted  . Left knee pain 09/29/2015  . PCOS (polycystic ovarian syndrome) 05/31/2015  . Abnormal weight gain 03/10/2015  . Cervical strain 02/25/2015  . Abnormal EEG 01/02/2012  . ADD (attention deficit disorder) 01/02/2012  . Anxiety 01/02/2012  . Clinical depression 01/02/2012  . Current tobacco use 01/02/2012    Kennethia Lynes Nilda Simmer PT, MPH  11/02/2015, 4:07 PM  [facility] [place], [place], [address] Phone: [phone]   Fax:  [phone]  Name: Ariana Parks MRN: [id number] Date of Birth: 1980-11-18

## 2015-11-07 ENCOUNTER — Ambulatory Visit (INDEPENDENT_AMBULATORY_CARE_PROVIDER_SITE_OTHER): Payer: BLUE CROSS/BLUE SHIELD | Admitting: Physical Therapy

## 2015-11-07 DIAGNOSIS — M25562 Pain in left knee: Secondary | ICD-10-CM

## 2015-11-07 DIAGNOSIS — R29898 Other symptoms and signs involving the musculoskeletal system: Secondary | ICD-10-CM

## 2015-11-07 DIAGNOSIS — M25561 Pain in right knee: Secondary | ICD-10-CM

## 2015-11-07 NOTE — Therapy (Signed)
[place], [place], [address] Phone: [phone]   Fax:  [phone]  Physical Therapy Treatment  Patient Details  Name: Ariana Parks MRN: [id number] Date of Birth: 29-Jan-1981 Referring Provider: Dr. Theda Sers  Encounter Date: 11/07/2015      PT End of Session - 11/07/15 1405    Visit Number 5   Number of Visits 12   Date for PT Re-Evaluation 11/16/15   PT Start Time 1402   PT Stop Time 1450   PT Time Calculation (min) 48 min   Activity Tolerance Patient tolerated treatment well      Past Medical History  Diagnosis Date  . GERD (gastroesophageal reflux disease)   . Panic attack   . High cholesterol   . Depression   . PCOS (polycystic ovarian syndrome)   . Abnormal Pap smear of cervix 2005    Mod dysplasia    Past Surgical History  Procedure Laterality Date  . Tonsillectomy    . Knee surgery    . Wisdom tooth extraction    . Leep      There were no vitals filed for this visit.      Subjective Assessment - 11/07/15 1406    Subjective Pt reports no new changes since last visit.     Patient Stated Goals get rid of the knee pain and get her knees to cooporate with the rest of her body    Currently in Pain? Yes   Pain Score 1    Pain Location Knee   Pain Orientation Right;Left   Pain Descriptors / Indicators Dull   Aggravating Factors  ascending or descending stairs, squatting   Pain Relieving Factors medicine, ice, braces.             [facility] PT Assessment - 11/07/15 0001    Assessment   Medical Diagnosis Bilat knee pain/chondromalacia   Referring Provider Dr. Theda Sers   Onset Date/Surgical Date 06/26/15   Hand Dominance Left   Next MD Visit 11/23/15   Strength   Right/Left Hip Right;Left   Right Hip Flexion 4+/5   Right Hip Extension --  5-/5   Right Hip ABduction --  5-/5   Left Hip Flexion 4+/5   Left Hip Extension --  5-/5   Left Hip ABduction --  5-/5            [facility] Adult PT Treatment/Exercise - 11/07/15 0001    Knee/Hip Exercises: Stretches   Passive Hamstring Stretch Right;Left;2 reps;30 seconds  supine with strap    Quad Stretch --   Gastroc Stretch Right;Left;2 reps;30 seconds   Soleus Stretch Right;Left;2 reps;30 seconds   Knee/Hip Exercises: Aerobic   Recumbent Bike L1: 2 min   increased pain, switched to NuStep   Nustep NuStep L4: 4 min    Knee/Hip Exercises: Standing   Heel Raises 20 reps   Lateral Step Up Right;Left;1 set;10 reps;Hand Hold: 2;Step Height: 2"   Step Down Right;Left;1 set;5 reps;Step Height: 2";Hand Hold: 2   SLS Rt/Lt 30 sec; repeated with horizontal head turns with occasional UE support.    Other Standing Knee Exercises weight shift in stance activating quads - neuromuscular re-education 20 x each LE    Cryotherapy   Number Minutes Cryotherapy 12 Minutes   Cryotherapy Location Knee   Type of Cryotherapy Ice pack   Manual Therapy   Manual Therapy Taping   Manual therapy comments Dynamic tape applied to the lateral portion of bilat knees in  C shape to assist in proper tracking and decrease pain.                      PT Long Term Goals - 10/31/15 1532    PT LONG TERM GOAL #1   Title Improve strength bilat LE's to 5-/5 to 5/5 11/30/15   Time 6   Period Weeks   Status On-going   PT LONG TERM GOAL #2   Title Increase knee flexion to 130 deg bilat and extension to (-)5 deg bilat 11/30/15   Time 6   Period Weeks   Status On-going   PT LONG TERM GOAL #3   Title Improve patellar tracking with patient demonstrating less pain with functional activities 11/30/15   Time 6   Period Weeks   Status On-going   PT LONG TERM GOAL #4   Title Independent in HEP 11/30/15   Time 6   Period Weeks   Status On-going   PT LONG TERM GOAL #5   Title Improve FOTO to </= 39% limitation 11/30/15   Time 6   Period Weeks   Status On-going               Plan - 11/07/15 1711    Clinical Impression  Statement Pt tolerated all exercises with mild increase in pain; reduced with use of ice at end of session.  Trial of tape to knee to assist in patellar tracking. Progressing towards goals with improved Hip strength.    Rehab Potential Good   PT Frequency 2x / week   PT Duration 6 weeks   PT Treatment/Interventions Patient/family education;ADLs/Self Care Home Management;Cryotherapy;Electrical Stimulation;Iontophoresis 4mg /ml Dexamethasone;Moist Heat;Ultrasound;Neuromuscular re-education;Manual techniques;Dry needling;Therapeutic activities;Therapeutic exercise   PT Next Visit Plan Strengthening and ROM bilat knees; modalities as indicated   Consulted and Agree with Plan of Care Patient      Patient will benefit from skilled therapeutic intervention in order to improve the following deficits and impairments:  Postural dysfunction, Improper body mechanics, Pain, Decreased range of motion, Decreased mobility, Decreased strength, Decreased endurance, Decreased activity tolerance  Visit Diagnosis: Pain in right knee  Pain in left knee  Other symptoms and signs involving the musculoskeletal system     Problem List Patient Active Problem List   Diagnosis Date Noted  . Left knee pain 09/29/2015  . PCOS (polycystic ovarian syndrome) 05/31/2015  . Abnormal weight gain 03/10/2015  . Cervical strain 02/25/2015  . Abnormal EEG 01/02/2012  . ADD (attention deficit disorder) 01/02/2012  . Anxiety 01/02/2012  . Clinical depression 01/02/2012  . Current tobacco use 01/02/2012   Ariana Parks, PTA 11/07/2015 5:12 PM  [place], [place], [address] Phone: [phone]   Fax:  [phone]  Name: Ariana Parks MRN: [id number] Date of Birth: 1980/10/08

## 2015-11-09 ENCOUNTER — Encounter: Payer: Self-pay | Admitting: Family Medicine

## 2015-11-09 ENCOUNTER — Ambulatory Visit (INDEPENDENT_AMBULATORY_CARE_PROVIDER_SITE_OTHER): Payer: BLUE CROSS/BLUE SHIELD | Admitting: Family Medicine

## 2015-11-09 VITALS — BP 121/84 | HR 73 | Wt 312.0 lb

## 2015-11-09 DIAGNOSIS — F329 Major depressive disorder, single episode, unspecified: Secondary | ICD-10-CM | POA: Diagnosis not present

## 2015-11-09 DIAGNOSIS — F32A Depression, unspecified: Secondary | ICD-10-CM

## 2015-11-09 DIAGNOSIS — M25562 Pain in left knee: Secondary | ICD-10-CM

## 2015-11-09 DIAGNOSIS — F419 Anxiety disorder, unspecified: Secondary | ICD-10-CM

## 2015-11-09 MED ORDER — VILAZODONE HCL 10 MG PO TABS: 15.0000 mg | ORAL_TABLET | Freq: Every day | ORAL | Status: DC

## 2015-11-09 NOTE — Patient Instructions (Signed)
Thank you for coming in today. Increase Viibryd to 1.5 tablets daily.  Return in 1 month.

## 2015-11-09 NOTE — Progress Notes (Signed)
       Ariana Parks is a 35 y.o. female who presents to [facility] today for depression and anxiety. Patient is doing pretty well with Viibryd 10 mg. Additionally she's seen a counselor/therapist regularly and feels this is very helpful. She is experiencing less stress and anxiety in her daily life.  Additionally she notes her knee pain is improving with physical therapy.   Past Medical History  Diagnosis Date  . GERD (gastroesophageal reflux disease)   . Panic attack   . High cholesterol   . Depression   . PCOS (polycystic ovarian syndrome)   . Abnormal Pap smear of cervix 2005    Mod dysplasia   Past Surgical History  Procedure Laterality Date  . Tonsillectomy    . Knee surgery    . Wisdom tooth extraction    . Leep     Social History  Substance Use Topics  . Smoking status: Current Every Day Smoker -- 0.50 packs/day for 15 years    Types: Cigarettes  . Smokeless tobacco: Never Used     Comment: using vapes  . Alcohol Use: 0.0 oz/week    0 Standard drinks or equivalent per week   family history includes GER disease in her father; Heart attack in her other; Hypertension in her mother; Migraines in her father; Thyroid disease in her brother and mother.  ROS as above Medications: Current Outpatient Prescriptions  Medication Sig Dispense Refill  . Calcium Carbonate Antacid (ANTACID PO) Take by mouth.    . medroxyPROGESTERone (PROVERA) 10 MG tablet Take 1 tablet (10 mg total) by mouth daily. Use for ten days 10 tablet 12  . methocarbamol (ROBAXIN) 500 MG tablet Take 500 mg by mouth 4 (four) times daily.    . naproxen sodium (ANAPROX) 220 MG tablet Take 220 mg by mouth.    . ranitidine (ZANTAC) 150 MG capsule Take 150 mg by mouth.    . Vilazodone HCl (VIIBRYD) 10 MG TABS Take 1.5 tablets (15 mg total) by mouth daily. 45 tablet 1   No current facility-administered  medications for this visit.   Allergies  Allergen Reactions  . Celexa [Citalopram Hydrobromide]   . Ibuprofen     High doses  . Prednisone   . Prilosec [Omeprazole]   . Wellbutrin [Bupropion]   . Flexeril [Cyclobenzaprine] Other (See Comments)    Dreams     Exam:  BP 121/84 mmHg  Pulse 73  Wt 312 lb (141.522 kg) Gen: Well NAD HEENT: EOMI,  MMM Lungs: Normal work of breathing. CTABL Heart: RRR no MRG Abd: NABS, Soft. Nondistended, Nontender Exts: Brisk capillary refill, warm and well perfused.  Psych alert and oriented normal speech thought process and affect.    GAD 7 is 13 PHQ9 is 19.  No results found for this or any previous visit (from the past 24 hour(s)). No results found.

## 2015-11-09 NOTE — Assessment & Plan Note (Signed)
Improving continue physical therapy.

## 2015-11-09 NOTE — Assessment & Plan Note (Signed)
Improving. Continue Viibryd in counseling.

## 2015-11-09 NOTE — Assessment & Plan Note (Signed)
Improving. Continue Viibryd. Patient would like to increase to 15 mg daily. This is a nonstandard dose. Will use 1.5 tablets of 10 mg Viibryd and recheck in one month.

## 2015-11-10 ENCOUNTER — Ambulatory Visit (INDEPENDENT_AMBULATORY_CARE_PROVIDER_SITE_OTHER): Payer: BLUE CROSS/BLUE SHIELD | Admitting: Physical Therapy

## 2015-11-10 DIAGNOSIS — M25562 Pain in left knee: Secondary | ICD-10-CM

## 2015-11-10 DIAGNOSIS — M25561 Pain in right knee: Secondary | ICD-10-CM | POA: Diagnosis not present

## 2015-11-10 DIAGNOSIS — R29898 Other symptoms and signs involving the musculoskeletal system: Secondary | ICD-10-CM

## 2015-11-10 NOTE — Therapy (Signed)
[place], [place], [address] Phone: [phone]   Fax:  [phone]  Physical Therapy Treatment  Patient Details  Name: Ariana Parks MRN: [id number] Date of Birth: 1981-01-20 Referring Provider: Dr. Theda Sers  Encounter Date: 11/10/2015      PT End of Session - 11/10/15 1454    Visit Number 6   Number of Visits 12   Date for PT Re-Evaluation 11/16/15   PT Start Time 8315   PT Stop Time 1503   PT Time Calculation (min) 61 min   Activity Tolerance Patient tolerated treatment well      Past Medical History  Diagnosis Date  . GERD (gastroesophageal reflux disease)   . Panic attack   . High cholesterol   . Depression   . PCOS (polycystic ovarian syndrome)   . Abnormal Pap smear of cervix 2005    Mod dysplasia    Past Surgical History  Procedure Laterality Date  . Tonsillectomy    . Knee surgery    . Wisdom tooth extraction    . Leep      There were no vitals filed for this visit.      Subjective Assessment - 11/10/15 1405    Subjective Pt reports minimal change since last visit.  "My knees are always popping and hurt to some degree".    Pt reports the tape didn't stay on, it began to roll off by the same evening. Didn't notice any difference with tape on.     Patient Stated Goals get rid of the knee pain and get her knees to cooporate with the rest of her body    Currently in Pain? Yes   Pain Score 3    Pain Location Knee   Pain Orientation Right   Pain Descriptors / Indicators Aching   Aggravating Factors  stairs, squatting    Pain Relieving Factors medicine, braces   Multiple Pain Sites No            [facility] PT Assessment - 11/10/15 0001    Assessment   Medical Diagnosis Bilat knee pain/chondromalacia   Referring Provider Dr. Theda Sers   Onset Date/Surgical Date 06/26/15   Hand Dominance Left   Next MD Visit 11/23/15   AROM   Right Knee Extension -2   Right Knee  Flexion 137   Left Knee Extension -3   Left Knee Flexion 135         [facility] Adult PT Treatment/Exercise - 11/10/15 0001    Knee/Hip Exercises: Stretches   Passive Hamstring Stretch Right;Left;2 reps;30 seconds  supine with strap    Quad Stretch Right;Left;2 reps;30 seconds  prone with strap   ITB Stretch Right;Left;2 reps;30 seconds   Gastroc Stretch Right;Left;2 reps;30 seconds   Knee/Hip Exercises: Aerobic   Nustep NuStep L4: 6 min    Knee/Hip Exercises: Standing   Functional Squat 5 reps  audible crepitus with pain; stopped   SLS Rt/Lt 30 sec; repeated with horizontal head turns with occasional UE support.    Other Standing Knee Exercises weight shift in stance activating quads - 10 x each LE    Knee/Hip Exercises: Supine   Bridges 15 reps;1 set  with ball squeeze, 5 sec hold.    Straight Leg Raise with External Rotation Strengthening;Right;Left;2 sets;10 reps   Knee/Hip Exercises: Prone   Prone Knee Hang 2 minutes   Straight Leg Raises Right;Left;1 set;10 reps   Straight Leg Raises Limitations opposite arm / leg lift x 10  each side    Cryotherapy   Number Minutes Cryotherapy 12 Minutes   Cryotherapy Location Knee   Type of Cryotherapy Ice pack                     PT Long Term Goals - 11/10/15 1414    PT LONG TERM GOAL #1   Title Improve strength bilat LE's to 5-/5 to 5/5 11/30/15   Time 6   Period Weeks   Status On-going   PT LONG TERM GOAL #2   Title Increase knee flexion to 130 deg bilat and extension to (-)5 deg bilat 11/30/15   Time 6   Period Weeks   Status Achieved   PT LONG TERM GOAL #3   Title Improve patellar tracking with patient demonstrating less pain with functional activities 11/30/15   Time 6   Period Weeks   Status On-going   PT LONG TERM GOAL #4   Title Independent in HEP 11/30/15   Time 6   Period Weeks   Status On-going   PT LONG TERM GOAL #5   Title Improve FOTO to </= 39% limitation 11/30/15   Time 6   Period Weeks   Status  On-going               Plan - 11/10/15 1447    Clinical Impression Statement Pt demonstrated improved knee ROM, has met LTG #2.  Tape trial unsuccessful.     Rehab Potential Good   PT Frequency 2x / week   PT Duration 6 weeks   PT Treatment/Interventions Patient/family education;ADLs/Self Care Home Management;Cryotherapy;Electrical Stimulation;Iontophoresis 9m/ml Dexamethasone;Moist Heat;Ultrasound;Neuromuscular re-education;Manual techniques;Dry needling;Therapeutic activities;Therapeutic exercise   PT Next Visit Plan Strengthening and ROM bilat knees; modalities as indicated   PT Home Exercise Plan pt to try heat on knees for pain relief.       Patient will benefit from skilled therapeutic intervention in order to improve the following deficits and impairments:  Postural dysfunction, Improper body mechanics, Pain, Decreased range of motion, Decreased mobility, Decreased strength, Decreased endurance, Decreased activity tolerance  Visit Diagnosis: Pain in right knee  Pain in left knee  Other symptoms and signs involving the musculoskeletal system     Problem List Patient Active Problem List   Diagnosis Date Noted  . Left knee pain 09/29/2015  . PCOS (polycystic ovarian syndrome) 05/31/2015  . Abnormal weight gain 03/10/2015  . Abnormal EEG 01/02/2012  . ADD (attention deficit disorder) 01/02/2012  . Anxiety 01/02/2012  . Clinical depression 01/02/2012  . Current tobacco use 01/02/2012   JKerin Perna PTA 11/10/2015 4:13 PM  [facility] [place] [address] [address]Phone: [phone]  Fax:  [phone] Name: LKiyara BouffardMRN: [id number] of Birth: 2May 29, 1982

## 2015-11-11 ENCOUNTER — Telehealth: Payer: Self-pay | Admitting: *Deleted

## 2015-11-11 NOTE — Telephone Encounter (Signed)
PA initiated through covermymeds Key: [id number] - PA Case ID: [id number]

## 2015-11-14 ENCOUNTER — Ambulatory Visit (INDEPENDENT_AMBULATORY_CARE_PROVIDER_SITE_OTHER): Payer: BLUE CROSS/BLUE SHIELD | Admitting: Physical Therapy

## 2015-11-14 DIAGNOSIS — M25561 Pain in right knee: Secondary | ICD-10-CM

## 2015-11-14 DIAGNOSIS — M25562 Pain in left knee: Secondary | ICD-10-CM | POA: Diagnosis not present

## 2015-11-14 DIAGNOSIS — R29898 Other symptoms and signs involving the musculoskeletal system: Secondary | ICD-10-CM | POA: Diagnosis not present

## 2015-11-14 NOTE — Therapy (Signed)
[address] [place], [place], [address] Phone: [phone]   Fax:  [phone]  Physical Therapy Treatment  Patient Details  Name: Ariana Parks MRN: [id number] Date of Birth: 1981/01/12 Referring Provider: Dr. Theda Sers   Encounter Date: 11/14/2015      PT End of Session - 11/14/15 1438    Visit Number 7   Number of Visits 12   Date for PT Re-Evaluation 11/16/15   PT Start Time 1435   PT Stop Time 1531   PT Time Calculation (min) 56 min   Activity Tolerance Patient tolerated treatment well      Past Medical History  Diagnosis Date  . GERD (gastroesophageal reflux disease)   . Panic attack   . High cholesterol   . Depression   . PCOS (polycystic ovarian syndrome)   . Abnormal Pap smear of cervix 2005    Mod dysplasia    Past Surgical History  Procedure Laterality Date  . Tonsillectomy    . Knee surgery    . Wisdom tooth extraction    . Leep      There were no vitals filed for this visit.      Subjective Assessment - 11/14/15 1439    Subjective Pt tearful during treatments, reporting her grandmother has been hosptialized.  Her Rt knee had increased pain yesterday, but she attributes this to extra walking at [place] and the rain.  Pt returns to work Midwife.    Pertinent History lateral release bilat knees in early 2000's with no significant improvement. cervical and bilat shoulder dysfunction treated in PT 10/16 to 2/17; fx of coccyx in middle school   Patient Stated Goals get rid of the knee pain and get her knees to cooporate with the rest of her body    Currently in Pain? Yes   Pain Score 5    Pain Location Knee   Pain Orientation Right   Pain Descriptors / Indicators Aching   Aggravating Factors  stairs, squatting    Pain Relieving Factors medicine, braces             [facility] PT Assessment - 11/14/15 0001    Assessment   Medical Diagnosis Bilat knee pain/chondromalacia    Referring Provider Dr. Theda Sers    Onset Date/Surgical Date 06/26/15   Hand Dominance Left   Next MD Visit 11/23/15   Strength   Right Hip Flexion --  5-/5, with discomfort   Right Hip Extension 5/5   Right Hip ABduction 4+/5   Left Hip Flexion 5/5   Left Hip Extension 5/5   Left Hip ABduction 5/5   Right Knee Flexion 5/5   Right Knee Extension 5/5   Left Knee Flexion 5/5   Left Knee Extension 5/5                     [facility] Adult PT Treatment/Exercise - 11/14/15 0001    Self-Care   Self-Care Other Self-Care Comments   Other Self-Care Comments  pt instructed in self massage with roller to LE musculature to reduce tightness and pain.  Pt returned demo    Knee/Hip Exercises: Stretches   Passive Hamstring Stretch Right;Left;2 reps;30 seconds  supine with strap    Quad Stretch Right;Left;2 reps;30 seconds  prone with strap   Gastroc Stretch Right;Left;2 reps;30 seconds   Knee/Hip Exercises: Aerobic   Nustep NuStep L5: 5 min    Knee/Hip Exercises: Standing   Wall Squat 1 set;10 reps  ~  40deg, to tolerance   Knee/Hip Exercises: Sidelying   Hip ABduction Strengthening;Right;Left;1 set;10 reps   Modalities   Modalities Ultrasound   Cryotherapy   Number Minutes Cryotherapy 12 Minutes   Cryotherapy Location Knee  bilat   Type of Cryotherapy Ice pack   Ultrasound   Ultrasound Location Rt ant/medial knee    Ultrasound Parameters 50%, 1.1 w/cm2, 8 min    Ultrasound Goals Pain;Edema                     PT Long Term Goals - 11/10/15 1414    PT LONG TERM GOAL #1   Title Improve strength bilat LE's to 5-/5 to 5/5 11/30/15   Time 6   Period Weeks   Status On-going   PT LONG TERM GOAL #2   Title Increase knee flexion to 130 deg bilat and extension to (-)5 deg bilat 11/30/15   Time 6   Period Weeks   Status Achieved   PT LONG TERM GOAL #3   Title Improve patellar tracking with patient demonstrating less pain with functional activities 11/30/15   Time 6    Period Weeks   Status On-going   PT LONG TERM GOAL #4   Title Independent in HEP 11/30/15   Time 6   Period Weeks   Status On-going   PT LONG TERM GOAL #5   Title Improve FOTO to </= 39% limitation 11/30/15   Time 6   Period Weeks   Status On-going               Plan - 11/14/15 1443    Clinical Impression Statement Pt demonstrated improved knee and hip strength, continues with weak Rt hip abductors. Pt close to meeting LTG #1. Pt tolerated all exercises well, improved tolerance in NWB positions. Progressing towards remaining goals.    Rehab Potential Good   PT Frequency 2x / week   PT Duration 6 weeks   PT Treatment/Interventions Patient/family education;ADLs/Self Care Home Management;Cryotherapy;Electrical Stimulation;Iontophoresis 4mg /ml Dexamethasone;Moist Heat;Ultrasound;Neuromuscular re-education;Manual techniques;Dry needling;Therapeutic activities;Therapeutic exercise   PT Next Visit Plan Continue progressive strengthening and ROM bilat knees; modalities as indicated.  Assess goals and need for additional visits vs d/c to HEP. FOTO.    Consulted and Agree with Plan of Care Patient      Patient will benefit from skilled therapeutic intervention in order to improve the following deficits and impairments:  Postural dysfunction, Improper body mechanics, Pain, Decreased range of motion, Decreased mobility, Decreased strength, Decreased endurance, Decreased activity tolerance  Visit Diagnosis: Pain in right knee  Pain in left knee  Other symptoms and signs involving the musculoskeletal system     Problem List Patient Active Problem List   Diagnosis Date Noted  . Left knee pain 09/29/2015  . PCOS (polycystic ovarian syndrome) 05/31/2015  . Abnormal weight gain 03/10/2015  . Abnormal EEG 01/02/2012  . ADD (attention deficit disorder) 01/02/2012  . Anxiety 01/02/2012  . Clinical depression 01/02/2012  . Current tobacco use 01/02/2012   Kerin Perna,  PTA 11/14/2015 3:23 PM  [address] [place], [place], [address] Phone: [phone]   Fax:  [phone]  Name: Ariana Parks MRN: [id number] Date of Birth: Nov 29, 1980

## 2015-11-15 NOTE — Telephone Encounter (Signed)
viibryd has been approved through 10/2016  Left message on patient vm

## 2015-11-17 ENCOUNTER — Ambulatory Visit (INDEPENDENT_AMBULATORY_CARE_PROVIDER_SITE_OTHER): Payer: BLUE CROSS/BLUE SHIELD | Admitting: Physical Therapy

## 2015-11-17 DIAGNOSIS — M25562 Pain in left knee: Secondary | ICD-10-CM

## 2015-11-17 DIAGNOSIS — M25561 Pain in right knee: Secondary | ICD-10-CM | POA: Diagnosis not present

## 2015-11-17 DIAGNOSIS — R29898 Other symptoms and signs involving the musculoskeletal system: Secondary | ICD-10-CM | POA: Diagnosis not present

## 2015-11-18 NOTE — Therapy (Signed)
[place] [address] [place], [place], [address] Phone: [phone]   Fax:  [phone]  Physical Therapy Treatment  Patient Details  Name: Ariana Parks MRN: [id number] Date of Birth: [birth year] Referring Provider: Dr. Theda Sers   Encounter Date: 11/17/2015      PT End of Session - 11/17/15 1525    Visit Number 8   Number of Visits 12   Date for PT Re-Evaluation 11/16/15   PT Start Time 1430   PT Stop Time 1538   PT Time Calculation (min) 68 min   Activity Tolerance Patient tolerated treatment well   Behavior During Therapy [facility] for tasks assessed/performed      Past Medical History  Diagnosis Date  . GERD (gastroesophageal reflux disease)   . Panic attack   . High cholesterol   . Depression   . PCOS (polycystic ovarian syndrome)   . Abnormal Pap smear of cervix 2005    Mod dysplasia    Past Surgical History  Procedure Laterality Date  . Tonsillectomy    . Knee surgery    . Wisdom tooth extraction    . Leep      There were no vitals filed for this visit.      Subjective Assessment - 11/17/15 1449    Subjective Pt returned to work 3 days ago (for 3 hours) and at end of shift, "every thing was sore".  She wore her braces each day, and this helped control pain.     Pertinent History lateral release bilat knees in early 2000's with no significant improvement. cervical and bilat shoulder dysfunction treated in PT 10/16 to 2/17; fx of coccyx in middle school   Patient Stated Goals get rid of the knee pain and get her knees to cooporate with the rest of her body    Currently in Pain? Yes   Pain Score 3    Pain Location Knee   Pain Orientation Right   Pain Descriptors / Indicators Aching   Aggravating Factors  stairs, squatting    Pain Relieving Factors medicine, braces.    Multiple Pain Sites Yes   Pain Score 1   Pain Location Knee   Pain Orientation Left   Pain Descriptors / Indicators Aching   Aggravating Factors  as above    Pain Relieving Factors as above             [facility] PT Assessment - 11/18/15 0001    Assessment   Medical Diagnosis Bilat knee pain/chondromalacia   Onset Date/Surgical Date 06/26/15   Hand Dominance Left   Next MD Visit 11/23/15   Observation/Other Assessments   Focus on Therapeutic Outcomes (FOTO)  49% limited,  56% limted at intake and goal of 39%.    AROM   Right Knee Extension -2   Right Knee Flexion 139   Left Knee Extension -2   Left Knee Flexion 138   Strength   Right Hip Flexion --  5-/5, with discomfort   Right Hip Extension 5/5   Right Hip ABduction 4+/5   Left Hip Flexion 5/5   Left Hip Extension 5/5   Right Knee Flexion 5/5   Right Knee Extension 5/5   Left Knee Flexion 5/5   Left Knee Extension 5/5   Flexibility   Hamstrings tight Rt 75 deg; Lt 75 deg    Quadriceps knee flex prone heel to buttock Rt 128 knee flexion; Lt 125 knee flex  [facility] Adult PT Treatment/Exercise - 11/18/15 0001    Knee/Hip Exercises: Stretches   Passive Hamstring Stretch Right;Left;2 reps;30 seconds  supine with strap    Quad Stretch Right;Left;2 reps;30 seconds  prone with strap   ITB Stretch Right;Left;2 reps;30 seconds   Gastroc Stretch Right;Left;2 reps;30 seconds   Knee/Hip Exercises: Aerobic   Nustep NuStep L4: 7 min    Knee/Hip Exercises: Standing   Wall Squat Limitations sustained 45 deg knee flexion with 3# weight into core and out with arms, x 4 reps (challenging.    Other Standing Knee Exercises warrior pose held for 10 sec x 5 reps each side.    Knee/Hip Exercises: Sidelying   Hip ABduction Strengthening;Right;Left;1 set;10 reps  arc with forward/backward taps.    Cryotherapy   Number Minutes Cryotherapy 12 Minutes   Cryotherapy Location Knee  bilat   Type of Cryotherapy Ice pack   Manual Therapy   Manual Therapy Taping   Manual therapy comments Dynamic tape applied to the lateral portion of right  knees in C shape to assist in proper tracking and decrease pain.                      PT Long Term Goals - 11/18/15 1025    PT LONG TERM GOAL #1   Title Improve strength bilat LE's to 5-/5 to 5/5 11/30/15   Time 6   Period Weeks   Status Partially Met   PT LONG TERM GOAL #2   Title Increase knee flexion to 130 deg bilat and extension to (-)5 deg bilat 11/30/15   Time 6   Period Weeks   Status Achieved   PT LONG TERM GOAL #3   Title Improve patellar tracking with patient demonstrating less pain with functional activities 11/30/15   Time 6   Period Weeks   Status On-going   PT LONG TERM GOAL #4   Title Independent in HEP 11/30/15   Time 6   Period Weeks   PT LONG TERM GOAL #5   Title Improve FOTO to </= 39% limitation 11/30/15   Time 6   Period Weeks   Status On-going  49% limited               Plan - 11/18/15 1028    Clinical Impression Statement Pt's bilateral knee ROM and bilateral hip strength has improved since initiating therapy. Pt continues to have symptoms (pain and crepitus) with WB exercises.  Pt has partially met goals at this time.  Pt wishes to hold therapy until MD visit next week.     Rehab Potential Good   PT Frequency 2x / week   PT Duration 6 weeks   PT Treatment/Interventions Patient/family education;ADLs/Self Care Home Management;Cryotherapy;Electrical Stimulation;Iontophoresis 55m/ml Dexamethasone;Moist Heat;Ultrasound;Neuromuscular re-education;Manual techniques;Dry needling;Therapeutic activities;Therapeutic exercise   PT Next Visit Plan Hold therapy per pt request and await further instruction from referring MD.     Consulted and Agree with Plan of Care Patient      Patient will benefit from skilled therapeutic intervention in order to improve the following deficits and impairments:  Postural dysfunction, Improper body mechanics, Pain, Decreased range of motion, Decreased mobility, Decreased strength, Decreased endurance, Decreased activity  tolerance  Visit Diagnosis: Pain in right knee  Pain in left knee  Other symptoms and signs involving the musculoskeletal system     Problem List Patient Active Problem List   Diagnosis Date Noted  . Left knee pain 09/29/2015  . PCOS (polycystic ovarian syndrome)  05/31/2015  . Abnormal weight gain 03/10/2015  . Abnormal EEG 01/02/2012  . ADD (attention deficit disorder) 01/02/2012  . Anxiety 01/02/2012  . Clinical depression 01/02/2012  . Current tobacco use 01/02/2012   Kerin Perna, PTA 11/18/2015 10:32 AM  [place], [place], [address] Phone: [phone]   Fax:  [phone]  Name: Ariana Parks MRN: [id number] Date of Birth: 1981/05/04

## 2015-12-07 ENCOUNTER — Ambulatory Visit (INDEPENDENT_AMBULATORY_CARE_PROVIDER_SITE_OTHER): Payer: BLUE CROSS/BLUE SHIELD | Admitting: Family Medicine

## 2015-12-07 ENCOUNTER — Encounter: Payer: Self-pay | Admitting: Family Medicine

## 2015-12-07 VITALS — BP 142/84 | HR 73 | Wt 314.0 lb

## 2015-12-07 DIAGNOSIS — F419 Anxiety disorder, unspecified: Secondary | ICD-10-CM

## 2015-12-07 DIAGNOSIS — S060X0A Concussion without loss of consciousness, initial encounter: Secondary | ICD-10-CM
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
## 2015-12-07 NOTE — Patient Instructions (Signed)
Thank you for coming in today. Return in 1 week.   Concussion, Adult A concussion, or closed-head injury, is a brain injury caused by a direct blow to the head or by a quick and sudden movement (jolt) of the head or neck. Concussions are usually not life-threatening. Even so, the effects of a concussion can be serious. If you have had a concussion before, you are more likely to experience concussion-like symptoms after a direct blow to the head.  CAUSES  Direct blow to the head, such as from running into another player during a soccer game, being hit in a fight, or hitting your head on a hard surface.  A jolt of the head or neck that causes the brain to move back and forth inside the skull, such as in a car crash. SIGNS AND SYMPTOMS The signs of a concussion can be hard to notice. Early on, they may be missed by you, family members, and health care providers. You may look fine but act or feel differently. Symptoms are usually temporary, but they may last for days, weeks, or even longer. Some symptoms may appear right away while others may not show up for hours or days. Every head injury is different. Symptoms include:  Mild to moderate headaches that will not go away.  A feeling of pressure inside your head.  Having more trouble than usual:  Learning or remembering things you have heard.  Answering questions.  Paying attention or concentrating.  Organizing daily tasks.  Making decisions and solving problems.  Slowness in thinking, acting or reacting, speaking, or reading.  Getting lost or being easily confused.  Feeling tired all the time or lacking energy (fatigued).  Feeling drowsy.  Sleep disturbances.  Sleeping more than usual.  Sleeping less than usual.  Trouble falling asleep.  Trouble sleeping (insomnia).  Loss of balance or feeling lightheaded or dizzy.  Nausea or vomiting.  Numbness or tingling.  Increased sensitivity  to:  Sounds.  Lights.  Distractions.  Vision problems or eyes that tire easily.  Diminished sense of taste or smell.  Ringing in the ears.  Mood changes such as feeling sad or anxious.  Becoming easily irritated or angry for little or no reason.  Lack of motivation.  Seeing or hearing things other people do not see or hear (hallucinations). DIAGNOSIS Your health care provider can usually diagnose a concussion based on a description of your injury and symptoms. He or she will ask whether you passed out (lost consciousness) and whether you are having trouble remembering events that happened right before and during your injury. Your evaluation might include:  A brain scan to look for signs of injury to the brain. Even if the test shows no injury, you may still have a concussion.  Blood tests to be sure other problems are not present. TREATMENT  Concussions are usually treated in an emergency department, in urgent care, or at a clinic. You may need to stay in the hospital overnight for further treatment.  Tell your health care provider if you are taking any medicines, including prescription medicines, over-the-counter medicines, and natural remedies. Some medicines, such as blood thinners (anticoagulants) and aspirin, may increase the chance of complications. Also tell your health care provider whether you have had alcohol or are taking illegal drugs. This information may affect treatment.  Your health care provider will send you home with important instructions to follow.  How fast you will recover from a concussion depends on many factors. These factors include  how severe your concussion is, what part of your brain was injured, your age, and how healthy you were before the concussion.  Most people with mild injuries recover fully. Recovery can take time. In general, recovery is slower in older persons. Also, persons who have had a concussion in the past or have other medical  problems may find that it takes longer to recover from their current injury. HOME CARE INSTRUCTIONS General Instructions  Carefully follow the directions your health care provider gave you.  Only take over-the-counter or prescription medicines for pain, discomfort, or fever as directed by your health care provider.  Take only those medicines that your health care provider has approved.  Do not drink alcohol until your health care provider says you are well enough to do so. Alcohol and certain other drugs may slow your recovery and can put you at risk of further injury.  If it is harder than usual to remember things, write them down.  If you are easily distracted, try to do one thing at a time. For example, do not try to watch TV while fixing dinner.  Talk with family members or close friends when making important decisions.  Keep all follow-up appointments. Repeated evaluation of your symptoms is recommended for your recovery.  Watch your symptoms and tell others to do the same. Complications sometimes occur after a concussion. Older adults with a brain injury may have a higher risk of serious complications, such as a blood clot on the brain.  Tell your teachers, school nurse, school counselor, coach, athletic trainer, or work Freight forwarder about your injury, symptoms, and restrictions. Tell them about what you can or cannot do. They should watch for:  Increased problems with attention or concentration.  Increased difficulty remembering or learning new information.  Increased time needed to complete tasks or assignments.  Increased irritability or decreased ability to cope with stress.  Increased symptoms.  Rest. Rest helps the brain to heal. Make sure you:  Get plenty of sleep at night. Avoid staying up late at night.  Keep the same bedtime hours on weekends and weekdays.  Rest during the day. Take daytime naps or rest breaks when you feel tired.  Limit activities that require a  lot of thought or concentration. These include:  Doing homework or job-related work.  Watching TV.  Working on the computer.  Avoid any situation where there is potential for another head injury (football, hockey, soccer, basketball, martial arts, downhill snow sports and horseback riding). Your condition will get worse every time you experience a concussion. You should avoid these activities until you are evaluated by the appropriate follow-up health care providers. Returning To Your Regular Activities You will need to return to your normal activities slowly, not all at once. You must give your body and brain enough time for recovery.  Do not return to sports or other athletic activities until your health care provider tells you it is safe to do so.  Ask your health care provider when you can drive, ride a bicycle, or operate heavy machinery. Your ability to react may be slower after a brain injury. Never do these activities if you are dizzy.  Ask your health care provider about when you can return to work or school. Preventing Another Concussion It is very important to avoid another brain injury, especially before you have recovered. In rare cases, another injury can lead to permanent brain damage, brain swelling, or death. The risk of this is greatest during the first  7-10 days after a head injury. Avoid injuries by:  Wearing a seat belt when riding in a car.  Drinking alcohol only in moderation.  Wearing a helmet when biking, skiing, skateboarding, skating, or doing similar activities.  Avoiding activities that could lead to a second concussion, such as contact or recreational sports, until your health care provider says it is okay.  Taking safety measures in your home.  Remove clutter and tripping hazards from floors and stairways.  Use grab bars in bathrooms and handrails by stairs.  Place non-slip mats on floors and in bathtubs.  Improve lighting in dim areas. SEEK MEDICAL  CARE IF:  You have increased problems paying attention or concentrating.  You have increased difficulty remembering or learning new information.  You need more time to complete tasks or assignments than before.  You have increased irritability or decreased ability to cope with stress.  You have more symptoms than before. Seek medical care if you have any of the following symptoms for more than 2 weeks after your injury:  Lasting (chronic) headaches.  Dizziness or balance problems.  Nausea.  Vision problems.  Increased sensitivity to noise or light.  Depression or mood swings.  Anxiety or irritability.  Memory problems.  Difficulty concentrating or paying attention.  Sleep problems.  Feeling tired all the time. SEEK IMMEDIATE MEDICAL CARE IF:  You have severe or worsening headaches. These may be a sign of a blood clot in the brain.  You have weakness (even if only in one hand, leg, or part of the face).  You have numbness.  You have decreased coordination.  You vomit repeatedly.  You have increased sleepiness.  One pupil is larger than the other.  You have convulsions.  You have slurred speech.  You have increased confusion. This may be a sign of a blood clot in the brain.  You have increased restlessness, agitation, or irritability.  You are unable to recognize people or places.  You have neck pain.  It is difficult to wake you up.  You have unusual behavior changes.  You lose consciousness. MAKE SURE YOU:  Understand these instructions.  Will watch your condition.  Will get help right away if you are not doing well or get worse.   This information is not intended to replace advice given to you by your health care provider. Make sure you discuss any questions you have with your health care provider.   Document Released: 09/01/2003 Document Revised: 07/02/2014 Document Reviewed: 01/01/2013 Elsevier Interactive Patient Education [employer]

## 2015-12-14 ENCOUNTER — Ambulatory Visit (INDEPENDENT_AMBULATORY_CARE_PROVIDER_SITE_OTHER): Payer: BLUE CROSS/BLUE SHIELD | Admitting: Family Medicine

## 2015-12-14 DIAGNOSIS — Z114 Encounter for screening for human immunodeficiency virus [HIV]: Secondary | ICD-10-CM

## 2015-12-14 DIAGNOSIS — S060X0A Concussion without loss of consciousness, initial encounter: Secondary | ICD-10-CM | POA: Diagnosis not present

## 2015-12-14 DIAGNOSIS — Z13220 Encounter for screening for lipoid disorders: Secondary | ICD-10-CM

## 2015-12-14 DIAGNOSIS — E282 Polycystic ovarian syndrome: Secondary | ICD-10-CM | POA: Diagnosis not present

## 2015-12-14 DIAGNOSIS — F419 Anxiety disorder, unspecified: Secondary | ICD-10-CM

## 2015-12-14 MED ORDER — VILAZODONE HCL 10 MG PO TABS: 15.0000 mg | ORAL_TABLET | Freq: Every day | ORAL | Status: DC

## 2015-12-14 NOTE — Patient Instructions (Signed)
Thank you for coming in today. Get fasting labs in the future.  Recheck in 1 month.  We should have a family meeting with everyone and your grandmother on the last visit on a Friday morning so we have time to discuss her options.

## 2015-12-14 NOTE — Progress Notes (Signed)
       Ariana Parks is a 35 y.o. female who presents to [facility] today for concussion anxiety and depression. Patient is feeling much better from her concussion symptoms. She feels less foggy. She has improved headaches. No SI or HI expressed. She notes her anxiety and depression is also improving with Viibryd.   Past Medical History  Diagnosis Date  . GERD (gastroesophageal reflux disease)   . Panic attack   . High cholesterol   . Depression   . PCOS (polycystic ovarian syndrome)   . Abnormal Pap smear of cervix 2005    Mod dysplasia   Past Surgical History  Procedure Laterality Date  . Tonsillectomy    . Knee surgery    . Wisdom tooth extraction    . Leep     Social History  Substance Use Topics  . Smoking status: Current Every Day Smoker -- 0.50 packs/day for 15 years    Types: Cigarettes  . Smokeless tobacco: Never Used     Comment: using vapes  . Alcohol Use: 0.0 oz/week    0 Standard drinks or equivalent per week   family history includes GER disease in her father; Heart attack in her other; Hypertension in her mother; Migraines in her father; Thyroid disease in her brother and mother.  ROS as above:  Medications: Current Outpatient Prescriptions  Medication Sig Dispense Refill  . Calcium Carbonate Antacid (ANTACID PO) Take by mouth.    . medroxyPROGESTERone (PROVERA) 10 MG tablet Take 1 tablet (10 mg total) by mouth daily. Use for ten days 10 tablet 12  . methocarbamol (ROBAXIN) 500 MG tablet Take 500 mg by mouth 4 (four) times daily.    . ranitidine (ZANTAC) 150 MG capsule Take 150 mg by mouth.    . Vilazodone HCl (VIIBRYD) 10 MG TABS Take 1.5 tablets (15 mg total) by mouth daily. 45 tablet 12   No current facility-administered medications for this visit.   Allergies  Allergen Reactions  . Celexa [Citalopram Hydrobromide]   . Ibuprofen     High doses  . Prednisone   . Prilosec [Omeprazole]   . Wellbutrin [Bupropion]   . Flexeril [Cyclobenzaprine] Other (See Comments)    Dreams     Exam:  There were no vitals taken for this visit. Gen: Well NAD HEENT: EOMI,  MMM Lungs: Normal work of breathing. CTABL Heart: RRR no MRG Abd: NABS, Soft. Nondistended, Nontender Exts: Brisk capillary refill, warm and well perfused.  Psych: Alert and oriented normal speech thought process and affect. Neuro: Alert and oriented normal gait and balance and strength.  PHQ9 is 14 GAD 7 is 11   No results found for this or any previous visit (from the past 24 hour(s)). No results found.    Assessment and Plan: 35 y.o. female with  1) concussion: Improving. Continue cognitive rest recheck in a month or so  2) anxiety and depression: Improving. Continue Viibryd.  Discussed warning signs or symptoms. Please see discharge instructions. Patient expresses understanding.

## 2015-12-28 ENCOUNTER — Ambulatory Visit (INDEPENDENT_AMBULATORY_CARE_PROVIDER_SITE_OTHER): Payer: BLUE CROSS/BLUE SHIELD | Admitting: Rehabilitative and Restorative Service Providers"

## 2015-12-28 ENCOUNTER — Encounter: Payer: Self-pay | Admitting: Rehabilitative and Restorative Service Providers"

## 2015-12-28 DIAGNOSIS — M25562 Pain in left knee: Secondary | ICD-10-CM

## 2015-12-28 DIAGNOSIS — M25561 Pain in right knee: Secondary | ICD-10-CM | POA: Diagnosis not present

## 2015-12-28 DIAGNOSIS — R29898 Other symptoms and signs involving the musculoskeletal system: Secondary | ICD-10-CM | POA: Diagnosis not present

## 2015-12-28 NOTE — Therapy (Signed)
[facility] [place], [place], [address] Phone: [phone]   Fax:  [phone]  Physical Therapy Treatment  Patient Details  Name: Ariana Parks MRN: [id number] Date of Birth: 07-Aug-1980 Referring Provider: Dr. Theda Sers   Encounter Date: 12/28/2015      PT End of Session - 12/28/15 1612    Visit Number 1   Number of Visits 12   Date for PT Re-Evaluation 02/09/16   PT Start Time [id number]   PT Stop Time 1642   PT Time Calculation (min) 53 min   Activity Tolerance Patient tolerated treatment well      Past Medical History  Diagnosis Date  . GERD (gastroesophageal reflux disease)   . Panic attack   . High cholesterol   . Depression   . PCOS (polycystic ovarian syndrome)   . Abnormal Pap smear of cervix 2005    Mod dysplasia    Past Surgical History  Procedure Laterality Date  . Tonsillectomy    . Knee surgery    . Wisdom tooth extraction    . Leep      There were no vitals filed for this visit.      Subjective Assessment - 12/28/15 1550    Subjective Patient reports that she was seen by Dr. Theda Sers earlier in June and PA stopped therapy until after MRI which showed osteoarthritis in bilat knees. She returned to Dr. Theda Sers and he wants her to come for PT so she can get comfortable with her HEP. She does not feel that the taping helps. She still has her braces which help some. [name] reports that the Lt knee is swollen and hurts today.    Pertinent History lateral release bilat knees in early 2000's with no significant improvement. cervical and bilat shoulder dysfunction treated in PT 10/16 to 2/17; fx of coccyx in middle school   How long can you sit comfortably? 15-20 min    How long can you stand comfortably? 10-20 min    How long can you walk comfortably? level surfaces - 20 min    Diagnostic tests xrays MRI    Patient Stated Goals get rid of the knee pain and get her knees to cooporate with  the rest of her body    Currently in Pain? Yes   Pain Score 1    Pain Location Knee   Pain Orientation Right   Pain Descriptors / Indicators Tightness   Pain Type Chronic pain   Pain Onset More than a month ago   Pain Frequency Intermittent   Pain Score 4   Pain Location Knee   Pain Orientation Left   Pain Descriptors / Indicators Tightness;Aching   Pain Type Chronic pain   Pain Onset More than a month ago   Pain Frequency Intermittent            [facility] PT Assessment - 12/28/15 0001    Assessment   Medical Diagnosis Bilat knee pain/chondromalacia   Referring Provider Dr. Theda Sers    Onset Date/Surgical Date 06/26/15   Hand Dominance Left   Next MD Visit 01/30/16   Precautions   Precautions None   Required Braces or Orthoses --  bilat knee braces prn for pain and stability    Balance Screen   Has the patient fallen in the past 6 months No   Has the patient had a decrease in activity level because of a fear of falling?  No   Is the patient reluctant to leave  their home because of a fear of falling?  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   Additional Comments single level home - difficulty with stairs    Prior Function   Level of Independence Independent   Vocation Part time employment  returned to work for ~ 1 week/"not in the system"   Vocation Requirements [employer], lift up to 25#   Leisure cares for brother   Observation/Other Assessments   Focus on Therapeutic Outcomes (FOTO)  66% limitation    Sensation   Additional Comments WFL's per pt report    AROM   Right/Left Hip --  limited by patient's weight/size   Right Knee Extension -9   Right Knee Flexion 131   Left Knee Extension -2   Left Knee Flexion 116   Strength   Right/Left Hip Right;Left   Right Hip Flexion --  5-/5, with discomfort   Right Hip Extension 5/5   Right Hip ABduction 4+/5   Right Hip ADduction 5/5   Left Hip Flexion --  5-/5   Left Hip Extension --  5-/5   Left  Hip ABduction --  5-/5   Left Hip ADduction --  5-/5   Right Knee Flexion 5/5   Right Knee Extension 5/5   Left Knee Flexion 4+/5  painful   Left Knee Extension 4/5  painful    Flexibility   Hamstrings tight bilat `80 deg    Quadriceps knee flex prone heel to buttock Rt 120 knee flexion; Lt 112 knee flex    ITB tight Rt > Lt    Piriformis tight bilat    Palpation   Patella mobility hypermobility noted bilat; lateral tracking bilat                       [facility] Adult PT Treatment/Exercise - 12/28/15 0001    Knee/Hip Exercises: Stretches   Passive Hamstring Stretch Right;Left;2 reps;30 seconds  supine with strap    Gastroc Stretch Right;Left;2 reps;30 seconds   Knee/Hip Exercises: Supine   Quad Sets Right;Left;1 set;10 reps  5 sec hold    Hip Adduction Isometric Strengthening;Both;1 set;10 reps  5 sec hold   Bridges 15 reps;1 set  5 sec hold    Straight Leg Raise with External Rotation Strengthening;Right;Left;2 sets;10 reps   Knee/Hip Exercises: Sidelying   Hip ABduction Strengthening;Right;Left;1 set;10 reps   Cryotherapy   Number Minutes Cryotherapy 12 Minutes   Cryotherapy Location Knee  bilat   Type of Cryotherapy Ice pack                PT Education - 12/28/15 1635    Education provided Yes   Education Details HEP    Person(s) Educated Patient   Methods Explanation;Verbal cues;Handout   Comprehension Verbalized understanding;Returned demonstration;Verbal cues required             PT Long Term Goals - 12/28/15 1613    PT LONG TERM GOAL #1   Title Improve strength bilat LE's to 5-/5 to 5/5 02/09/16   Time 6   Period Weeks   Status Revised   PT LONG TERM GOAL #2   Title Increase knee flexion to 130 deg bilat and extension to (-)5 deg bilat 02/09/16   Time 6   Status Revised   PT LONG TERM GOAL #3   Title Improve patellar tracking with patient demonstrating less pain with functional activities 02/09/16   Time 6   Period Weeks  Status Revised   PT LONG TERM GOAL #4   Title Independent in HEP 02/09/16   Time 6   Period Weeks   Status Revised   PT LONG TERM GOAL #5   Title Improve FOTO to </= 46% limitation 02/09/16   Time 6   Period Weeks   Status Revised               Plan - 12/28/15 1636    Clinical Impression Statement [name] returns with chronic bilater knee pain. MRI shows osteoarthritis in bilateral knees, She presents clinically with decreased knee ROM; decreased LE strength; edema at knees; limited functional activity level related to the knee pain. Patient will benefit from PT to establish a HEP and improve functional activity level.    Rehab Potential Good   PT Frequency 2x / week   PT Duration 6 weeks   PT Treatment/Interventions Patient/family education;ADLs/Self Care Home Management;Cryotherapy;Electrical Stimulation;Iontophoresis 4mg /ml Dexamethasone;Moist Heat;Ultrasound;Neuromuscular re-education;Manual techniques;Dry needling;Therapeutic activities;Therapeutic exercise   PT Next Visit Plan LE ROM/strengthening; instruction in HEP - encourage consistent home program    Consulted and Agree with Plan of Care Patient      Patient will benefit from skilled therapeutic intervention in order to improve the following deficits and impairments:  Postural dysfunction, Improper body mechanics, Pain, Decreased range of motion, Decreased mobility, Decreased strength, Decreased endurance, Decreased activity tolerance  Visit Diagnosis: Pain in right knee - Plan: PT plan of care cert/re-cert  Pain in left knee - Plan: PT plan of care cert/re-cert  Other symptoms and signs involving the musculoskeletal system - Plan: PT plan of care cert/re-cert     Problem List Patient Active Problem List   Diagnosis Date Noted  . Concussion without loss of consciousness 12/07/2015  . Left knee pain 09/29/2015  . PCOS (polycystic ovarian syndrome) 05/31/2015  . Abnormal weight gain 03/10/2015  . Abnormal EEG  01/02/2012  . ADD (attention deficit disorder) 01/02/2012  . Anxiety 01/02/2012  . Clinical depression 01/02/2012  . Current tobacco use 01/02/2012    Celyn Nilda Simmer PT, MPH  12/28/2015, 4:46 PM  [facility] [place] [place], [place], [address] Phone: [phone]   Fax:  [phone]  Name: Raevyn Fenzel MRN: [id number] Date of Birth: 1980/12/25

## 2015-12-28 NOTE — Patient Instructions (Signed)
HIP: Hamstrings - Supine    Place strap around foot. Raise leg up, keep knee straight. Hold _30__ seconds. _3__ reps per set, __2_ sets per day  Quad Set    With other leg bent, foot flat, slowly tighten muscles on thigh of straight leg while counting out loud to _10___. Repeat with other leg. Repeat _10___ times. Do _1___ sessions per day.   Strengthening: Hip Adduction - Isometric    With ball or folded pillow between knees, squeeze knees together. Hold ___10_ seconds. Repeat __10__ times per set. Do _1-3___ sets per session. Do __1__ sessions per day.    Straight Leg Raise: With External Leg Rotation    Lie on back with right leg straight, opposite leg bent. Rotate straight leg out and lift _10___ inches. Repeat _10___ times per set. Do __1-3__ sets per session. Do _1___ sessions per day.   Strengthening: Hip Abduction (Side-Lying)    Tighten muscles on front of left thigh, then lift leg __10__ inches from surface, keeping knee locked.  Repeat ___10_ times per set. Do _1-3___ sets per session. Do __1__ sessions per day.   Achilles / Gastroc, Standing    Stand, right foot behind, heel on floor and turned slightly out, leg straight, forward leg bent. Move hips forward. Hold _30__ seconds. Repeat _3__ times per session. Do __1-2_ sessions per day.

## 2015-12-30 ENCOUNTER — Ambulatory Visit (INDEPENDENT_AMBULATORY_CARE_PROVIDER_SITE_OTHER): Payer: BLUE CROSS/BLUE SHIELD | Admitting: Physical Therapy

## 2015-12-30 DIAGNOSIS — R29898 Other symptoms and signs involving the musculoskeletal system: Secondary | ICD-10-CM

## 2015-12-30 DIAGNOSIS — M25561 Pain in right knee: Secondary | ICD-10-CM

## 2015-12-30 DIAGNOSIS — M25562 Pain in left knee: Secondary | ICD-10-CM | POA: Diagnosis not present
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
## 2016-01-03 ENCOUNTER — Encounter: Payer: Self-pay | Admitting: Physical Therapy

## 2016-01-06 ENCOUNTER — Encounter: Payer: Self-pay | Admitting: Rehabilitative and Restorative Service Providers"

## 2016-01-06 ENCOUNTER — Ambulatory Visit (INDEPENDENT_AMBULATORY_CARE_PROVIDER_SITE_OTHER): Payer: BLUE CROSS/BLUE SHIELD | Admitting: Rehabilitative and Restorative Service Providers"

## 2016-01-06 DIAGNOSIS — R29898 Other symptoms and signs involving the musculoskeletal system: Secondary | ICD-10-CM | POA: Diagnosis not present

## 2016-01-06 DIAGNOSIS — M25561 Pain in right knee: Secondary | ICD-10-CM

## 2016-01-06 DIAGNOSIS — M25562 Pain in left knee: Secondary | ICD-10-CM | POA: Diagnosis not present

## 2016-01-06 DIAGNOSIS — R293 Abnormal posture: Secondary | ICD-10-CM | POA: Diagnosis not present

## 2016-01-06 DIAGNOSIS — M6281 Muscle weakness (generalized): Secondary | ICD-10-CM

## 2016-01-06 NOTE — Therapy (Signed)
[place], [place], [address] Phone: [phone]   Fax:  [phone]  Physical Therapy Treatment  Patient Details  Name: Ariana Parks MRN: [id number] Date of Birth: 1981/02/10 Referring Provider: Dr. Theda Sers   Encounter Date: 01/06/2016      PT End of Session - 01/06/16 1428    Visit Number 3   Number of Visits 12   Date for PT Re-Evaluation 02/09/16   PT Start Time 1400   PT Stop Time 1455   PT Time Calculation (min) 55 min   Activity Tolerance Patient tolerated treatment well;Patient limited by pain      Past Medical History  Diagnosis Date  . GERD (gastroesophageal reflux disease)   . Panic attack   . High cholesterol   . Depression   . PCOS (polycystic ovarian syndrome)   . Abnormal Pap smear of cervix 2005    Mod dysplasia    Past Surgical History  Procedure Laterality Date  . Tonsillectomy    . Knee surgery    . Wisdom tooth extraction    . Leep      There were no vitals filed for this visit.      Subjective Assessment - 01/06/16 1431    Subjective Increased pain in both knees today. Lt is always worse than right. Vaso helped Lt knee "so much" last time. Swelling and pain were better for about a day. Doing exercises at home 1-2 times a day.    Currently in Pain? Yes   Pain Score 3    Pain Location Knee   Pain Orientation Right   Pain Descriptors / Indicators Tightness   Pain Type Chronic pain   Pain Onset More than a month ago   Pain Frequency Intermittent   Pain Score 5   Pain Location Knee   Pain Orientation Left   Pain Descriptors / Indicators Aching;Sharp;Tightness   Pain Type Chronic pain   Pain Onset More than a month ago   Pain Frequency Intermittent                         [facility] Adult PT Treatment/Exercise - 01/06/16 0001    Knee/Hip Exercises: Stretches   Passive Hamstring Stretch Right;Left;2 reps;30 seconds  supine with strap    Quad Stretch Right;Left;3 reps  45 sec   ITB Stretch Right;Left;2 reps;30 seconds   Gastroc Stretch Right;Left;2 reps;30 seconds   Soleus Stretch Right;Left;2 reps;30 seconds   Knee/Hip Exercises: Aerobic   Nustep L4: 5 min    Knee/Hip Exercises: Supine   Quad Sets Right;Left;1 set;10 reps   Bridges 1 set;20 reps  5 sec hold, with ball squeeze     Straight Leg Raise with External Rotation Strengthening;Right;Left;2 sets;10 reps   Knee/Hip Exercises: Sidelying   Hip ABduction Strengthening;Right;Left;10 reps;2 sets  toes up, toes down.    Hip ADduction Strengthening;Right;Left;2 sets;10 reps   Clams 2 sets of 10 Rt/Lt    Knee/Hip Exercises: Prone   Straight Leg Raises Strengthening;Right;Left;2 sets;10 reps  glut sets 10 sec hold x 10 prior to hip extension    Modalities   Modalities Vasopneumatic;Cryotherapy   Cryotherapy   Number Minutes Cryotherapy 15 Minutes   Cryotherapy Location Knee  Rt   Type of Cryotherapy Ice pack   Vasopneumatic   Number Minutes Vasopneumatic  15 minutes   Vasopnuematic Location  Knee  Lt   Vasopneumatic Pressure Low   Vasopneumatic Temperature  3*  PT Long Term Goals - 01/06/16 1431    PT LONG TERM GOAL #1   Title Improve strength bilat LE's to 5-/5 to 5/5 02/09/16   Time 6   Period Weeks   Status On-going   PT LONG TERM GOAL #2   Title Increase knee flexion to 130 deg bilat and extension to (-)5 deg bilat 02/09/16   Time 6   Period Weeks   Status On-going   PT LONG TERM GOAL #3   Title Improve patellar tracking with patient demonstrating less pain with functional activities 02/09/16   Time 6   Period Weeks   Status On-going   PT LONG TERM GOAL #4   Title Independent in HEP 02/09/16   Time 6   Period Weeks   Status On-going   PT LONG TERM GOAL #5   Title Improve FOTO to </= 46% limitation 02/09/16   Time 6   Period Weeks   Status On-going               Plan - 01/06/16 1429    Clinical  Impression Statement tolerated exercses wotu c/o of increaed pain today. Cryotherapy/vaso helps to decrease pina post exercise.    Rehab Potential Good   PT Frequency 2x / week   PT Duration 6 weeks   PT Treatment/Interventions Patient/family education;ADLs/Self Care Home Management;Cryotherapy;Electrical Stimulation;Iontophoresis 4mg /ml Dexamethasone;Moist Heat;Ultrasound;Neuromuscular re-education;Manual techniques;Dry needling;Therapeutic activities;Therapeutic exercise   PT Next Visit Plan LE ROM/strengthening; instruction in HEP - continued encouragement for consistent home program . assess response to vaso for Lt knee.    PT Home Exercise Plan pt to try heat on knees for pain relief.    Consulted and Agree with Plan of Care Patient      Patient will benefit from skilled therapeutic intervention in order to improve the following deficits and impairments:  Postural dysfunction, Improper body mechanics, Pain, Decreased range of motion, Decreased mobility, Decreased strength, Decreased endurance, Decreased activity tolerance  Visit Diagnosis: Pain in right knee  Pain in left knee  Other symptoms and signs involving the musculoskeletal system  Abnormal posture  Muscle weakness (generalized)     Problem List Patient Active Problem List   Diagnosis Date Noted  . Concussion without loss of consciousness 12/07/2015  . Left knee pain 09/29/2015  . PCOS (polycystic ovarian syndrome) 05/31/2015  . Abnormal weight gain 03/10/2015  . Abnormal EEG 01/02/2012  . ADD (attention deficit disorder) 01/02/2012  . Anxiety 01/02/2012  . Clinical depression 01/02/2012  . Current tobacco use 01/02/2012    Timmey Lamba Nilda Simmer PT, MPH 01/06/2016, 2:47 PM  [facility] [place], [place], [address] Phone: [phone]   Fax:  [phone]  Name: Ariana Parks MRN: [id number] Date of Birth: [birth year]

## 2016-01-09 ENCOUNTER — Encounter: Payer: Self-pay | Admitting: Physical Therapy

## 2016-01-11 ENCOUNTER — Ambulatory Visit (INDEPENDENT_AMBULATORY_CARE_PROVIDER_SITE_OTHER): Payer: BLUE CROSS/BLUE SHIELD | Admitting: Family Medicine

## 2016-01-11 ENCOUNTER — Encounter: Payer: BLUE CROSS/BLUE SHIELD | Admitting: Physical Therapy

## 2016-01-11 ENCOUNTER — Encounter: Payer: Self-pay | Admitting: Family Medicine

## 2016-01-11 VITALS — BP 130/83 | HR 93 | Wt 318.0 lb

## 2016-01-11 DIAGNOSIS — F329 Major depressive disorder, single episode, unspecified: Secondary | ICD-10-CM | POA: Diagnosis not present

## 2016-01-11 DIAGNOSIS — F419 Anxiety disorder, unspecified: Secondary | ICD-10-CM

## 2016-01-11 DIAGNOSIS — F32A Depression, unspecified: Secondary | ICD-10-CM

## 2016-01-11 MED ORDER — VILAZODONE HCL 20 MG PO TABS: 20.0000 mg | ORAL_TABLET | Freq: Every day | ORAL | Status: DC

## 2016-01-11 NOTE — Progress Notes (Signed)
Ariana Parks is a 35 y.o. female who presents to [place]: [place] today for follow-up anxiety and depression. Patient notes slight improvement of her anxiety and depression symptoms. She is doing well with Viibryd 15 mg daily and wants to increase to 20 mg. She notes a significant Life stressor. Her grandmother is ill and demented and currently living with her family. This is a huge stress on everyone. The plan is to move her grandmother to assisted living facility in the near future. Ariana Parks notes that she has conflicted about this plan.   She feels well otherwise.   Past Medical History  Diagnosis Date  . GERD (gastroesophageal reflux disease)   . Panic attack   . High cholesterol   . Depression   . PCOS (polycystic ovarian syndrome)   . Abnormal Pap smear of cervix 2005    Mod dysplasia   Past Surgical History  Procedure Laterality Date  . Tonsillectomy    . Knee surgery    . Wisdom tooth extraction    . Leep     Social History  Substance Use Topics  . Smoking status: Current Every Day Smoker -- 0.50 packs/day for 15 years    Types: Cigarettes  . Smokeless tobacco: Never Used     Comment: using vapes  . Alcohol Use: 0.0 oz/week    0 Standard drinks or equivalent per week   family history includes GER disease in her father; Heart attack in her other; Hypertension in her mother; Migraines in her father; Thyroid disease in her brother and mother.  ROS as above:  Medications: Current Outpatient Prescriptions  Medication Sig Dispense Refill  . Calcium Carbonate Antacid (ANTACID PO) Take by mouth.    . medroxyPROGESTERone (PROVERA) 10 MG tablet Take 1 tablet (10 mg total) by mouth daily. Use for ten days 10 tablet 12  . methocarbamol (ROBAXIN) 500 MG tablet Take 500 mg by mouth 4 (four) times daily.    . ranitidine (ZANTAC) 150 MG capsule Take 150 mg by  mouth.    . Vilazodone HCl (VIIBRYD) 20 MG TABS Take 1 tablet (20 mg total) by mouth daily. 30 tablet 2   No current facility-administered medications for this visit.   Allergies  Allergen Reactions  . Celexa [Citalopram Hydrobromide]   . Ibuprofen     High doses  . Prednisone   . Prilosec [Omeprazole]   . Wellbutrin [Bupropion]   . Flexeril [Cyclobenzaprine] Other (See Comments)    Dreams     Exam:  BP 130/83 mmHg  Pulse 93  Wt 318 lb (144.244 kg) Gen: Well NAD Psych: Alert and oriented normal speech thought process. Affect is tearful at times.  Depression screen [facility] 2/9 01/11/2016 12/14/2015  Decreased Interest 1 1  Down, Depressed, Hopeless 2 2  PHQ - 2 Score 3 3  Altered sleeping 2 2  Tired, decreased energy 1 2  Change in appetite 1 2  Feeling bad or failure about yourself  1 1  Trouble concentrating 1 1  Moving slowly or fidgety/restless 1 2  Suicidal thoughts 1 1  PHQ-9 Score 11 14  Difficult doing work/chores Somewhat difficult Somewhat difficult    GAD 7 : Generalized Anxiety Score 01/11/2016 12/14/2015  Nervous, Anxious, on Edge 2 2  Control/stop worrying 1 2  Worry too much - different things 1 1  Trouble relaxing 1 1  Restless 1 2  Easily annoyed or irritable 1 1  Afraid -  awful might happen 2 2  Total GAD 7 Score 9 11  Anxiety Difficulty Somewhat difficult Somewhat difficult       No results found for this or any previous visit (from the past 24 hour(s)). No results found.    Assessment and Plan: 35 y.o. female with improving anxiety depression symptoms with new life Stressors. Increase Viibryd to 20 mg. Recheck in one month.   Discussed warning signs or symptoms. Please see discharge instructions. Patient expresses understanding.

## 2016-01-11 NOTE — Patient Instructions (Addendum)
Thank you for coming in today. Return in 1 month.  Get labs.  Increase Viibryd to 20 mg daily.

## 2016-01-12 ENCOUNTER — Ambulatory Visit (INDEPENDENT_AMBULATORY_CARE_PROVIDER_SITE_OTHER): Payer: BLUE CROSS/BLUE SHIELD | Admitting: Physical Therapy

## 2016-01-12 DIAGNOSIS — R293 Abnormal posture: Secondary | ICD-10-CM | POA: Diagnosis not present

## 2016-01-12 DIAGNOSIS — M25561 Pain in right knee: Secondary | ICD-10-CM

## 2016-01-12 DIAGNOSIS — M25562 Pain in left knee: Secondary | ICD-10-CM | POA: Diagnosis not present

## 2016-01-12 DIAGNOSIS — R29898 Other symptoms and signs involving the musculoskeletal system: Secondary | ICD-10-CM

## 2016-01-12 NOTE — Therapy (Addendum)
[place], [place], [address] Phone: [phone]   Fax:  [phone]  Physical Therapy Treatment  Patient Details  Name: Ariana Parks MRN: [id number] Date of Birth: Jan 02, 1981 Referring Provider: Dr. Theda Sers   Encounter Date: 01/12/2016      PT End of Session - 01/12/16 1450    Visit Number 4   Number of Visits 12   Date for PT Re-Evaluation 02/09/16   PT Start Time 1448   PT Stop Time 1542   PT Time Calculation (min) 54 min      Past Medical History  Diagnosis Date  . GERD (gastroesophageal reflux disease)   . Panic attack   . High cholesterol   . Depression   . PCOS (polycystic ovarian syndrome)   . Abnormal Pap smear of cervix 2005    Mod dysplasia    Past Surgical History  Procedure Laterality Date  . Tonsillectomy    . Knee surgery    . Wisdom tooth extraction    . Leep      There were no vitals filed for this visit.      Subjective Assessment - 01/12/16 1450    Subjective Pt reports she has been stressed over family things (grandmother is moving to assisted living).  She missed last appt yesterday after eating cashews and having abdominal pain.     She states she has been doing the hamstring and ITB stretches and leg lifts.    Currently in Pain? Yes   Pain Score 5    Pain Location Knee   Pain Orientation Left;Right   Aggravating Factors  stairs, squatting    Pain Relieving Factors medicine, braces, ice.             [facility] PT Assessment - 01/12/16 0001    Assessment   Medical Diagnosis Bilat knee pain/chondromalacia   Referring Provider Dr. Theda Sers    Onset Date/Surgical Date 06/26/15   Hand Dominance Left   Next MD Visit 01/30/16   Prior Therapy after surgery in early 2000's    Precautions   Precautions None   AROM   Right Knee Flexion 135   Left Knee Flexion 123   Strength   Right Hip Flexion 5/5   Right Hip Extension 5/5   Right Hip ABduction 5/5   Left Hip Extension 5/5   Left Hip ABduction 5/5   Right Knee Flexion 5/5   Left Knee Flexion 4+/5           [facility] Adult PT Treatment/Exercise - 01/12/16 0001    Knee/Hip Exercises: Stretches   Passive Hamstring Stretch Right;Left;2 reps;30 seconds  supine with strap    Quad Stretch Right;Left;3 reps  45 sec   ITB Stretch Right;Left;2 reps;30 seconds   Gastroc Stretch Right;Left;2 reps;30 seconds   Other Knee/Hip Stretches butterfly adductor stretch x 45 sec   Knee/Hip Exercises: Aerobic   Nustep L4: 7 min    Knee/Hip Exercises: Standing   SLS Rt/Lt 30 sec; repeated with horizontal head turns with occasional UE support.    Knee/Hip Exercises: Seated   Long Arc Quad Left;1 set;10 reps  blue band   Hamstring Curl Strengthening;Left;1 set;10 reps  blue band   Knee/Hip Exercises: Supine   Bridges 1 set;20 reps  5 sec hold, with ball squeeze     Cryotherapy   Number Minutes Cryotherapy 15 Minutes   Cryotherapy Location Knee  Rt   Type of Cryotherapy Ice pack   Vasopneumatic  Number Minutes Vasopneumatic  15 minutes   Vasopnuematic Location  Knee  Lt   Vasopneumatic Pressure Medium   Vasopneumatic Temperature  3*   Manual Therapy   Manual Therapy Taping   Manual therapy comments Rock tape applied to lateral Lt knee to help decrease pain and edema   Kinesiotex Edema                PT Education - 01/12/16 1533    Education provided Yes   Education Details HEP   Person(s) Educated Patient   Methods Handout;Explanation   Comprehension Verbalized understanding;Returned demonstration             PT Long Term Goals - 01/06/16 1431    PT LONG TERM GOAL #1   Title Improve strength bilat LE's to 5-/5 to 5/5 02/09/16   Time 6   Period Weeks   Status On-going   PT LONG TERM GOAL #2   Title Increase knee flexion to 130 deg bilat and extension to (-)5 deg bilat 02/09/16   Time 6   Period Weeks   Status On-going   PT LONG TERM GOAL #3   Title Improve  patellar tracking with patient demonstrating less pain with functional activities 02/09/16   Time 6   Period Weeks   Status On-going   PT LONG TERM GOAL #4   Title Independent in HEP 02/09/16   Time 6   Period Weeks   Status On-going   PT LONG TERM GOAL #5   Title Improve FOTO to </= 46% limitation 02/09/16   Time 6   Period Weeks   Status On-going               Plan - 01/12/16 1617    Clinical Impression Statement Improving knee ROM and hip strength this visit. Pt reports decreased pain with use of vaso / ice at end of session.  Pt progressing towards goals.    Rehab Potential Good   PT Frequency 2x / week   PT Duration 6 weeks   PT Treatment/Interventions Patient/family education;ADLs/Self Care Home Management;Cryotherapy;Electrical Stimulation;Iontophoresis 22m/ml Dexamethasone;Moist Heat;Ultrasound;Neuromuscular re-education;Manual techniques;Dry needling;Therapeutic activities;Therapeutic exercise   PT Next Visit Plan LE ROM/strengthening; instruction in HEP - continued encouragement for consistent home program . assess response to [facility] for edema reduction.    Consulted and Agree with Plan of Care Patient      Patient will benefit from skilled therapeutic intervention in order to improve the following deficits and impairments:  Postural dysfunction, Improper body mechanics, Pain, Decreased range of motion, Decreased mobility, Decreased strength, Decreased endurance, Decreased activity tolerance  Visit Diagnosis: Pain in right knee  Pain in left knee  Other symptoms and signs involving the musculoskeletal system  Abnormal posture     Problem List Patient Active Problem List   Diagnosis Date Noted  . Concussion without loss of consciousness 12/07/2015  . Left knee pain 09/29/2015  . PCOS (polycystic ovarian syndrome) 05/31/2015  . Abnormal weight gain 03/10/2015  . Abnormal EEG 01/02/2012  . ADD (attention deficit disorder) 01/02/2012  . Anxiety  01/02/2012  . Clinical depression 01/02/2012  . Current tobacco use 01/02/2012   JKerin Perna PTA 01/12/2016 4:20 PM  [facility] [place] [address]Phone: [phone]  Fax:  [phone] Name: Ariana BramelMRN: [id number] of Birth: [birth year] PHYSICAL THERAPY DISCHARGE SUMMARY  Visits from Start of Care: 4  Current functional level related to goals /  functional outcomes: See last treatment note for discharge status   Remaining deficits: Continued pain and discomfort - needs to work consistently on [employer] / Equipment: HEP Plan: Patient agrees to discharge.  Patient goals were partially met. Patient is being discharged due to not returning since the last visit.  ?????    Celyn P. Helene Kelp PT, MPH 05/21/16 8:05 AM

## 2016-01-12 NOTE — Patient Instructions (Signed)
Hamstring Curl: Resisted (Sitting)    Facing anchor with tubing on left ankle, leg straight out, bend knee. Repeat _10___ times per set. Do _2___ sets per session. Do __1__ sessions every other day.   KNEE: Extension, Long Arc Quad (Band)    Place band around leg and under other foot. Pull band forward until knee is straight. Hold __1_ seconds. Use __blue______ band. _10__ reps per set, _2__ sets per day, _3__ days per week   [place] [facility] [place], [place] [address]  [phone] (office) [phone] (fax)

## 2016-01-18 ENCOUNTER — Encounter: Payer: BLUE CROSS/BLUE SHIELD | Admitting: Rehabilitative and Restorative Service Providers"

## 2016-01-20 ENCOUNTER — Encounter: Payer: BLUE CROSS/BLUE SHIELD | Admitting: Rehabilitative and Restorative Service Providers"

## 2016-01-25 ENCOUNTER — Encounter: Payer: Self-pay | Admitting: Physical Therapy

## 2016-01-30 ENCOUNTER — Encounter: Payer: Self-pay | Admitting: Physical Therapy

## 2016-02-08 ENCOUNTER — Ambulatory Visit: Payer: Self-pay | Admitting: Family Medicine

## 2016-06-12 ENCOUNTER — Encounter: Payer: Self-pay | Admitting: Family Medicine

## 2016-06-12 MED ORDER — AMOXICILLIN 500 MG PO CAPS: 500.0000 mg | ORAL_CAPSULE | Freq: Three times a day (TID) | ORAL | 0 refills | Status: DC

## 2016-07-05 ENCOUNTER — Encounter: Payer: Self-pay | Admitting: Family Medicine

## 2016-07-05 ENCOUNTER — Ambulatory Visit (INDEPENDENT_AMBULATORY_CARE_PROVIDER_SITE_OTHER): Payer: BLUE CROSS/BLUE SHIELD | Admitting: Family Medicine

## 2016-07-05 VITALS — BP 122/72 | HR 82 | Wt 322.0 lb

## 2016-07-05 DIAGNOSIS — R635 Abnormal weight gain: Secondary | ICD-10-CM | POA: Diagnosis not present

## 2016-07-05 DIAGNOSIS — R0683 Snoring: Secondary | ICD-10-CM

## 2016-07-05 DIAGNOSIS — F419 Anxiety disorder, unspecified: Secondary | ICD-10-CM

## 2016-07-05 DIAGNOSIS — G4733 Obstructive sleep apnea (adult) (pediatric): Secondary | ICD-10-CM | POA: Insufficient documentation

## 2016-07-05 DIAGNOSIS — F332 Major depressive disorder, recurrent severe without psychotic features: Secondary | ICD-10-CM

## 2016-07-05 HISTORY — DX: Obstructive sleep apnea (adult) (pediatric): G47.33

## 2016-07-05 MED ORDER — VILAZODONE HCL 10 MG PO TABS: 10.0000 mg | ORAL_TABLET | Freq: Every day | ORAL | 2 refills | Status: DC

## 2016-07-05 NOTE — Patient Instructions (Signed)
Thank you for coming in today. You should hear about the visit with the Dietitian.  Please also follow up with your therapist.  Restart Viibryd 10mg  daily.  Recheck in 1 month.   Use Myfitness pal daily to keep a food log.

## 2016-07-05 NOTE — Progress Notes (Addendum)
Ariana Parks is a 36 y.o. female who presents to [place] [place] today for anxiety/depression, obesity, and snoring.  Mood: Patient has a history of depression and anxiety. This previously was moderately controlled with Viibryd and counseling. She has stopped both. He was doing reasonably well until one of her friends recently committed suicide. This is obviously bothersome to the patient and she feels worse. She notes worsening anhedonia and depressed mood.  She notes that she's had trouble tolerating many medications for depression or anxiety in the past. She was able to tolerate 10 mg of Viibryd daily.  Snoring: Patient snores and has had observed apnea Episodes in the past. She is concerned that she may have sleep apnea. She feels tired during the day. She occasionally falls asleep when sitting. [place] 5/8  Morbid obesity: Patient has battled with her weight her entire life. She's tried some mild home lifestyle changes which have not helped well in the past. She is reluctant to consider bariatric surgery at this time but is frustrated with her weight. She does not currently count calories and does not exercise regularly.   Past Medical History:  Diagnosis Date  . Abnormal Pap smear of cervix 2005   Mod dysplasia  . Depression   . GERD (gastroesophageal reflux disease)   . High cholesterol   . Panic attack   . PCOS (polycystic ovarian syndrome)    Past Surgical History:  Procedure Laterality Date  . KNEE SURGERY    . LEEP    . TONSILLECTOMY    . WISDOM TOOTH EXTRACTION     Social History  Substance Use Topics  . Smoking status: Current Every Day Smoker    Packs/day: 0.50    Years: 15.00    Types: Cigarettes  . Smokeless tobacco: Never Used     Comment: using vapes  . Alcohol use 0.0 oz/week   family history includes GER disease in her father; Heart  attack in her other; Hypertension in her mother; Migraines in her father; Thyroid disease in her brother and mother.  ROS as above:  Medications: Current Outpatient Prescriptions  Medication Sig Dispense Refill  . Calcium Carbonate Antacid (ANTACID PO) Take by mouth.    . methocarbamol (ROBAXIN) 500 MG tablet Take 500 mg by mouth 4 (four) times daily.    . ranitidine (ZANTAC) 150 MG capsule Take 150 mg by mouth.    . medroxyPROGESTERone (PROVERA) 10 MG tablet Take 1 tablet (10 mg total) by mouth daily. Use for ten days (Patient not taking: Reported on 07/05/2016) 10 tablet 12  . Vilazodone HCl (VIIBRYD) 10 MG TABS Take 1 tablet (10 mg total) by mouth daily. 30 tablet 2   No current facility-administered medications for this visit.    Allergies  Allergen Reactions  . Celexa [Citalopram Hydrobromide]   . Ibuprofen     High doses  . Prednisone   . Prilosec [Omeprazole]   . Wellbutrin [Bupropion]   . Flexeril [Cyclobenzaprine] Other (See Comments)    Dreams    Health Maintenance Health Maintenance  Topic Date Due  . HIV Screening  08/20/1996  . TETANUS/TDAP  08/20/2000  . PAP SMEAR  08/09/2007  . INFLUENZA VACCINE  01/24/2016     Exam:  BP 122/72   Pulse 82   Wt (!) 322 lb (146.1 kg)   BMI 47.21 kg/m  Gen: Well NAD HEENT: EOMI,  MMM Lungs: Normal work of breathing. CTABL Heart: RRR no  MRG Abd: NABS, Soft. Nondistended, Nontender Exts: Brisk capillary refill, warm and well perfused.  Psych: Alert and oriented normal speech thought process and affect.  Depression screen [facility] 2/9 07/05/2016 01/11/2016 12/14/2015  Decreased Interest 3 1 1   Down, Depressed, Hopeless 3 2 2   PHQ - 2 Score 6 3 3   Altered sleeping 3 2 2   Tired, decreased energy 3 1 2   Change in appetite 2 1 2   Feeling bad or failure about yourself  2 1 1   Trouble concentrating 2 1 1   Moving slowly or fidgety/restless 1 1 2   Suicidal thoughts 1 1 1   PHQ-9 Score 20 11 14   Difficult doing work/chores -  Somewhat difficult Somewhat difficult   GAD 7 : Generalized Anxiety Score 07/05/2016 01/11/2016 12/14/2015  Nervous, Anxious, on Edge 2 2 2   Control/stop worrying 3 1 2   Worry too much - different things 3 1 1   Trouble relaxing 3 1 1   Restless 2 1 2   Easily annoyed or irritable 2 1 1   Afraid - awful might happen 3 2 2   Total GAD 7 Score 18 9 11   Anxiety Difficulty - Somewhat difficult Somewhat difficult      No results found for this or any previous visit (from the past 72 hour(s)). No results found.    Assessment and Plan: 36 y.o. female with  Mood: Worsening. Restart medication/therapy and restart 10 mg Viibryd daily. Recheck in one month.  Possible sleep apnea: Concerning with positive STOPBANG study. Refer for sleep study  Morbid obesity: Obviously concerning. Discussed options. Refer to registered dietitian.  Orders Placed This Encounter  Procedures  . Amb ref to Medical Nutrition Therapy-MNT    Referral Priority:   Routine    Referral Type:   Consultation    Referral Reason:   Specialty Services Required    Requested Specialty:   Nutrition    Number of Visits Requested:   1  . Home sleep test    Standing Status:   Future    Standing Expiration Date:   07/05/2017    Scheduling Instructions:     [facility]    Order Specific Question:   Where should this test be performed:    Answer:   Other    Discussed warning signs or symptoms. Please see discharge instructions. Patient expresses understanding.

## 2016-07-06 ENCOUNTER — Telehealth: Payer: Self-pay | Admitting: Physician Assistant

## 2016-07-06 ENCOUNTER — Other Ambulatory Visit: Payer: Self-pay | Admitting: Physician Assistant

## 2016-07-06 MED ORDER — OSELTAMIVIR PHOSPHATE 75 MG PO CAPS: 75.0000 mg | ORAL_CAPSULE | Freq: Two times a day (BID) | ORAL | 0 refills | Status: DC

## 2016-07-06 NOTE — Telephone Encounter (Signed)
Pt was directly exposed to the flu yesterday, would like Tamiflu. Will route to Provider in office today.

## 2016-07-06 NOTE — Telephone Encounter (Signed)
Ok I sent tamiflu to pharmacy bid for 5 days.

## 2016-07-06 NOTE — Telephone Encounter (Signed)
Pt advised.

## 2016-07-17 ENCOUNTER — Encounter: Payer: Self-pay | Admitting: Family Medicine

## 2016-08-02 ENCOUNTER — Ambulatory Visit (INDEPENDENT_AMBULATORY_CARE_PROVIDER_SITE_OTHER): Payer: BLUE CROSS/BLUE SHIELD | Admitting: Family Medicine

## 2016-08-02 ENCOUNTER — Encounter: Payer: Self-pay | Admitting: Family Medicine

## 2016-08-02 VITALS — BP 112/62 | HR 67 | Wt 322.0 lb

## 2016-08-02 DIAGNOSIS — F418 Other specified anxiety disorders: Secondary | ICD-10-CM | POA: Diagnosis not present

## 2016-08-02 DIAGNOSIS — R0683 Snoring: Secondary | ICD-10-CM | POA: Diagnosis not present

## 2016-08-02 MED ORDER — LORAZEPAM 0.5 MG PO TABS: ORAL_TABLET | ORAL | 0 refills | Status: DC

## 2016-08-02 NOTE — Patient Instructions (Signed)
Thank you for coming in today. Keep on the diet and exercise.  Let me know how things are going.  USe ativan prior to dental procedures.  Do not drive after taking this medicine.  Call or go to the emergency room if you get worse, have trouble breathing, have chest pains, or palpitations.

## 2016-08-02 NOTE — Progress Notes (Signed)
Ariana Parks is a 36 y.o. female who presents to [place] [place] today for follow up anxiety/depression, obesity, and snoring.  Mood: Patient has not taken Viibryd since last visit due to forgetting it. However, she feels like her mood has improved by trying to keep herself busy. She has gone back to school and is studying something in the domain of electronic health records. She is also exercising (see below) and taking care of her brother.  Snoring: Sleep study completed in the last 2 days; results pending.  Morbid obesity: Patient met with dietitian and has been working on portion control, cutting out greasy foods, and eating more whole grains. She has been able to walk 10 min a day on her parents' treadmill before her knees start bothering her. She is looking into water aerobics.  Situational anxiety: She is requesting ativan for upcoming dental procedures (getting teeth pulled). Numbing medicine has been inadequate to calm her nerves in the past, and she cannot get in to see a sedation dentist until Sept.   Past Medical History:  Diagnosis Date  . Abnormal Pap smear of cervix 2005   Mod dysplasia  . Depression   . GERD (gastroesophageal reflux disease)   . High cholesterol   . Panic attack   . PCOS (polycystic ovarian syndrome)    Past Surgical History:  Procedure Laterality Date  . KNEE SURGERY    . LEEP    . TONSILLECTOMY    . WISDOM TOOTH EXTRACTION     Social History  Substance Use Topics  . Smoking status: Current Every Day Smoker    Packs/day: 0.50    Years: 15.00    Types: Cigarettes  . Smokeless tobacco: Never Used     Comment: using vapes  . Alcohol use 0.0 oz/week   family history includes GER disease in her father; Heart attack in her other; Hypertension in her mother; Migraines in her father; Thyroid disease in her brother and  mother.  ROS as above:  Medications: Current Outpatient Prescriptions  Medication Sig Dispense Refill  . Calcium Carbonate Antacid (ANTACID PO) Take by mouth.    . medroxyPROGESTERone (PROVERA) 10 MG tablet Take 1 tablet (10 mg total) by mouth daily. Use for ten days (Patient not taking: Reported on 07/05/2016) 10 tablet 12  . methocarbamol (ROBAXIN) 500 MG tablet Take 500 mg by mouth 4 (four) times daily.    . ranitidine (ZANTAC) 150 MG capsule Take 150 mg by mouth.    . Vilazodone HCl (VIIBRYD) 10 MG TABS Take 1 tablet (10 mg total) by mouth daily. 30 tablet 2   No current facility-administered medications for this visit.    Allergies  Allergen Reactions  . Celexa [Citalopram Hydrobromide]   . Ibuprofen     High doses  . Prednisone   . Prilosec [Omeprazole]   . Wellbutrin [Bupropion]   . Flexeril [Cyclobenzaprine] Other (See Comments)    Dreams    Health Maintenance Health Maintenance  Topic Date Due  . HIV Screening  08/20/1996  . TETANUS/TDAP  08/20/2000  . PAP SMEAR  08/09/2007  . INFLUENZA VACCINE  01/24/2016     Exam:  BP 112/62   Pulse 67   Wt (!) 322 lb (146.1 kg)   BMI 47.21 kg/m  Gen: Well NAD HEENT: EOMI,  MMM Lungs: Normal work of breathing. CTABL Heart: RRR no MRG Abd: NABS, Soft. Nondistended, Nontender Exts: Brisk capillary refill, warm and well  perfused.   Depression screen [facility] 2/9 08/02/2016 07/05/2016 01/11/2016 12/14/2015  Decreased Interest 1 3 1 1   Down, Depressed, Hopeless 1 3 2 2   PHQ - 2 Score 2 6 3 3   Altered sleeping 2 3 2 2   Tired, decreased energy 2 3 1 2   Change in appetite 1 2 1 2   Feeling bad or failure about yourself  2 2 1 1   Trouble concentrating 1 2 1 1   Moving slowly or fidgety/restless 1 1 1 2   Suicidal thoughts 1 1 1 1   PHQ-9 Score 12 20 11 14   Difficult doing work/chores - - Somewhat difficult Somewhat difficult   GAD 7 : Generalized Anxiety Score 08/02/2016 07/05/2016 01/11/2016 12/14/2015  Nervous, Anxious, on Edge 2 2 2  2   Control/stop worrying 2 3 1 2   Worry too much - different things 2 3 1 1   Trouble relaxing 1 3 1 1   Restless 1 2 1 2   Easily annoyed or irritable 1 2 1 1   Afraid - awful might happen 2 3 2 2   Total GAD 7 Score 11 18 9 11   Anxiety Difficulty - - Somewhat difficult Somewhat difficult      No results found for this or any previous visit (from the past 72 hour(s)). No results found.   Assessment and Plan: 36 y.o. female with  Anxiety/depression: Markedly improved without medication; hold Viibryd.  Snoring: Follow up sleep study results.  Morbid obesity: No weight gain since last visit. Patient has made diet and lifestyle changes and seems motivated. She has a follow up with her dietician.  Situational anxiety: Ativan PRN for upcoming dental procedures.  Follow up in 3 months.  No orders of the defined types were placed in this encounter.  Meds ordered this encounter  Medications  . LORazepam (ATIVAN) 0.5 MG tablet    Sig: 1-2 tabs 30-60 mins prior to dental procedures    Dispense:  10 tablet    Refill:  0     Discussed warning signs or symptoms. Please see discharge instructions. Patient expresses understanding.

## 2016-08-06 ENCOUNTER — Encounter: Payer: Self-pay | Admitting: Family Medicine

## 2016-08-06 ENCOUNTER — Telehealth: Payer: Self-pay | Admitting: Family Medicine

## 2016-08-06 DIAGNOSIS — G4733 Obstructive sleep apnea (adult) (pediatric): Secondary | ICD-10-CM

## 2016-08-06 MED ORDER — AMBULATORY NON FORMULARY MEDICATION: 0 refills | Status: DC

## 2016-08-06 NOTE — Telephone Encounter (Signed)
Sleep study shows sleep apnea. Plan for CPAP machine. Let me know if you do not hear from anyone.

## 2016-08-07 NOTE — Telephone Encounter (Signed)
CPAP order, sleep study, demographics, insurance, and OV notes faxed to [employer] at [phone]. Pt notified. Copy of sleep study placed up front for pick up per pt request.

## 2016-08-18 ENCOUNTER — Encounter: Payer: Self-pay | Admitting: Emergency Medicine

## 2016-08-18 ENCOUNTER — Emergency Department (INDEPENDENT_AMBULATORY_CARE_PROVIDER_SITE_OTHER)
Admission: EM | Admit: 2016-08-18 | Discharge: 2016-08-18 | Disposition: A | Discharge status: Home / Self Care | Payer: BLUE CROSS/BLUE SHIELD | Source: Home / Self Care | Attending: Family Medicine | Admitting: Family Medicine

## 2016-08-18 DIAGNOSIS — K0889 Other specified disorders of teeth and supporting structures: Secondary | ICD-10-CM | POA: Diagnosis not present

## 2016-08-18 DIAGNOSIS — K029 Dental caries, unspecified: Secondary | ICD-10-CM

## 2016-08-18 MED ORDER — NAPROXEN 375 MG PO TABS: 375.0000 mg | ORAL_TABLET | Freq: Two times a day (BID) | ORAL | 0 refills | Status: DC

## 2016-08-18 MED ORDER — AMOXICILLIN 500 MG PO CAPS: 500.0000 mg | ORAL_CAPSULE | Freq: Three times a day (TID) | ORAL | 0 refills | Status: DC

## 2016-08-18 NOTE — ED Provider Notes (Signed)
CSN: [id number]     Arrival date & time 08/18/16  1648 History   First MD Initiated Contact with Patient 08/18/16 1728     Chief Complaint  Patient presents with  . Dental Pain   (Consider location/radiation/quality/duration/timing/severity/associated sxs/prior Treatment) HPI Ariana Parks is a 36 y.o. female presenting to [facility] with c/o severe gradually worsening Left upper tooth pain that started 2 days ago. She is concerned she has a dental abscess. She has tried OTC medications w/o relief. She has not been able to f/u with dentist yet. Denies fever, chills, n/v/d.    Past Medical History:  Diagnosis Date  . Abnormal Pap smear of cervix 2005   Mod dysplasia  . Depression   . GERD (gastroesophageal reflux disease)   . High cholesterol   . OSA (obstructive sleep apnea) 07/05/2016  . Panic attack   . PCOS (polycystic ovarian syndrome)    Past Surgical History:  Procedure Laterality Date  . KNEE SURGERY    . LEEP    . TONSILLECTOMY    . WISDOM TOOTH EXTRACTION     Family History  Problem Relation Age of Onset  . Hypertension Mother   . Thyroid disease Mother   . GER disease Father   . Migraines Father   . Heart attack Other   . Thyroid disease Brother    Social History  Substance Use Topics  . Smoking status: Current Every Day Smoker    Packs/day: 0.50    Years: 15.00    Types: Cigarettes  . Smokeless tobacco: Never Used     Comment: using vapes  . Alcohol use 0.0 oz/week   OB History    Gravida Para Term Preterm AB Living   0 0 0 0 0 0   SAB TAB Ectopic Multiple Live Births   0 0 0 0       Review of Systems  Constitutional: Negative for chills and fever.  HENT: Positive for dental problem. Negative for congestion, sore throat and trouble swallowing.     Allergies  Celexa [citalopram hydrobromide]; Ibuprofen; Prednisone; Prilosec [omeprazole]; Wellbutrin [bupropion]; and Flexeril [cyclobenzaprine]  Home Medications   Prior to Admission medications    Medication Sig Start Date End Date Taking? Authorizing Provider  AMBULATORY NON FORMULARY MEDICATION Continuous positive airway pressure (CPAP) machine auto-titrate to a max pressure of 20 cm of H2O pressure, with all supplemental supplies as needed. 08/06/16   Gregor Hams, MD  amoxicillin (AMOXIL) 500 MG capsule Take 1 capsule (500 mg total) by mouth 3 (three) times daily. 08/18/16   Noland Fordyce, PA-C  Calcium Carbonate Antacid (ANTACID PO) Take by mouth.    Historical Provider, MD  LORazepam (ATIVAN) 0.5 MG tablet 1-2 tabs 30-60 mins prior to dental procedures 08/02/16   Gregor Hams, MD  medroxyPROGESTERone (PROVERA) 10 MG tablet Take 1 tablet (10 mg total) by mouth daily. Use for ten days Patient not taking: Reported on 07/05/2016 07/05/15   Emily Filbert, MD  methocarbamol (ROBAXIN) 500 MG tablet Take 500 mg by mouth 4 (four) times daily.    Historical Provider, MD  naproxen (NAPROSYN) 375 MG tablet Take 1 tablet (375 mg total) by mouth 2 (two) times daily. 08/18/16   Noland Fordyce, PA-C  ranitidine (ZANTAC) 150 MG capsule Take 150 mg by mouth.    Historical Provider, MD  Vilazodone HCl (VIIBRYD) 10 MG TABS Take 1 tablet (10 mg total) by mouth daily. 07/05/16   Gregor Hams, MD   Meds Ordered  and Administered this Visit  Medications - No data to display  BP 123/80 (BP Location: Right Arm)   Pulse 70   Temp 98.1 F (36.7 C) (Oral)   Wt (!) 325 lb (147.4 kg)   LMP 07/14/2016 (Approximate)   SpO2 96%   BMI 47.65 kg/m  No data found.   Physical Exam  Constitutional: She is oriented to person, place, and time. She appears well-developed and well-nourished. No distress.  HENT:  Head: Normocephalic and atraumatic.  Mouth/Throat: Uvula is midline, oropharynx is clear and moist and mucous membranes are normal. No trismus in the jaw. Abnormal dentition. Dental caries present. No uvula swelling.  Multiple dental caries and chipped teeth including Left upper teeth where reported pain is. No  gingival abscess noted.   Eyes: EOM are normal.  Neck: Normal range of motion.  Cardiovascular: Normal rate.   Pulmonary/Chest: Effort normal. No respiratory distress.  Musculoskeletal: Normal range of motion.  Neurological: She is alert and oriented to person, place, and time.  Skin: Skin is warm and dry. She is not diaphoretic.  Psychiatric: She has a normal mood and affect. Her behavior is normal.  Nursing note and vitals reviewed.   Urgent Care Course     Procedures (including critical care time)  Labs Review Labs Reviewed - No data to display  Imaging Review No results found.    MDM   1. Pain, dental   2. Pain due to dental caries    Hx and exam c/w dental caries.   Rx: Amoxicillin and naproxen Offered magic mouthwash with lidocaine, pt declined Dental resources provided. Encouraged to call Monday to schedule f/u appointment this week.     Noland Fordyce, PA-C 08/18/16 1742

## 2016-08-18 NOTE — Discharge Instructions (Signed)
°  Patients with Medicaid: [facility] °[address], [phone] °[address], [phone] ° °If unable to pay, or uninsured, contact [facility] ([phone]) or [facility] ([phone] in [place], [phone] in [place]) to become qualified for the adult dental clinic ° °Other Low-Cost Community [facility] Services: °[facility]- [address], [place], [place], [address] °   [phone], Ext. [phone] °   2nd and 4th Thursday of the month at 6:30am °   10 clients each day by appointment, can sometimes see walk-in patients if someone does not show for an appointment °[facility]- [address], [place], [place], [address] °   [phone] °[facility]- [address], [place], , [address] °   [phone] ° °[facility]- [phone] °[facility]- [phone] °[facility]- [phone] ° °

## 2016-08-18 NOTE — ED Triage Notes (Signed)
Pt c/o tooth pain on top left that started yesterday. She has not scheduled appt with dentist yet.

## 2016-09-11 ENCOUNTER — Telehealth: Payer: Self-pay

## 2016-09-11 NOTE — Telephone Encounter (Signed)
Received fax from [facility] advising pt has declined CPAP machine for now due to costs. Pt will contact [facility] when she is ready to receive CPAP.

## 2016-10-30 ENCOUNTER — Telehealth: Payer: Self-pay | Admitting: Family Medicine

## 2016-10-30 ENCOUNTER — Encounter: Payer: Self-pay | Admitting: Family Medicine

## 2016-10-30 ENCOUNTER — Ambulatory Visit (INDEPENDENT_AMBULATORY_CARE_PROVIDER_SITE_OTHER): Payer: BLUE CROSS/BLUE SHIELD | Admitting: Family Medicine

## 2016-10-30 DIAGNOSIS — F419 Anxiety disorder, unspecified: Secondary | ICD-10-CM

## 2016-10-30 DIAGNOSIS — E282 Polycystic ovarian syndrome: Secondary | ICD-10-CM

## 2016-10-30 DIAGNOSIS — F332 Major depressive disorder, recurrent severe without psychotic features: Secondary | ICD-10-CM | POA: Diagnosis not present

## 2016-10-30 DIAGNOSIS — R635 Abnormal weight gain: Secondary | ICD-10-CM

## 2016-10-30 NOTE — Patient Instructions (Addendum)
Thank you for coming in today. Get labs fasting in the near future.   Continue viibryd.   Please schedule for a pap smear in the near future.  Let me know ahead of time and I will prescribe anti anxiety medicine to take ahead of time.   Pap Test Why am I having this test? A pap test is sometimes called a pap smear. It is a screening test that is used to check for signs of cancer of the vagina, cervix, and uterus. The test can also identify the presence of infection or precancerous changes. Your health care provider will likely recommend you have this test done on a regular basis. This test may be done:  Every 3 years, starting at age 63.  Every 5 years, in combination with testing for the presence of human papillomavirus (HPV).  More or less often depending on other medical conditions. What kind of sample is taken? Using a small cotton swab, plastic spatula, or brush, your health care provider will collect a sample of cells from the surface of your cervix. Your cervix is the opening to your uterus, also called a womb. Secretions from the cervix and vagina may also be collected. How do I prepare for this test?  Be aware of where you are in your menstrual cycle. You may be asked to reschedule the test if you are menstruating on the day of the test.  You may need to reschedule if you have a known vaginal infection on the day of the test.  You may be asked to avoid douching or taking a bath the day before or the day of the test.  Some medicines can cause abnormal test results, such as digitalis and tetracycline. Talk with your health care provider before your test if you take one of these medicines. What do the results mean? Abnormal test results may indicate a number of health conditions. These may include:  Cancer. Although pap test results cannot be used to diagnose cancer of the cervix, vagina, or uterus, they may suggest the possibility of cancer. Further tests would be required to  determine if cancer is present.  Sexually transmitted disease.  Fungal infection.  Parasite infection.  Herpes infection.  A condition causing or contributing to infertility. It is your responsibility to obtain your test results. Ask the lab or department performing the test when and how you will get your results. Contact your health care provider to discuss any questions you have about your results. Talk with your health care provider to discuss your results, treatment options, and if necessary, the need for more tests. Talk with your health care provider if you have any questions about your results. This information is not intended to replace advice given to you by your health care provider. Make sure you discuss any questions you have with your health care provider. Document Released: 09/01/2002 Document Revised: 02/15/2016 Document Reviewed: 11/02/2013 Elsevier Interactive Patient Education  2017 [employer].

## 2016-10-30 NOTE — Telephone Encounter (Signed)
Received PA on Viibryd sent through cover my meds waiting on determination. - CF

## 2016-10-30 NOTE — Progress Notes (Signed)
Ariana Parks is a 36 y.o. female who presents to [place]: [place] today for follow-up of anxiety. Patient has been treated for anxiety with therapy and fibrinogen for the last several months. Overall she's doing. Well and notes improved symptoms. She denies any SI or HI.  Cervical cancer screening: Patient has had several cancer screening last for many years ago. She denies fevers or chills nausea vomiting or diarrhea.  She cannot recall her last Tdap.    Past Medical History:  Diagnosis Date  . Abnormal Pap smear of cervix 2005   Mod dysplasia  . Depression   . GERD (gastroesophageal reflux disease)   . High cholesterol   . OSA (obstructive sleep apnea) 07/05/2016  . Panic attack   . PCOS (polycystic ovarian syndrome)    Past Surgical History:  Procedure Laterality Date  . KNEE SURGERY    . LEEP    . TONSILLECTOMY    . WISDOM TOOTH EXTRACTION     Social History  Substance Use Topics  . Smoking status: Current Every Day Smoker    Packs/day: 0.50    Years: 15.00    Types: Cigarettes  . Smokeless tobacco: Never Used     Comment: using vapes  . Alcohol use 0.0 oz/week   family history includes GER disease in her father; Heart attack in her other; Hypertension in her mother; Migraines in her father; Thyroid disease in her brother and mother.  ROS as above:  Medications: Current Outpatient Prescriptions  Medication Sig Dispense Refill  . Calcium Carbonate Antacid (ANTACID PO) Take by mouth.    Marland Kitchen LORazepam (ATIVAN) 0.5 MG tablet 1-2 tabs 30-60 mins prior to dental procedures 10 tablet 0  . methocarbamol (ROBAXIN) 500 MG tablet Take 500 mg by mouth 4 (four) times daily.    . naproxen (NAPROSYN) 375 MG tablet Take 1 tablet (375 mg total) by mouth 2 (two) times daily. 20 tablet 0  . ranitidine (ZANTAC) 150 MG capsule Take 150 mg by mouth.    . Vilazodone HCl  (VIIBRYD) 10 MG TABS Take 1 tablet (10 mg total) by mouth daily. 30 tablet 2   No current facility-administered medications for this visit.    Allergies  Allergen Reactions  . Celexa [Citalopram Hydrobromide]   . Ibuprofen     High doses  . Prednisone   . Prilosec [Omeprazole]   . Wellbutrin [Bupropion]   . Flexeril [Cyclobenzaprine] Other (See Comments)    Dreams    Health Maintenance Health Maintenance  Topic Date Due  . HIV Screening  08/20/1996  . TETANUS/TDAP  08/20/2000  . PAP SMEAR  08/09/2007  . INFLUENZA VACCINE  01/23/2017     Exam:  BP 137/68   Pulse 73   Ht 5' 9.25" (1.759 m)   Wt (!) 328 lb 6.4 oz (149 kg)   SpO2 99%   BMI 48.15 kg/m  Gen: Well NAD HEENT: EOMI,  MMM Lungs: Normal work of breathing. CTABL Heart: RRR no MRG Abd: NABS, Soft. Nondistended, Nontender Exts: Brisk capillary refill, warm and well perfused.  Psych: Alert and oriented normal speech a process and affect.  Depression screen [facility] 2/9 10/30/2016 08/02/2016 07/05/2016 01/11/2016 12/14/2015  Decreased Interest 2 1 3 1 1   Down, Depressed, Hopeless 1 1 3 2 2   PHQ - 2 Score 3 2 6 3 3   Altered sleeping 3 2 3 2 2   Tired, decreased energy 2 2 3 1  2  Change in appetite 2 1 2 1 2   Feeling bad or failure about yourself  2 2 2 1 1   Trouble concentrating 1 1 2 1 1   Moving slowly or fidgety/restless 1 1 1 1 2   Suicidal thoughts 1 1 1 1 1   PHQ-9 Score 15 12 20 11 14   Difficult doing work/chores - - - Somewhat difficult Somewhat difficult   GAD 7 : Generalized Anxiety Score 08/02/2016 07/05/2016 01/11/2016 12/14/2015  Nervous, Anxious, on Edge 2 2 2 2   Control/stop worrying 2 3 1 2   Worry too much - different things 2 3 1 1   Trouble relaxing 1 3 1 1   Restless 1 2 1 2   Easily annoyed or irritable 1 2 1 1   Afraid - awful might happen 2 3 2 2   Total GAD 7 Score 11 18 9 11   Anxiety Difficulty - - Somewhat difficult Somewhat difficult        No results found for this or any previous visit (from  the past 72 hour(s)). No results found.    Assessment and Plan: 36 y.o. female with  Anxiety doing reasonably well. Continue Viibryd.  Morbid obesity: Check basic fasting labs work on weight loss.  Cervical cancer screening: Patient will return to clinic in the near future for cervical cancer screening with Pap smear. We'll discuss premedication for anxiety as needed.  Will administer tetanus vaccine at the next visit if possible.   Orders Placed This Encounter  Procedures  . CBC  . COMPLETE METABOLIC PANEL WITH GFR  . Hemoglobin A1c  . Lipid Panel w/reflex Direct LDL   No orders of the defined types were placed in this encounter.    Discussed warning signs or symptoms. Please see discharge instructions. Patient expresses understanding.

## 2017-01-19 IMAGING — CR DG CHEST 2V
2 series · 2 of 2 positions shown · non-contrast
Comparison: None.

CLINICAL DATA: Cough for 3 months, history of tobacco use

EXAM:
CHEST - 2 VIEW

[chest pa]
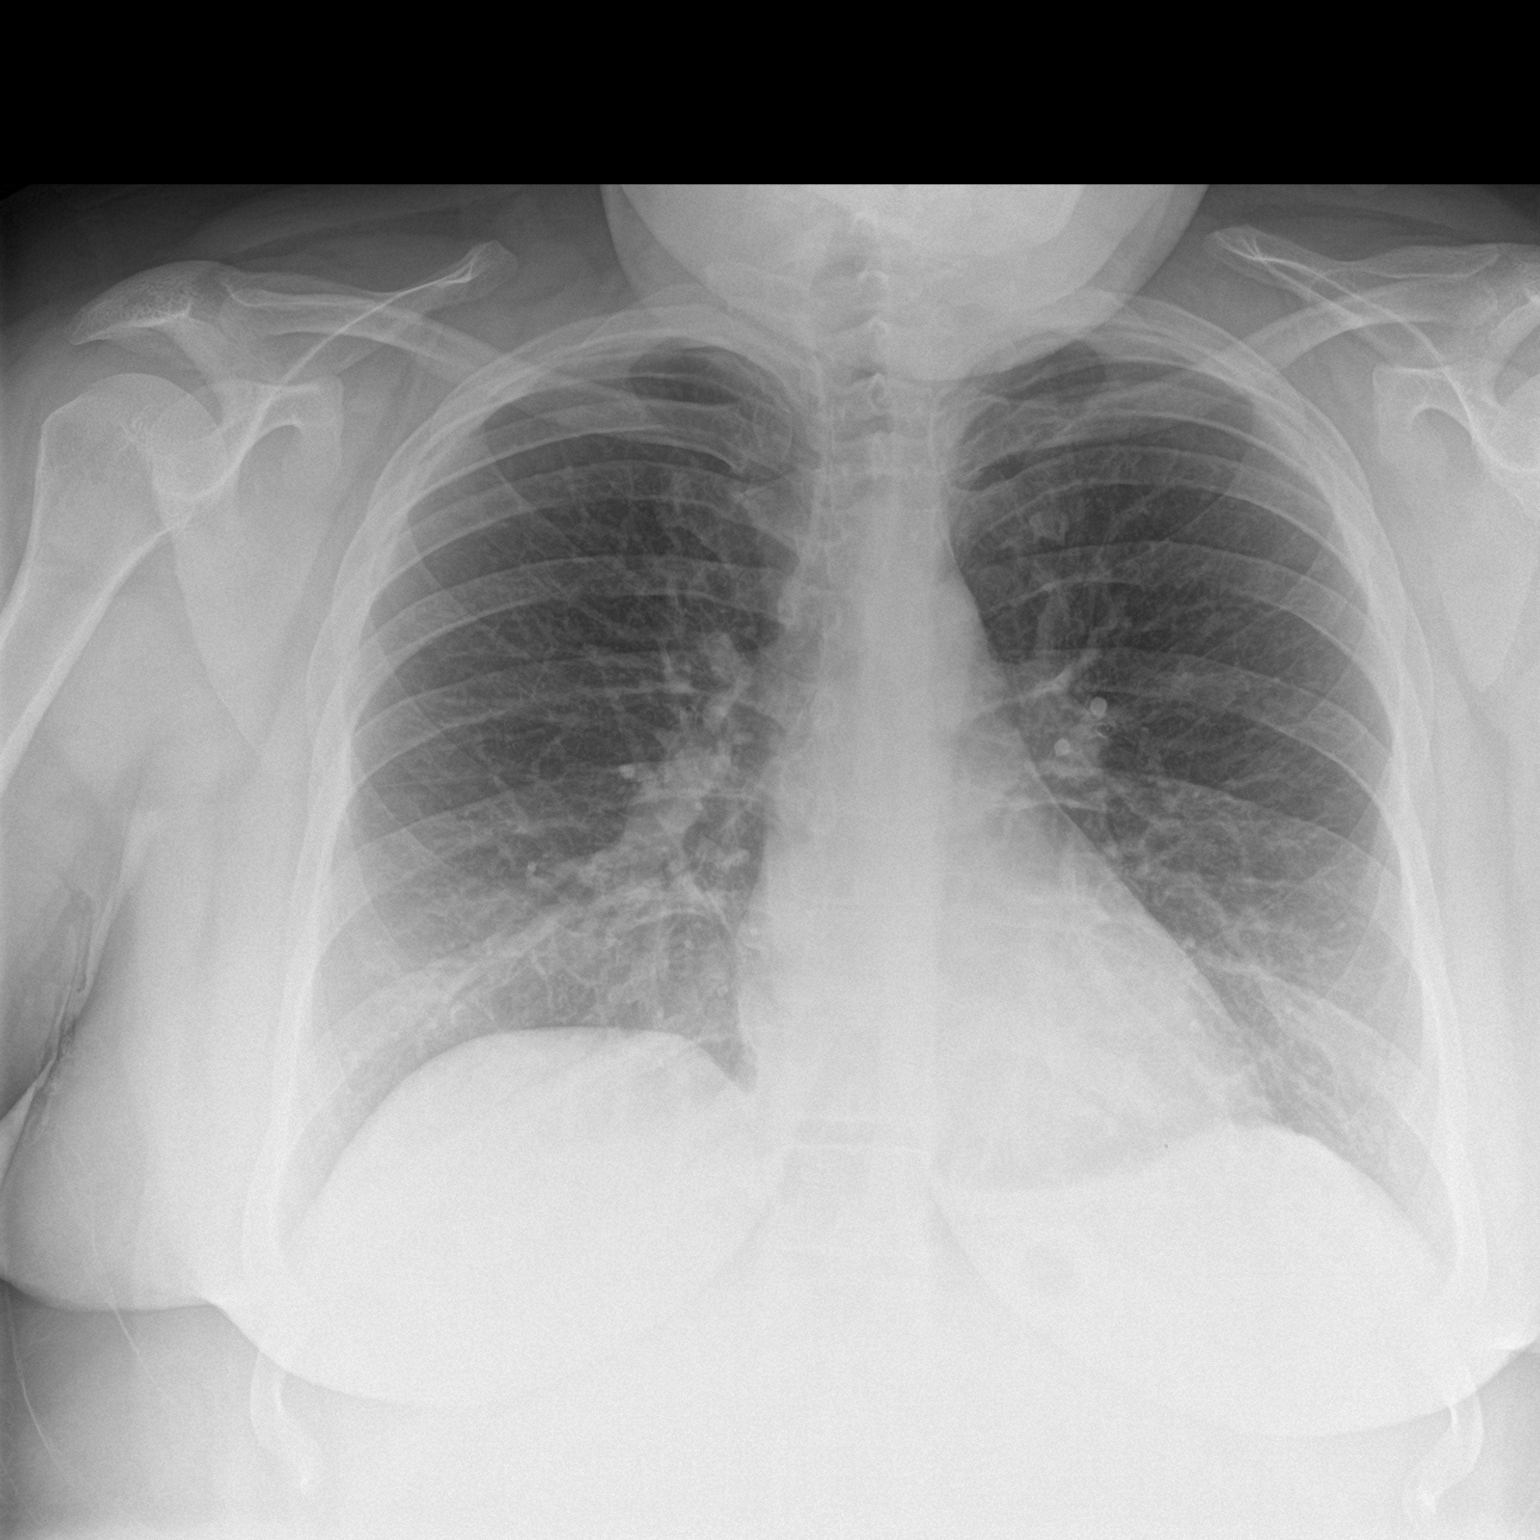

[chest lat]
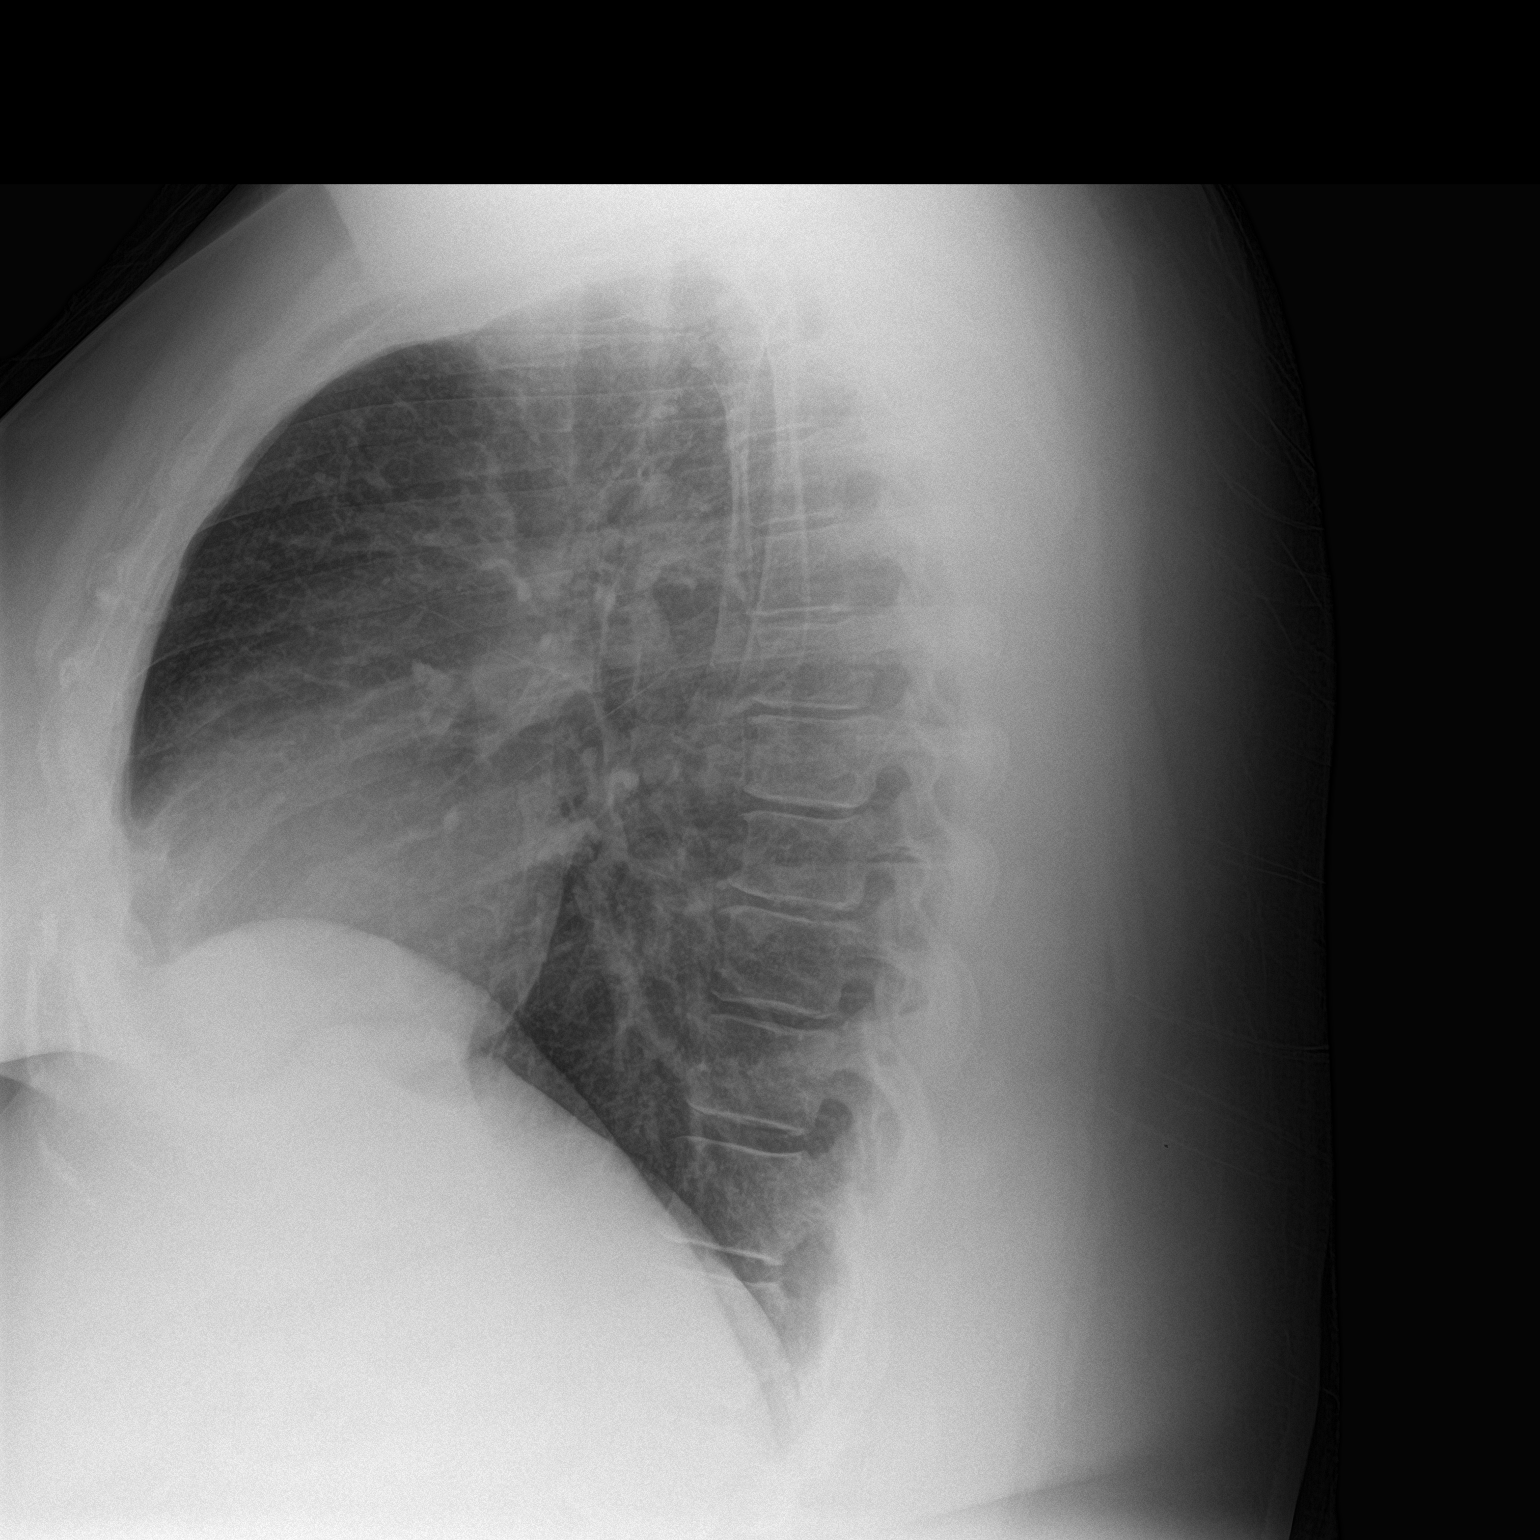

[2 of 2 positions shown; findings below may reference images not displayed]

FINDINGS: The heart size and mediastinal contours are within normal limits.
Both lungs are clear. The visualized skeletal structures are
unremarkable.
IMPRESSION: No active disease.

## 2017-02-13 ENCOUNTER — Encounter: Payer: Self-pay | Admitting: Family Medicine

## 2017-02-13 ENCOUNTER — Ambulatory Visit (INDEPENDENT_AMBULATORY_CARE_PROVIDER_SITE_OTHER): Payer: BLUE CROSS/BLUE SHIELD | Admitting: Family Medicine

## 2017-02-13 ENCOUNTER — Other Ambulatory Visit: Payer: Self-pay | Admitting: Family Medicine

## 2017-02-13 VITALS — BP 115/59 | HR 74 | Wt 305.0 lb

## 2017-02-13 DIAGNOSIS — F419 Anxiety disorder, unspecified: Secondary | ICD-10-CM

## 2017-02-13 DIAGNOSIS — Z720 Tobacco use: Secondary | ICD-10-CM | POA: Diagnosis not present

## 2017-02-13 DIAGNOSIS — F332 Major depressive disorder, recurrent severe without psychotic features: Secondary | ICD-10-CM | POA: Diagnosis not present

## 2017-02-13 DIAGNOSIS — N926 Irregular menstruation, unspecified: Secondary | ICD-10-CM | POA: Diagnosis not present

## 2017-02-13 DIAGNOSIS — Z23 Encounter for immunization: Secondary | ICD-10-CM | POA: Diagnosis not present

## 2017-02-13 DIAGNOSIS — E282 Polycystic ovarian syndrome: Secondary | ICD-10-CM

## 2017-02-13 LAB — CBC
HCT: 45.9 % — ABNORMAL HIGH (ref 35.0–45.0)
Hemoglobin: 15.2 g/dL (ref 11.7–15.5)
MCH: 31 pg (ref 27.0–33.0)
MCHC: 33.1 g/dL (ref 32.0–36.0)
MCV: 93.5 fL (ref 80.0–100.0)
MPV: 11 fL (ref 7.5–12.5)
PLATELETS: 299 10*3/uL (ref 140–400)
RBC: 4.91 MIL/uL (ref 3.80–5.10)
RDW: 13.4 % (ref 11.0–15.0)
WBC: 13.3 10*3/uL — AB (ref 3.8–10.8)

## 2017-02-13 LAB — TSH: TSH: 1.55 mIU/L

## 2017-02-13 MED ORDER — MEDROXYPROGESTERONE ACETATE 10 MG PO TABS: ORAL_TABLET | ORAL | 3 refills | Status: DC

## 2017-02-13 NOTE — Progress Notes (Signed)
Ariana Parks is a 36 y.o. female who presents to [place]: [place] today for irregular menstruation. Patient notes a new problem of irregular menstruation present for about 2 months. She knows daily continuous bleeding. She notes that sometimes heavy but usually is spotting. She's lost about 20 pounds recently and wonders if that has anything to do with it. She has a history of polycystic ovarian syndrome. She denies any fevers chills vomiting or diarrhea. She feels well otherwise.   Additionally she is here to talk about her depression and anxiety: She takes Viibryd daily and is feeling quite well. She feels optimistic about her life.  Past Medical History:  Diagnosis Date  . Abnormal Pap smear of cervix 2005   Mod dysplasia  . Depression   . GERD (gastroesophageal reflux disease)   . High cholesterol   . OSA (obstructive sleep apnea) 07/05/2016  . Panic attack   . PCOS (polycystic ovarian syndrome)    Past Surgical History:  Procedure Laterality Date  . KNEE SURGERY    . LEEP    . TONSILLECTOMY    . WISDOM TOOTH EXTRACTION     Social History  Substance Use Topics  . Smoking status: Current Every Day Smoker    Packs/day: 0.50    Years: 15.00    Types: Cigarettes  . Smokeless tobacco: Never Used     Comment: using vapes  . Alcohol use 0.0 oz/week   family history includes GER disease in her father; Heart attack in her other; Hypertension in her mother; Migraines in her father; Thyroid disease in her brother and mother.  ROS as above:  Medications: Current Outpatient Prescriptions  Medication Sig Dispense Refill  . Calcium Carbonate Antacid (ANTACID PO) Take by mouth.    Marland Kitchen LORazepam (ATIVAN) 0.5 MG tablet 1-2 tabs 30-60 mins prior to dental procedures 10 tablet 0  . methocarbamol (ROBAXIN) 500 MG tablet Take 500 mg by mouth 4 (four) times daily.    .  naproxen (NAPROSYN) 375 MG tablet Take 1 tablet (375 mg total) by mouth 2 (two) times daily. 20 tablet 0  . ranitidine (ZANTAC) 150 MG capsule Take 150 mg by mouth.    . Vilazodone HCl (VIIBRYD) 10 MG TABS Take 1 tablet (10 mg total) by mouth daily. 30 tablet 2  . medroxyPROGESTERone (PROVERA) 10 MG tablet 1 pill daily for 10 days every month for menstrual cycle. 10 tablet 3   No current facility-administered medications for this visit.    Allergies  Allergen Reactions  . Celexa [Citalopram Hydrobromide]   . Ibuprofen     High doses  . Prednisone   . Prilosec [Omeprazole]   . Wellbutrin [Bupropion]   . Flexeril [Cyclobenzaprine] Other (See Comments)    Dreams    Health Maintenance Health Maintenance  Topic Date Due  . HIV Screening  08/20/1996  . TETANUS/TDAP  08/20/2000  . PAP SMEAR  08/09/2007  . INFLUENZA VACCINE  Completed     Exam:  BP (!) 115/59   Pulse 74   Wt (!) 305 lb (138.3 kg)   BMI 44.72 kg/m   Wt Readings from Last 10 Encounters:  02/13/17 (!) 305 lb (138.3 kg)  10/30/16 (!) 328 lb 6.4 oz (149 kg)  08/18/16 (!) 325 lb (147.4 kg)  08/02/16 (!) 322 lb (146.1 kg)  07/05/16 (!) 322 lb (146.1 kg)  01/11/16 (!) 318 lb (144.2 kg)  12/07/15 (!) 314 lb (142.4 kg)  11/09/15 (!) 312 lb (141.5 kg)  09/29/15 (!) 304 lb (137.9 kg)  09/15/15 (!) 305 lb (138.3 kg)    Gen: Well NAD HEENT: EOMI,  MMM Lungs: Normal work of breathing. CTABL Heart: RRR no MRG Abd: NABS, Soft. Nondistended, Nontender Exts: Brisk capillary refill, warm and well perfused.  Psych: Alert and oriented normal speech thought process and affect.  Depression screen [facility] 2/9 02/13/2017 10/30/2016 08/02/2016 07/05/2016 01/11/2016  Decreased Interest 2 2 1 3 1   Down, Depressed, Hopeless 2 1 1 3 2   PHQ - 2 Score 4 3 2 6 3   Altered sleeping 2 3 2 3 2   Tired, decreased energy 2 2 2 3 1   Change in appetite 1 2 1 2 1   Feeling bad or failure about yourself  2 2 2 2 1   Trouble concentrating 1 1 1 2 1     Moving slowly or fidgety/restless 1 1 1 1 1   Suicidal thoughts 1 1 1 1 1   PHQ-9 Score 14 15 12 20 11   Difficult doing work/chores - - - - Somewhat difficult     No results found.    Assessment and Plan: 36 y.o. female with  Irregular menstrual bleeding: Likely related to PCO S. Patient is over 37 and smokes therefore estrogen Ace contraceptives are not safe. Plan to use Provera 10 mg for 10 days every month to cause normal withdrawal bleeding. Additionally we'll arrange for a CBC metabolic panel and TSH as well as a transvaginal ultrasound to assess endometrial stripe thickness.  Recheck in a month or 2.  Mood: Doing well. Continue current regimen.  Obesity: Doing well continue weight loss.  Influenza vaccine given today prior to discharge.   Orders Placed This Encounter  Procedures  . US Transvaginal Non-OB    Standing Status:   Future    Standing Expiration Date:   04/15/2018    Order Specific Question:   Reason for Exam (SYMPTOM  OR DIAGNOSIS REQUIRED)    Answer:   eval endometrium    Order Specific Question:   Preferred imaging location?    Answer:   [facility]  . Flu Vaccine QUAD 36+ mos IM  . CBC  . COMPLETE METABOLIC PANEL WITH GFR  . TSH   Meds ordered this encounter  Medications  . medroxyPROGESTERone (PROVERA) 10 MG tablet    Sig: 1 pill daily for 10 days every month for menstrual cycle.    Dispense:  10 tablet    Refill:  3     Discussed warning signs or symptoms. Please see discharge instructions. Patient expresses understanding.

## 2017-02-13 NOTE — Patient Instructions (Signed)
Thank you for coming in today. Take provera for 10 days each month to have a menstrual cycle.  Get labs and ultrasound.  Recheck in 1 month.  You are doing great!   Dysfunctional Uterine Bleeding Dysfunctional uterine bleeding is abnormal bleeding from the uterus. Dysfunctional uterine bleeding includes:  A period that comes earlier or later than usual.  A period that is lighter, heavier, or has blood clots.  Bleeding between periods.  Skipping one or more periods.  Bleeding after sexual intercourse.  Bleeding after menopause.  Follow these instructions at home: Pay attention to any changes in your symptoms. Follow these instructions to help with your condition: Eating and drinking  Eat well-balanced meals. Include foods that are high in iron, such as liver, meat, shellfish, green leafy vegetables, and eggs.  If you become constipated: ? Drink plenty of water. ? Eat fruits and vegetables that are high in water and fiber, such as spinach, carrots, raspberries, apples, and mango. Medicines  Take over-the-counter and prescription medicines only as told by your health care provider.  Do not change medicines without talking with your health care provider.  Aspirin or medicines that contain aspirin may make the bleeding worse. Do not take those medicines: ? During the week before your period. ? During your period.  If you were prescribed iron pills, take them as told by your health care provider. Iron pills help to replace iron that your body loses because of this condition. Activity  If you need to change your sanitary pad or tampon more than one time every 2 hours: ? Lie in bed with your feet raised (elevated). ? Place a cold pack on your lower abdomen. ? Rest as much as possible until the bleeding stops or slows down.  Do not try to lose weight until the bleeding has stopped and your blood iron level is back to normal. Other Instructions  For two months, write  down: ? When your period starts. ? When your period ends. ? When any abnormal bleeding occurs. ? What problems you notice.  Keep all follow up visits as told by your health care provider. This is important. Contact a health care provider if:  You get light-headed or weak.  You have nausea and vomiting.  You cannot eat or drink without vomiting.  You feel dizzy or have diarrhea while you are taking medicines.  You are taking birth control pills or hormones, and you want to change them or stop taking them. Get help right away if:  You develop a fever or chills.  You need to change your sanitary pad or tampon more than one time per hour.  Your bleeding becomes heavier, or your flow contains clots more often.  You develop pain in your abdomen.  You lose consciousness.  You develop a rash. This information is not intended to replace advice given to you by your health care provider. Make sure you discuss any questions you have with your health care provider. Document Released: 06/08/2000 Document Revised: 11/17/2015 Document Reviewed: 09/06/2014 Elsevier Interactive Patient Education  [employer].

## 2017-02-14 ENCOUNTER — Ambulatory Visit (INDEPENDENT_AMBULATORY_CARE_PROVIDER_SITE_OTHER): Payer: BLUE CROSS/BLUE SHIELD

## 2017-02-14 ENCOUNTER — Encounter: Payer: Self-pay | Admitting: Family Medicine

## 2017-02-14 DIAGNOSIS — D259 Leiomyoma of uterus, unspecified: Secondary | ICD-10-CM | POA: Diagnosis not present

## 2017-02-14 DIAGNOSIS — N926 Irregular menstruation, unspecified: Secondary | ICD-10-CM

## 2017-02-14 DIAGNOSIS — R7303 Prediabetes: Secondary | ICD-10-CM | POA: Insufficient documentation

## 2017-02-14 LAB — COMPLETE METABOLIC PANEL WITH GFR
ALT: 20 U/L (ref 6–29)
AST: 14 U/L (ref 10–30)
Albumin: 4.1 g/dL (ref 3.6–5.1)
Alkaline Phosphatase: 84 U/L (ref 33–115)
BUN: 16 mg/dL (ref 7–25)
CHLORIDE: 109 mmol/L (ref 98–110)
CO2: 21 mmol/L (ref 20–32)
CREATININE: 0.67 mg/dL (ref 0.50–1.10)
Calcium: 9.3 mg/dL (ref 8.6–10.2)
GFR, Est Non African American: >89 mL/min (ref >=60)
Glucose, Bld: 124 mg/dL — ABNORMAL HIGH (ref 65–99)
Potassium: 3.9 mmol/L (ref 3.5–5.3)
SODIUM: 140 mmol/L (ref 135–146)
Total Bilirubin: 0.2 mg/dL (ref 0.2–1.2)
Total Protein: 6.5 g/dL (ref 6.1–8.1)

## 2017-02-15 ENCOUNTER — Ambulatory Visit: Payer: Self-pay | Admitting: Family Medicine

## 2017-02-27 ENCOUNTER — Telehealth: Payer: Self-pay

## 2017-02-27 NOTE — Telephone Encounter (Signed)
Pt called and stated she was seen on 02/13/2017 for irregular bleeding. Pt stated that she has since started her period and is having really bad stomach pains. Pt stated that her period has been very heavy for the last 2 days. Pt stated that she has also been passing blood clots. Pt stated that her recent lab results showed her WBC count was elevated and wants to know if it could be related? Pt stated she has been taking naproxen to help with the pain. Please advise?

## 2017-02-28 NOTE — Telephone Encounter (Signed)
For heavy bleeding I think it makes sense to follow up with OBGYN or me.  Naproxen is ok.  Elevated WBC is mild and was the cast the last time we checked as well.  We will plan to repeat lab in the next few months to see if it is a trend.

## 2017-02-28 NOTE — Telephone Encounter (Signed)
Information discussed with pt. Pt will keep 03/13/2017 with pcp.

## 2017-03-06 IMAGING — MR MR CERVICAL SPINE W/O CM
5 of 6 series · 33 of 48 positions shown · non-contrast
Comparison: Cervical radiographs 02/22/2015

CLINICAL DATA: Cervical radiculopathy, acute. Right arm pain and
weakness

EXAM:
MRI CERVICAL SPINE WITHOUT CONTRAST
TECHNIQUE: Multiplanar, multisequence MR imaging of the cervical spine was
performed. No intravenous contrast was administered.

[Series 2: (id) tse sag · sagittal · 3.0mm · 0.41mm/px · 3 of 13 slices shown]
[im 1/13]
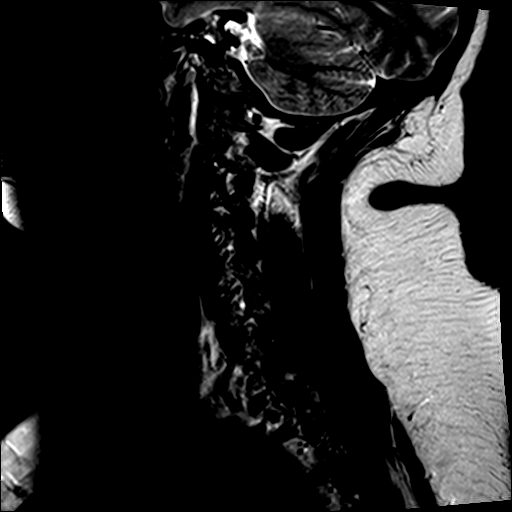
[im 4/13]
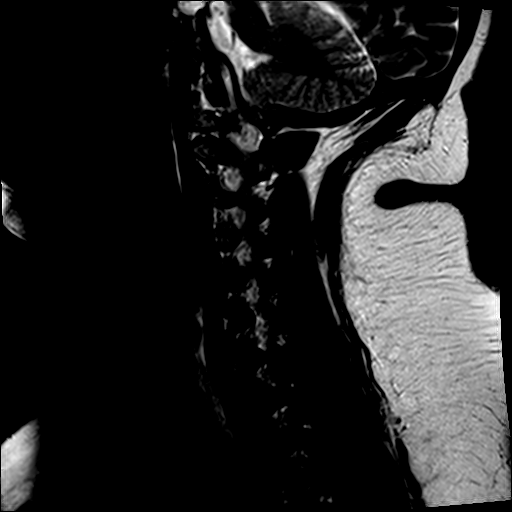
[im 7/13]
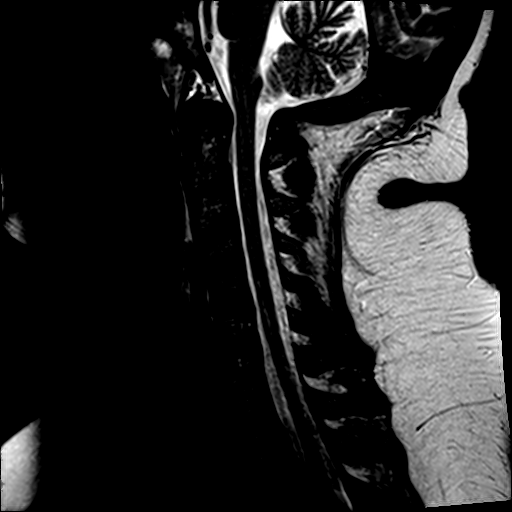

[Series 4: STIR · sagittal · 3.0mm · 0.82mm/px · 6 of 13 slices shown]
[im 1/13]
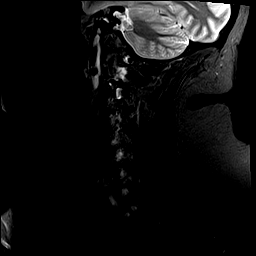
[im 3/13]
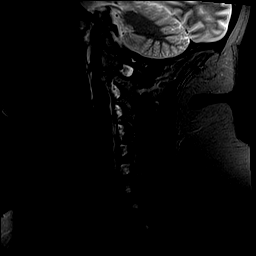
[im 5/13]
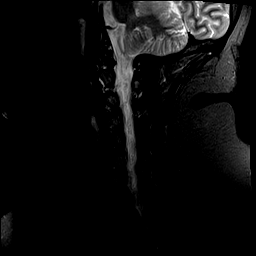
[im 8/13]
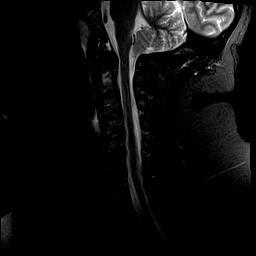
[im 10/13]
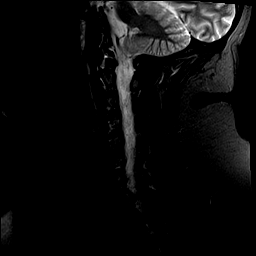
[im 13/13]
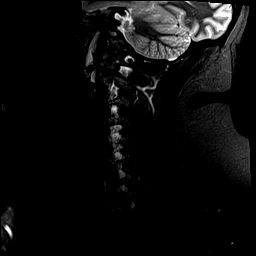

[Series 5: T2 · axial · 3.0mm · 0.39mm/px · z∈[-66,+31]mm · 9 of 29 slices shown (1 of 2)]
[im 1/29]
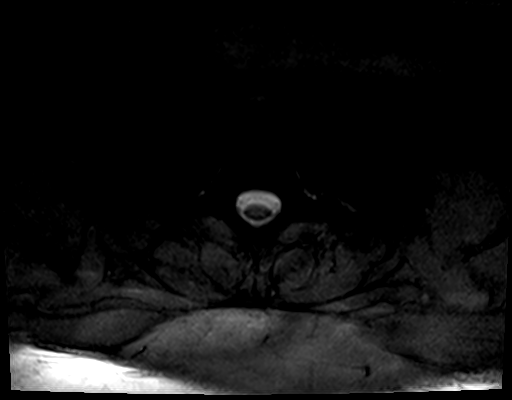
[im 5/29]
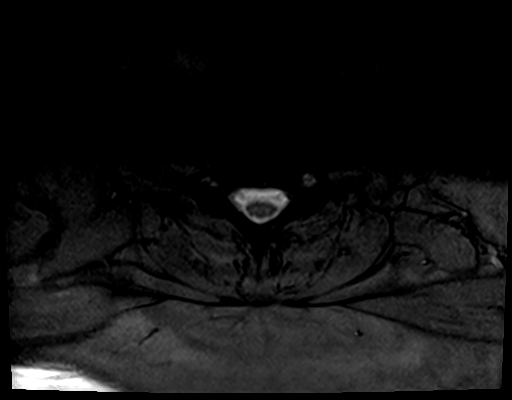
[im 10/29]
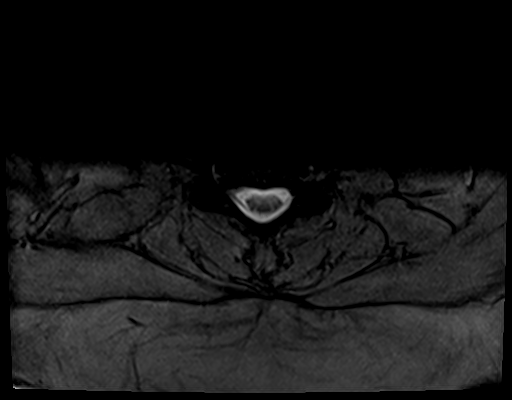
[im 12/29]
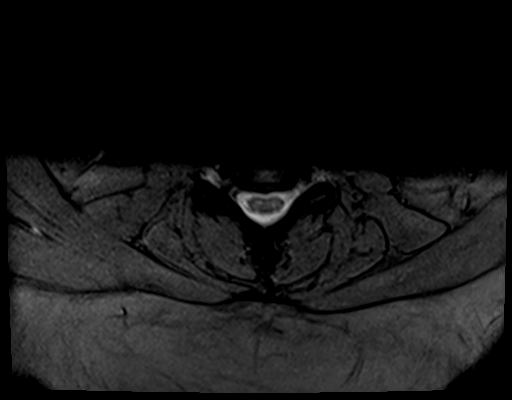
[im 15/29]
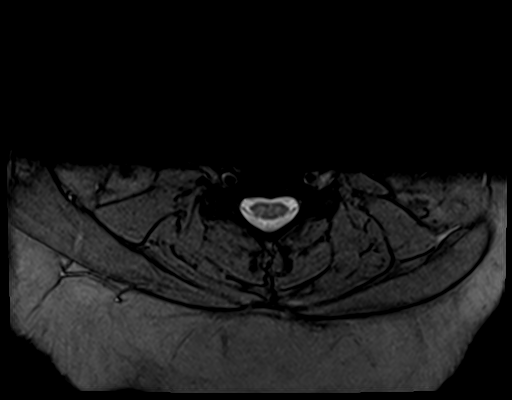
[im 17/29]
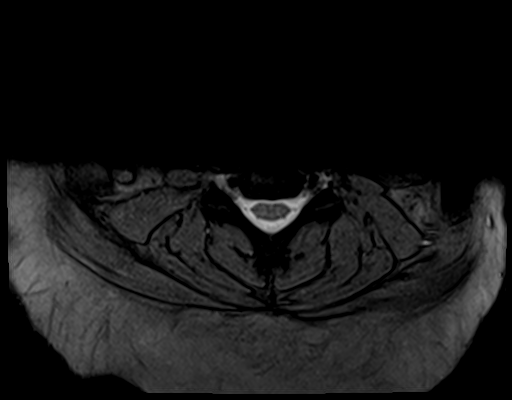
[im 19/29]
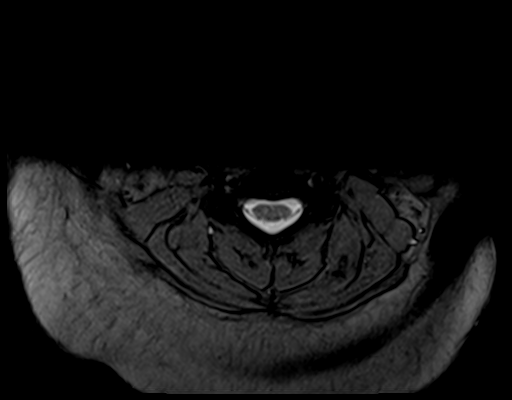
[im 24/29]
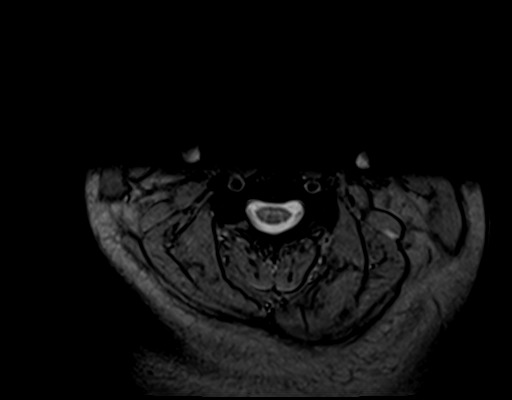
[im 29/29]
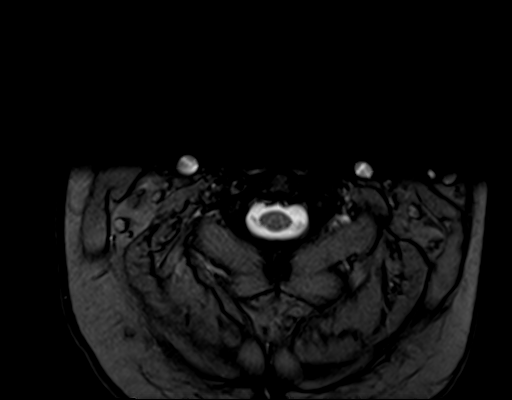

[Series 6: T2 · axial · 3.0mm · 0.62mm/px · z∈[-68,+26]mm · 9 of 28 slices shown (2 of 2)]
[im 1/28]
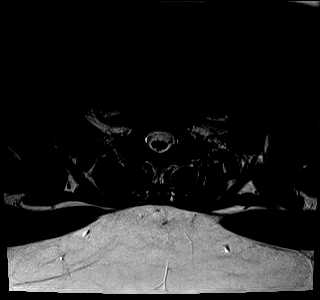
[im 5/28]
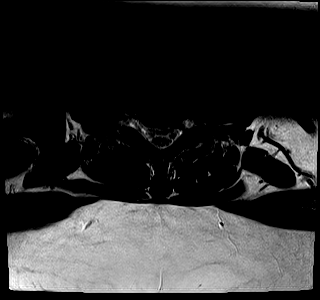
[im 8/28]
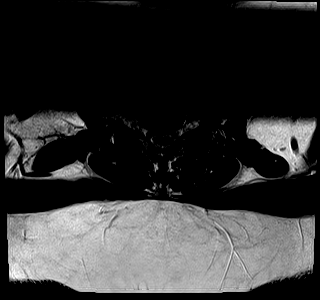
[im 13/28]
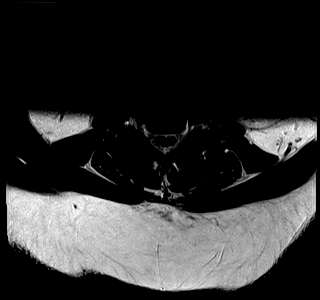
[im 15/28]
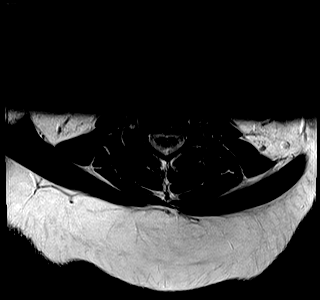
[im 20/28]
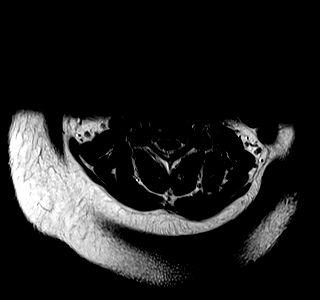
[im 23/28]
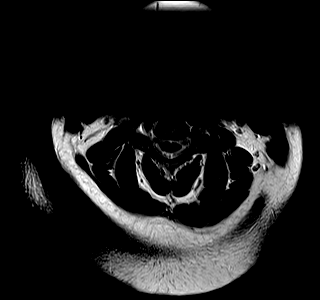
[im 25/28]
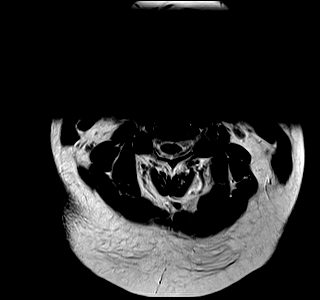
[im 28/28]
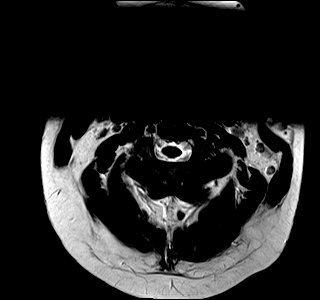

[Series 7: T1 · sagittal · 3.0mm · 0.55mm/px · 6 of 13 slices shown]
[im 1/13]
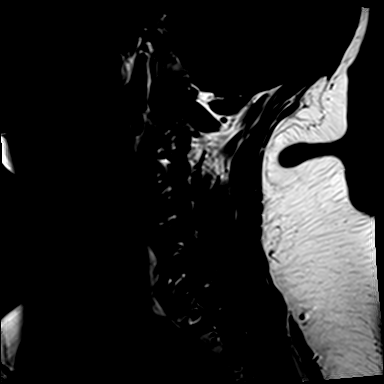
[im 3/13]
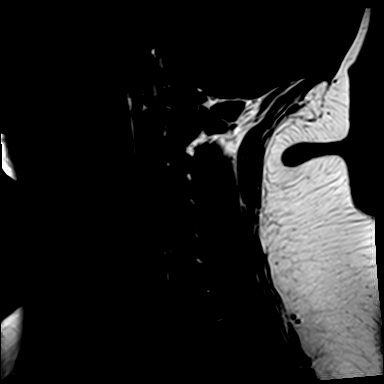
[im 5/13]
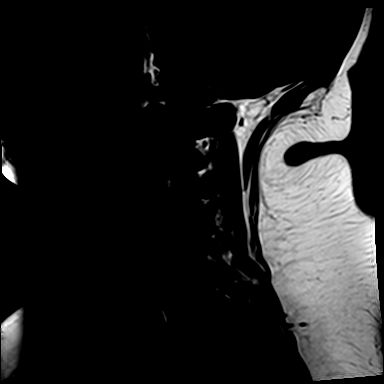
[im 8/13]
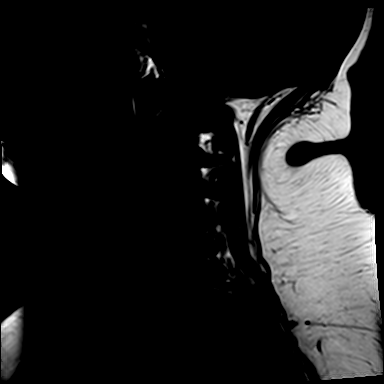
[im 10/13]
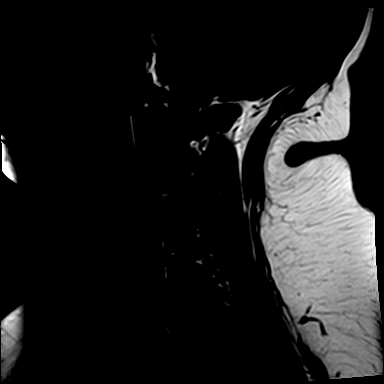
[im 13/13]
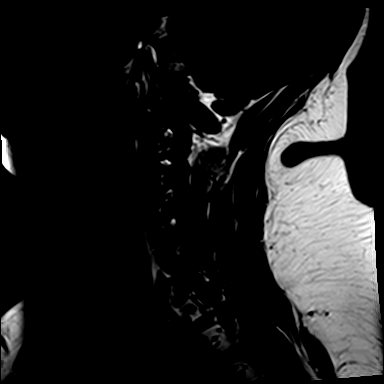

[33 of 48 positions shown; findings below may reference images not displayed]

FINDINGS: The patient was moving throughout the exam and there is moderate
motion degrading image quality.

Normal alignment with straightening of the cervical lordosis.
Negative for fracture or mass. No bone marrow or soft tissue edema
identified. Spinal cord appears normal.

Spinal canal normal in size. No significant spinal stenosis.
Negative for disc degeneration or spurring. Negative for disc
protrusion.
IMPRESSION: Image quality degraded by moderate motion.

No significant abnormality detected.

## 2017-03-13 ENCOUNTER — Encounter: Payer: Self-pay | Admitting: Family Medicine

## 2017-03-13 ENCOUNTER — Ambulatory Visit (INDEPENDENT_AMBULATORY_CARE_PROVIDER_SITE_OTHER): Payer: BLUE CROSS/BLUE SHIELD | Admitting: Family Medicine

## 2017-03-13 DIAGNOSIS — N938 Other specified abnormal uterine and vaginal bleeding: Secondary | ICD-10-CM

## 2017-03-13 NOTE — Progress Notes (Signed)
Ariana Parks is a 36 y.o. female who presents to [place]: [place] today for follow up menstrual bleeding.   Ariana Parks was seen last month for heavy menstrual bleeding thought to be due to anovulatory cycles. She was prescribed Provera. She had a heavy cycle after the 10 day Provera course ended. She denies any abdominal pain or current bleeding.  She denies any fevers or chills. Additionally she had an pelvic ultrasound in the interim which showed normal endometrial stripe thickness as well as a uterine fibroid. If she needs to follow-up with OB/GYN she would like to see Dr.Tomblin.   Past Medical History:  Diagnosis Date  . Abnormal Pap smear of cervix 2005   Mod dysplasia  . Depression   . GERD (gastroesophageal reflux disease)   . High cholesterol   . OSA (obstructive sleep apnea) 07/05/2016  . Panic attack   . PCOS (polycystic ovarian syndrome)    Past Surgical History:  Procedure Laterality Date  . KNEE SURGERY    . LEEP    . TONSILLECTOMY    . WISDOM TOOTH EXTRACTION     Social History  Substance Use Topics  . Smoking status: Current Every Day Smoker    Packs/day: 0.50    Years: 15.00    Types: Cigarettes  . Smokeless tobacco: Never Used     Comment: using vapes  . Alcohol use 0.0 oz/week   family history includes GER disease in her father; Heart attack in her other; Hypertension in her mother; Migraines in her father; Thyroid disease in her brother and mother.  ROS as above:  Medications: Current Outpatient Prescriptions  Medication Sig Dispense Refill  . Calcium Carbonate Antacid (ANTACID PO) Take by mouth.    Marland Kitchen LORazepam (ATIVAN) 0.5 MG tablet 1-2 tabs 30-60 mins prior to dental procedures 10 tablet 0  . medroxyPROGESTERone (PROVERA) 10 MG tablet 1 pill daily for 10 days every month for menstrual cycle. 10 tablet 3  . methocarbamol (ROBAXIN) 500  MG tablet Take 500 mg by mouth 4 (four) times daily.    . naproxen (NAPROSYN) 375 MG tablet Take 1 tablet (375 mg total) by mouth 2 (two) times daily. 20 tablet 0  . ranitidine (ZANTAC) 150 MG capsule Take 150 mg by mouth.    . Vilazodone HCl (VIIBRYD) 10 MG TABS Take 1 tablet (10 mg total) by mouth daily. 30 tablet 2   No current facility-administered medications for this visit.    Allergies  Allergen Reactions  . Celexa [Citalopram Hydrobromide]   . Ibuprofen     High doses  . Prednisone   . Prilosec [Omeprazole]   . Wellbutrin [Bupropion]   . Flexeril [Cyclobenzaprine] Other (See Comments)    Dreams    Health Maintenance Health Maintenance  Topic Date Due  . HIV Screening  08/20/1996  . TETANUS/TDAP  08/20/2000  . PAP SMEAR  08/09/2007  . INFLUENZA VACCINE  Completed     Exam:  BP 133/74   Pulse 92   Wt (!) 301 lb (136.5 kg)   BMI 44.13 kg/m  Gen: Well NAD HEENT: EOMI,  MMM Lungs: Normal work of breathing. CTABL Heart: RRR no MRG Abd: NABS, Soft. Nondistended, Nontender Exts: Brisk capillary refill, warm and well perfused.    Study Result   CLINICAL DATA:  Irregular bleeding for 2 months.  EXAM: TRANSABDOMINAL AND TRANSVAGINAL ULTRASOUND OF PELVIS  TECHNIQUE: Both transabdominal and transvaginal ultrasound examinations of the pelvis were  performed. Transabdominal technique was performed for global imaging of the pelvis including uterus, ovaries, adnexal regions, and pelvic cul-de-sac. It was necessary to proceed with endovaginal exam following the transabdominal exam to visualize the endometrium and ovaries.  COMPARISON:  None  FINDINGS: Uterus  Measurements: 8.5 x 4.6 x 5.8 cm. There is a 3.3 x 2.9 x 3.8 cm fibroid in the uterus. The uterus is otherwise heterogeneous with no other focal mass.  Endometrium  Thickness: 14.1 mm.  No focal abnormality visualized.  Right ovary  Not visualized.  Left ovary  Measurements: 3.4 x 2.2  x 3.0 cm. Normal appearance/no adnexal mass.  Other findings  Nabothian cysts are seen in the cervix with the largest measuring 8 mm.  IMPRESSION: 1. There is a single fibroid identified in the uterus. 2. The endometrium measures 14 mm which is normal for age.   Electronically Signed   By: Dorise Bullion III M.D   On: 02/14/2017 15:03      Assessment and Plan: 36 y.o. female with Dysfunctional uterine bleeding. Plan to continue this cycle of medroxyprogesterone. If her bleeding remains heavily will refer to OB/GYN. Plan for watchful waiting and recheck in the near future as needed.   No orders of the defined types were placed in this encounter.  No orders of the defined types were placed in this encounter.    Discussed warning signs or symptoms. Please see discharge instructions. Patient expresses understanding.  I spent 20 minutes with this patient, greater than 50% was face-to-face time counseling regarding ddx and treatment plan.

## 2017-03-13 NOTE — Patient Instructions (Addendum)
Thank you for coming in today. Continue the progesterin.  Let me know after the next cycle.  If if is bad again we will refer to Dr. Gertie Fey.  Recheck sooner if needed.   If your belly pain worsens, or you have high fever, bad vomiting, blood in your stool or black tarry stool go to the Emergency Room.     Dysfunctional Uterine Bleeding Dysfunctional uterine bleeding is abnormal bleeding from the uterus. Dysfunctional uterine bleeding includes:  A period that comes earlier or later than usual.  A period that is lighter, heavier, or has blood clots.  Bleeding between periods.  Skipping one or more periods.  Bleeding after sexual intercourse.  Bleeding after menopause.  Follow these instructions at home: Pay attention to any changes in your symptoms. Follow these instructions to help with your condition: Eating and drinking  Eat well-balanced meals. Include foods that are high in iron, such as liver, meat, shellfish, green leafy vegetables, and eggs.  If you become constipated: ? Drink plenty of water. ? Eat fruits and vegetables that are high in water and fiber, such as spinach, carrots, raspberries, apples, and mango. Medicines  Take over-the-counter and prescription medicines only as told by your health care provider.  Do not change medicines without talking with your health care provider.  Aspirin or medicines that contain aspirin may make the bleeding worse. Do not take those medicines: ? During the week before your period. ? During your period.  If you were prescribed iron pills, take them as told by your health care provider. Iron pills help to replace iron that your body loses because of this condition. Activity  If you need to change your sanitary pad or tampon more than one time every 2 hours: ? Lie in bed with your feet raised (elevated). ? Place a cold pack on your lower abdomen. ? Rest as much as possible until the bleeding stops or slows down.  Do not  try to lose weight until the bleeding has stopped and your blood iron level is back to normal. Other Instructions  For two months, write down: ? When your period starts. ? When your period ends. ? When any abnormal bleeding occurs. ? What problems you notice.  Keep all follow up visits as told by your health care provider. This is important. Contact a health care provider if:  You get light-headed or weak.  You have nausea and vomiting.  You cannot eat or drink without vomiting.  You feel dizzy or have diarrhea while you are taking medicines.  You are taking birth control pills or hormones, and you want to change them or stop taking them. Get help right away if:  You develop a fever or chills.  You need to change your sanitary pad or tampon more than one time per hour.  Your bleeding becomes heavier, or your flow contains clots more often.  You develop pain in your abdomen.  You lose consciousness.  You develop a rash. This information is not intended to replace advice given to you by your health care provider. Make sure you discuss any questions you have with your health care provider. Document Released: 06/08/2000 Document Revised: 11/17/2015 Document Reviewed: 09/06/2014 Elsevier Interactive Patient Education  [employer].

## 2017-03-14 DIAGNOSIS — N938 Other specified abnormal uterine and vaginal bleeding: Secondary | ICD-10-CM | POA: Insufficient documentation

## 2017-04-09 ENCOUNTER — Ambulatory Visit (INDEPENDENT_AMBULATORY_CARE_PROVIDER_SITE_OTHER): Payer: BLUE CROSS/BLUE SHIELD | Admitting: Family Medicine

## 2017-04-09 ENCOUNTER — Encounter: Payer: Self-pay | Admitting: Family Medicine

## 2017-04-09 VITALS — BP 133/85 | HR 83 | Temp 98.6°F | Wt 299.0 lb

## 2017-04-09 DIAGNOSIS — J0100 Acute maxillary sinusitis, unspecified: Secondary | ICD-10-CM | POA: Diagnosis not present

## 2017-04-09 DIAGNOSIS — J069 Acute upper respiratory infection, unspecified: Secondary | ICD-10-CM

## 2017-04-09 DIAGNOSIS — F419 Anxiety disorder, unspecified: Secondary | ICD-10-CM | POA: Diagnosis not present

## 2017-04-09 MED ORDER — BENZONATATE 200 MG PO CAPS: 200.0000 mg | ORAL_CAPSULE | Freq: Three times a day (TID) | ORAL | 0 refills | Status: DC | PRN

## 2017-04-09 MED ORDER — GUAIFENESIN-CODEINE 100-10 MG/5ML PO SOLN: 5.0000 mL | Freq: Every evening | ORAL | 0 refills | Status: DC | PRN

## 2017-04-09 MED ORDER — VILAZODONE HCL 10 MG PO TABS: 10.0000 mg | ORAL_TABLET | Freq: Every day | ORAL | 2 refills | Status: DC

## 2017-04-09 MED ORDER — METHOCARBAMOL 500 MG PO TABS: 500.0000 mg | ORAL_TABLET | Freq: Four times a day (QID) | ORAL | 0 refills | Status: DC

## 2017-04-09 MED ORDER — LORAZEPAM 0.5 MG PO TABS: ORAL_TABLET | ORAL | 0 refills | Status: DC

## 2017-04-09 NOTE — Progress Notes (Signed)
Ariana Parks is a 36 y.o. female who presents to [facility] today for sore throat. Patient notes sore throat cough and congestion associated with sinus pain and pressure. Symptoms present now for 3-4 days. She's tried occasional over-the-counter cough medications which do help some. She notes the cough is nonproductive but is very bothersome at night. She denies chest pain palpitations or certain as of breath. She's been exposed to a friend who was diagnosed with bronchitis.  Additionally patient notes continued anxiety symptoms. These are typically pretty well controlled. She notes that she has stopped her Viibryd out of inertia. She tolerated it pretty well at 10 mg daily. She notes that she's used Ativan occasionally for dental procedures. She has some upcoming dental procedures and would like a refill of possible.   Past Medical History:  Diagnosis Date  . Abnormal Pap smear of cervix 2005   Mod dysplasia  . Depression   . GERD (gastroesophageal reflux disease)   . High cholesterol   . OSA (obstructive sleep apnea) 07/05/2016  . Panic attack   . PCOS (polycystic ovarian syndrome)    Past Surgical History:  Procedure Laterality Date  . KNEE SURGERY    . LEEP    . TONSILLECTOMY    . WISDOM TOOTH EXTRACTION     Social History  Substance Use Topics  . Smoking status: Current Every Day Smoker    Packs/day: 0.50    Years: 15.00    Types: Cigarettes  . Smokeless tobacco: Never Used     Comment: using vapes  . Alcohol use 0.0 oz/week   family history includes GER disease in her father; Heart attack in her other; Hypertension in her mother; Migraines in her father; Thyroid disease in her brother and mother.  ROS as above:  Medications: Current Outpatient Prescriptions  Medication Sig Dispense Refill  . Calcium Carbonate Antacid (ANTACID PO) Take by mouth.     Marland Kitchen LORazepam (ATIVAN) 0.5 MG tablet 1-2 tabs 30-60 mins prior to dental procedures 10 tablet 0  . medroxyPROGESTERone (PROVERA) 10 MG tablet 1 pill daily for 10 days every month for menstrual cycle. 10 tablet 3  . methocarbamol (ROBAXIN) 500 MG tablet Take 1 tablet (500 mg total) by mouth 4 (four) times daily. 120 tablet 0  . naproxen (NAPROSYN) 375 MG tablet Take 1 tablet (375 mg total) by mouth 2 (two) times daily. 20 tablet 0  . ranitidine (ZANTAC) 150 MG capsule Take 150 mg by mouth.    . Vilazodone HCl (VIIBRYD) 10 MG TABS Take 1 tablet (10 mg total) by mouth daily. 30 tablet 2  . benzonatate (TESSALON) 200 MG capsule Take 1 capsule (200 mg total) by mouth 3 (three) times daily as needed for cough. 45 capsule 0  . guaiFENesin-codeine 100-10 MG/5ML syrup Take 5 mLs by mouth at bedtime as needed for cough. 120 mL 0   No current facility-administered medications for this visit.    Allergies  Allergen Reactions  . Celexa [Citalopram Hydrobromide]   . Ibuprofen     High doses  . Prednisone   . Prilosec [Omeprazole]   . Wellbutrin [Bupropion]   . Flexeril [Cyclobenzaprine] Other (See Comments)    Dreams    Health Maintenance Health Maintenance  Topic Date Due  . HIV Screening  08/20/1996  . TETANUS/TDAP  08/20/2000  . PAP SMEAR  08/09/2007  . INFLUENZA VACCINE  Completed     Exam:  BP 133/85  Pulse 83   Temp 98.6 F (37 C) (Oral)   Wt 299 lb (135.6 kg)   BMI 43.84 kg/m  Gen: Well NAD HEENT: EOMI,  MMM Posterior pharynx with cobblestoning. Normal tympanic membranes bilaterally. Nasal turbinates are inflamed bilaterally. Minimal cervical lymphadenopathy is present. Lungs: Normal work of breathing. CTABL Heart: RRR no MRG Abd: NABS, Soft. Nondistended, Nontender Exts: Brisk capillary refill, warm and well perfused.  Psych alert and oriented processandaffect.   No results found for this or any previous visit (from the past 72 hour(s)). No results  found.    Assessment and Plan: 36 y.o. female with viral URI with cough. Plan to treat symptomatically with over-the-counter medications as well as Tessalon Perles and codeine cough syrup.  As for anxiety will refill Viibryd and limited amount of Ativan. Warned about taking Ativan and codeine same time.  Recheck as needed  Patient researched [facility] Controlled Substance Reporting System.    No orders of the defined types were placed in this encounter.  Meds ordered this encounter  Medications  . methocarbamol (ROBAXIN) 500 MG tablet    Sig: Take 1 tablet (500 mg total) by mouth 4 (four) times daily.    Dispense:  120 tablet    Refill:  0  . LORazepam (ATIVAN) 0.5 MG tablet    Sig: 1-2 tabs 30-60 mins prior to dental procedures    Dispense:  10 tablet    Refill:  0  . Vilazodone HCl (VIIBRYD) 10 MG TABS    Sig: Take 1 tablet (10 mg total) by mouth daily.    Dispense:  30 tablet    Refill:  2  . benzonatate (TESSALON) 200 MG capsule    Sig: Take 1 capsule (200 mg total) by mouth 3 (three) times daily as needed for cough.    Dispense:  45 capsule    Refill:  0  . guaiFENesin-codeine 100-10 MG/5ML syrup    Sig: Take 5 mLs by mouth at bedtime as needed for cough.    Dispense:  120 mL    Refill:  0     Discussed warning signs or symptoms. Please see discharge instructions. Patient expresses understanding.

## 2017-04-09 NOTE — Patient Instructions (Addendum)
Thank you for coming in today. Take tesselan pearles for cough as needed.  Take codeine as needed for night time cough.  Use over the counter tylenol cough and cold medicine as needed.  Take ativan sparingly for severe anxiety.   Restart Viibryd.   Recheck with me in 3 moths to recheck anxiety and other health issues. .    Cough, Adult Coughing is a reflex that clears your throat and your airways. Coughing helps to heal and protect your lungs. It is normal to cough occasionally, but a cough that happens with other symptoms or lasts a long time may be a sign of a condition that needs treatment. A cough may last only 2-3 weeks (acute), or it may last longer than 8 weeks (chronic). What are the causes? Coughing is commonly caused by:  Breathing in substances that irritate your lungs.  A viral or bacterial respiratory infection.  Allergies.  Asthma.  Postnasal drip.  Smoking.  Acid backing up from the stomach into the esophagus (gastroesophageal reflux).  Certain medicines.  Chronic lung problems, including COPD (or rarely, lung cancer).  Other medical conditions such as heart failure.  Follow these instructions at home: Pay attention to any changes in your symptoms. Take these actions to help with your discomfort:  Take medicines only as told by your health care provider. ? If you were prescribed an antibiotic medicine, take it as told by your health care provider. Do not stop taking the antibiotic even if you start to feel better. ? Talk with your health care provider before you take a cough suppressant medicine.  Drink enough fluid to keep your urine clear or pale yellow.  If the air is dry, use a cold steam vaporizer or humidifier in your bedroom or your home to help loosen secretions.  Avoid anything that causes you to cough at work or at home.  If your cough is worse at night, try sleeping in a semi-upright position.  Avoid cigarette smoke. If you smoke, quit  smoking. If you need help quitting, ask your health care provider.  Avoid caffeine.  Avoid alcohol.  Rest as needed.  Contact a health care provider if:  You have new symptoms.  You cough up pus.  Your cough does not get better after 2-3 weeks, or your cough gets worse.  You cannot control your cough with suppressant medicines and you are losing sleep.  You develop pain that is getting worse or pain that is not controlled with pain medicines.  You have a fever.  You have unexplained weight loss.  You have night sweats. Get help right away if:  You cough up blood.  You have difficulty breathing.  Your heartbeat is very fast. This information is not intended to replace advice given to you by your health care provider. Make sure you discuss any questions you have with your health care provider. Document Released: 12/08/2010 Document Revised: 11/17/2015 Document Reviewed: 08/18/2014 Elsevier Interactive Patient Education  2017 [employer].

## 2017-05-15 ENCOUNTER — Emergency Department (INDEPENDENT_AMBULATORY_CARE_PROVIDER_SITE_OTHER)
Admission: EM | Admit: 2017-05-15 | Discharge: 2017-05-15 | Disposition: A | Discharge status: Home / Self Care | Payer: BLUE CROSS/BLUE SHIELD | Source: Home / Self Care | Attending: Family Medicine | Admitting: Family Medicine

## 2017-05-15 ENCOUNTER — Other Ambulatory Visit: Payer: Self-pay

## 2017-05-15 DIAGNOSIS — J020 Streptococcal pharyngitis: Secondary | ICD-10-CM | POA: Diagnosis not present

## 2017-05-15 LAB — POCT RAPID STREP A (OFFICE): Rapid Strep A Screen: POSITIVE — AB

## 2017-05-15 MED ORDER — AMOXICILLIN 500 MG PO CAPS: 500.0000 mg | ORAL_CAPSULE | Freq: Two times a day (BID) | ORAL | 0 refills | Status: DC

## 2017-05-15 NOTE — Discharge Instructions (Signed)
°  Please take antibiotics as prescribed and be sure to complete entire course even if you start to feel better to ensure infection does not come back.  You may take 500mg  acetaminophen every 4-6 hours as needed for pain, inflammation, and fever.  Be sure to drink at least eight 8oz glasses of water to stay well hydrated and get at least 8 hours of sleep at night, preferably more while sick.

## 2017-05-15 NOTE — ED Triage Notes (Signed)
Seen by Dr. Georgina Snell 1 month ago for a cold.  Still has non productive cough, and sore throat last 2 days.  Painful to swallow.

## 2017-05-15 NOTE — ED Provider Notes (Signed)
[facility]    CSN: [id number] Arrival date & time: 05/15/17  1459     History   Chief Complaint Chief Complaint  Patient presents with  . Sore Throat    HPI Ariana Parks is a 36 y.o. female.   HPI  Ariana Parks is a 36 y.o. female presenting to UC with c/o cold-like symptoms with mild congestion, cough and sore throat for about 1 month. She was seen by her PCP, Dr. Georgina Snell, on 04/09/17, dx with a viral illness and encouraged to treat symptomatically.  Cough and congestion have improved but throat has worsened. Pain is 4/10 but worse with swallowing. Denies fever, chills, n/v/d. No known sick contacts recently but states she was around someone who was dx with bronchitis when symptoms initially started.    Past Medical History:  Diagnosis Date  . Abnormal Pap smear of cervix 2005   Mod dysplasia  . Depression   . GERD (gastroesophageal reflux disease)   . High cholesterol   . OSA (obstructive sleep apnea) 07/05/2016  . Panic attack   . PCOS (polycystic ovarian syndrome)     Patient Active Problem List   Diagnosis Date Noted  . DUB (dysfunctional uterine bleeding) 03/14/2017  . Prediabetes 02/14/2017  . OSA (obstructive sleep apnea) 07/05/2016  . Morbid obesity ([place]) 07/05/2016  . Left knee pain 09/29/2015  . PCOS (polycystic ovarian syndrome) 05/31/2015  . Abnormal weight gain 03/10/2015  . Abnormal EEG 01/02/2012  . ADD (attention deficit disorder) 01/02/2012  . Anxiety 01/02/2012  . Clinical depression 01/02/2012  . Current tobacco use 01/02/2012    Past Surgical History:  Procedure Laterality Date  . KNEE SURGERY    . LEEP    . TONSILLECTOMY    . WISDOM TOOTH EXTRACTION      OB History    Gravida Para Term Preterm AB Living   0 0 0 0 0 0   SAB TAB Ectopic Multiple Live Births   0 0 0 0         Home Medications    Prior to Admission medications   Medication Sig Start Date End Date Taking? Authorizing Provider  amoxicillin  (AMOXIL) 500 MG capsule Take 1 capsule (500 mg total) by mouth 2 (two) times daily. For 10 days 05/15/17   Noe Gens, PA-C  benzonatate (TESSALON) 200 MG capsule Take 1 capsule (200 mg total) by mouth 3 (three) times daily as needed for cough. 04/09/17   Gregor Hams, MD  Calcium Carbonate Antacid (ANTACID PO) Take by mouth.    [provider]  guaiFENesin-codeine 100-10 MG/5ML syrup Take 5 mLs by mouth at bedtime as needed for cough. 04/09/17   Gregor Hams, MD  LORazepam (ATIVAN) 0.5 MG tablet 1-2 tabs 30-60 mins prior to dental procedures 04/09/17   Gregor Hams, MD  medroxyPROGESTERone (PROVERA) 10 MG tablet 1 pill daily for 10 days every month for menstrual cycle. 02/13/17   Gregor Hams, MD  methocarbamol (ROBAXIN) 500 MG tablet Take 1 tablet (500 mg total) by mouth 4 (four) times daily. 04/09/17   Gregor Hams, MD  naproxen (NAPROSYN) 375 MG tablet Take 1 tablet (375 mg total) by mouth 2 (two) times daily. 08/18/16   Noe Gens, PA-C  ranitidine (ZANTAC) 150 MG capsule Take 150 mg by mouth.    [provider]  Vilazodone HCl (VIIBRYD) 10 MG TABS Take 1 tablet (10 mg total) by mouth daily. 04/09/17   Gregor Hams,  MD    Family History Family History  Problem Relation Age of Onset  . Hypertension Mother   . Thyroid disease Mother   . GER disease Father   . Migraines Father   . Heart attack Other   . Thyroid disease Brother     Social History Social History   Tobacco Use  . Smoking status: Current Every Day Smoker    Packs/day: 0.50    Years: 15.00    Pack years: 7.50    Types: Cigarettes  . Smokeless tobacco: Never Used  . Tobacco comment: using vapes  Substance Use Topics  . Alcohol use: Yes    Alcohol/week: 0.0 oz  . Drug use: No     Allergies   Celexa [citalopram hydrobromide]; Ibuprofen; Prednisone; Prilosec [omeprazole]; Wellbutrin [bupropion]; and Flexeril [cyclobenzaprine]   Review of Systems Review of Systems  Constitutional:  Negative for chills and fever.  HENT: Positive for congestion, sinus pressure and sore throat. Negative for ear pain, sinus pain, trouble swallowing and voice change.   Respiratory: Positive for cough. Negative for shortness of breath.   Cardiovascular: Negative for chest pain and palpitations.  Gastrointestinal: Negative for abdominal pain, diarrhea, nausea and vomiting.  Musculoskeletal: Negative for arthralgias, back pain and myalgias.  Skin: Negative for rash.  Neurological: Negative for dizziness, light-headedness and headaches.     Physical Exam Triage Vital Signs ED Triage Vitals [05/15/17 1519]  Enc Vitals Group     BP 128/80     Pulse Rate 64     Resp      Temp 97.6 F (36.4 C)     Temp Source Oral     SpO2 96 %     Weight      Height      Head Circumference      Peak Flow      Pain Score      Pain Loc      Pain Edu?      Excl. in [place]?    No data found.  Updated Vital Signs BP 128/80 (BP Location: Right Arm)   Pulse 64   Temp 97.6 F (36.4 C) (Oral)   Ht 5' 9.5" (1.765 m)   Wt 294 lb (133.4 kg)   LMP 05/15/2017   SpO2 96%   BMI 42.79 kg/m   Visual Acuity Right Eye Distance:   Left Eye Distance:   Bilateral Distance:    Right Eye Near:   Left Eye Near:    Bilateral Near:     Physical Exam  Constitutional: She is oriented to person, place, and time. She appears well-developed and well-nourished.  Non-toxic appearance. She does not appear ill. No distress.  HENT:  Head: Normocephalic and atraumatic.  Right Ear: Tympanic membrane normal.  Left Ear: Tympanic membrane normal.  Nose: Nose normal.  Mouth/Throat: Uvula is midline and mucous membranes are normal. Posterior oropharyngeal erythema present. No oropharyngeal exudate, posterior oropharyngeal edema or tonsillar abscesses.  Eyes: EOM are normal.  Neck: Normal range of motion.  Cardiovascular: Normal rate and regular rhythm.  Pulmonary/Chest: Effort normal and breath sounds normal. No stridor.  She has no wheezes. She has no rhonchi.  Musculoskeletal: Normal range of motion.  Neurological: She is alert and oriented to person, place, and time.  Skin: Skin is warm and dry.  Psychiatric: She has a normal mood and affect. Her behavior is normal.  Nursing note and vitals reviewed.    UC Treatments / Results  Labs (all labs ordered are  listed, but only abnormal results are displayed) Labs Reviewed  POCT RAPID STREP A (OFFICE) - Abnormal; Notable for the following components:      Result Value   Rapid Strep A Screen Positive (*)    All other components within normal limits    EKG  EKG Interpretation None       Radiology No results found.  Procedures Procedures (including critical care time)  Medications Ordered in UC Medications - No data to display   Initial Impression / Assessment and Plan / UC Course  I have reviewed the triage vital signs and the nursing notes.  Pertinent labs & imaging results that were available during my care of the patient were reviewed by me and considered in my medical decision making (see chart for details).     Rapid strep: POSITIVE Will treat with amoxicillin Encouraged fluids and rest F/u with PCP in 1 week if not improving.   Final Clinical Impressions(s) / UC Diagnoses   Final diagnoses:  Strep throat    ED Discharge Orders        Ordered    amoxicillin (AMOXIL) 500 MG capsule  2 times daily     05/15/17 1529       Controlled Substance Prescriptions [place] Controlled Substance Registry consulted? Not Applicable   Tyrell Antonio 05/15/17 1554

## 2017-07-10 ENCOUNTER — Ambulatory Visit: Payer: Self-pay | Admitting: Family Medicine

## 2017-07-15 ENCOUNTER — Encounter: Payer: Self-pay | Admitting: Family Medicine

## 2017-07-15 ENCOUNTER — Ambulatory Visit (INDEPENDENT_AMBULATORY_CARE_PROVIDER_SITE_OTHER): Payer: Managed Care, Other (non HMO) | Admitting: Family Medicine

## 2017-07-15 VITALS — BP 132/79 | HR 81 | Ht 69.0 in | Wt 296.0 lb

## 2017-07-15 DIAGNOSIS — R7303 Prediabetes: Secondary | ICD-10-CM

## 2017-07-15 DIAGNOSIS — F332 Major depressive disorder, recurrent severe without psychotic features: Secondary | ICD-10-CM | POA: Diagnosis not present

## 2017-07-15 DIAGNOSIS — E282 Polycystic ovarian syndrome: Secondary | ICD-10-CM

## 2017-07-15 DIAGNOSIS — F419 Anxiety disorder, unspecified: Secondary | ICD-10-CM | POA: Diagnosis not present

## 2017-07-15 DIAGNOSIS — Z720 Tobacco use: Secondary | ICD-10-CM

## 2017-07-15 DIAGNOSIS — N938 Other specified abnormal uterine and vaginal bleeding: Secondary | ICD-10-CM

## 2017-07-15 MED ORDER — LORAZEPAM 0.5 MG PO TABS: ORAL_TABLET | ORAL | 0 refills | Status: DC

## 2017-07-15 MED ORDER — METFORMIN HCL 500 MG PO TABS: 500.0000 mg | ORAL_TABLET | Freq: Two times a day (BID) | ORAL | 1 refills | Status: DC

## 2017-07-15 NOTE — Patient Instructions (Addendum)
Thank you for coming in today. You should hear from Dr Tomblin's office soon.  You can also call yourself.  Start metformin once daily and increase to twice daily.  Start prenatal vitamins or just Folic Acid OTC.   Work on quitting smoking. This is good for you and your future baby.  You can use patches 14mg  or gum or lozanges or both.   Recheck as needed.   You may get pregnant with metformin.  If you don't want to get pregnant right now consider condoms.    Polycystic Ovarian Syndrome Polycystic ovarian syndrome (PCOS) is a common hormonal disorder among women of reproductive age. In most women with PCOS, many small fluid-filled sacs (cysts) grow on the ovaries, and the cysts are not part of a normal menstrual cycle. PCOS can cause problems with your menstrual periods and make it difficult to get pregnant. It can also cause an increased risk of miscarriage with pregnancy. If it is not treated, PCOS can lead to serious health problems, such as diabetes and heart disease. What are the causes? The cause of PCOS is not known, but it may be the result of a combination of certain factors, such as:  Irregular menstrual cycle.  High levels of certain hormones (androgens).  Problems with the hormone that helps to control blood sugar (insulin resistance).  Certain genes.  What increases the risk? This condition is more likely to develop in women who have a family history of PCOS. What are the signs or symptoms? Symptoms of PCOS may include:  Multiple ovarian cysts.  Infrequent periods or no periods.  Periods that are too frequent or too heavy.  Unpredictable periods.  Inability to get pregnant (infertility) because of not ovulating.  Increased growth of hair on the face, chest, stomach, back, thumbs, thighs, or toes.  Acne or oily skin. Acne may develop during adulthood, and it may not respond to treatment.  Pelvic pain.  Weight gain or obesity.  Patches of thickened and  dark brown or black skin on the neck, arms, breasts, or thighs (acanthosis nigricans).  Excess hair growth on the face, chest, abdomen, or upper thighs (hirsutism).  How is this diagnosed? This condition is diagnosed based on:  Your medical history.  A physical exam, including a pelvic exam. Your health care provider may look for areas of increased hair growth on your skin.  Tests, such as: ? Ultrasound. This may be used to examine the ovaries and the lining of the uterus (endometrium) for cysts. ? Blood tests. These may be used to check levels of sugar (glucose), female hormone (testosterone), and female hormones (estrogen and progesterone) in your blood.  How is this treated? There is no cure for PCOS, but treatment can help to manage symptoms and prevent more health problems from developing. Treatment varies depending on:  Your symptoms.  Whether you want to have a baby or whether you need birth control (contraception).  Treatment may include nutrition and lifestyle changes along with:  Progesterone hormone to start a menstrual period.  Birth control pills to help you have regular menstrual periods.  Medicines to make you ovulate, if you want to get pregnant.  Medicine to reduce excessive hair growth.  Surgery, in severe cases. This may involve making small holes in one or both of your ovaries. This decreases the amount of testosterone that your body produces.  Follow these instructions at home:  Take over-the-counter and prescription medicines only as told by your health care provider.  Follow  a healthy meal plan. This can help you reduce the effects of PCOS. ? Eat a healthy diet that includes lean proteins, complex carbohydrates, fresh fruits and vegetables, low-fat dairy products, and healthy fats. Make sure to eat enough fiber.  If you are overweight, lose weight as told by your health care provider. ? Losing 10% of your body weight may improve symptoms. ? Your health  care provider can determine how much weight loss is best for you and can help you lose weight safely.  Keep all follow-up visits as told by your health care provider. This is important. Contact a health care provider if:  Your symptoms do not get better with medicine.  You develop new symptoms. This information is not intended to replace advice given to you by your health care provider. Make sure you discuss any questions you have with your health care provider. Document Released: 10/05/2004 Document Revised: 02/07/2016 Document Reviewed: 11/27/2015 Elsevier Interactive Patient Education  2018 [employer].     Diet for Polycystic Ovarian Syndrome Polycystic ovary syndrome (PCOS) is a disorder of the chemical messengers (hormones) that regulate menstruation. The condition causes important hormones to be out of balance. PCOS can:  Make your periods irregular or stop.  Cause cysts to develop on the ovaries.  Make it difficult to get pregnant.  Stop your body from responding to the effects of insulin (insulin resistance), which can lead to obesity and diabetes.  Changing what you eat can help manage PCOS and improve your health. It can help you lose weight and improve the way your body uses insulin. What is my plan?  Eat breakfast, lunch, and dinner plus two snacks every day.  Include protein in each meal and snack.  Choose whole grains instead of products made with refined flour.  Eat a variety of foods.  Exercise regularly as told by your health care provider. What do I need to know about this eating plan? If you are overweight or obese, pay attention to how many calories you eat. Cutting down on calories can help you lose weight. Work with your health care provider or dietitian to figure out how many calories you need each day. What foods can I eat? Grains Whole grains, such as whole wheat. Whole-grain breads, crackers, cereals, and pasta. Unsweetened oatmeal, bulgur,  barley, quinoa, or brown rice. Corn or whole-wheat flour tortillas. Vegetables  Lettuce. Spinach. Peas. Beets. Cauliflower. Cabbage. Broccoli. Carrots. Tomatoes. Squash. Eggplant. Herbs. Peppers. Onions. Cucumbers. Brussels sprouts. Fruits Berries. Bananas. Apples. Oranges. Grapes. Papaya. Mango. Pomegranate. Kiwi. Grapefruit. Cherries. Meats and Other Protein Sources Lean proteins, such as fish, chicken, beans, eggs, and tofu. Dairy Low-fat dairy products, such as skim milk, cheese sticks, and yogurt. Beverages Low-fat or fat-free drinks, such as water, low-fat milk, sugar-free drinks, and 100% fruit juice. Condiments Ketchup. Mustard. Barbecue sauce. Relish. Low-fat or fat-free mayonnaise. Fats and Oils Olive oil or canola oil. Walnuts and almonds. The items listed above may not be a complete list of recommended foods or beverages. Contact your dietitian for more options. What foods are not recommended? Foods high in calories or fat. Fried foods. Sweets. Products made from refined white flour, including white bread, pastries, white rice, and pasta. The items listed above may not be a complete list of foods and beverages to avoid. Contact your dietitian for more information. This information is not intended to replace advice given to you by your health care provider. Make sure you discuss any questions you have with your health care  provider. Document Released: 10/03/2015 Document Revised: 11/17/2015 Document Reviewed: 06/23/2014 Elsevier Interactive Patient Education  2018 [employer].

## 2017-07-15 NOTE — Progress Notes (Signed)
Ariana Parks is a 37 y.o. female who presents to [facility] today for heavy vaginal bleeding, infertility/PCO S, anxiety.  Ariana Parks has a history of heavy vaginal bleeding worked up thought to be due to fibroids and dysfunctional uterine bleeding.  She was prescribed Provera which worked well.  She notes that her irregular bleeding has resolved however she continues to have painful heavy vaginal bleeding.  This is complicated by her desire to potentially become pregnant in the future.  She recently got engaged and wonders about her ability to become pregnant.  PCO S/infertility: Ariana Parks was diagnosed with polycystic ovarian syndrome as an adolescent and young adult and told that she would not be able to become pregnant.  Now that she is engaged she is interested in potential fertility in the future.  Anxiety: Ariana Parks notes that her depression and anxiety symptoms have improved recently.  She no longer takes Viibryd.  She will occasionally take Ativan approximately once or twice per month for panic attacks.  Ariana Parks continues to smoke about a half a pack a day.   Past Medical History:  Diagnosis Date  . Abnormal Pap smear of cervix 2005   Mod dysplasia  . Depression   . GERD (gastroesophageal reflux disease)   . High cholesterol   . OSA (obstructive sleep apnea) 07/05/2016  . Panic attack   . PCOS (polycystic ovarian syndrome)    Past Surgical History:  Procedure Laterality Date  . KNEE SURGERY    . LEEP    . TONSILLECTOMY    . WISDOM TOOTH EXTRACTION     Social History   Tobacco Use  . Smoking status: Current Every Day Smoker    Packs/day: 0.50    Years: 15.00    Pack years: 7.50    Types: Cigarettes  . Smokeless tobacco: Never Used  . Tobacco Parks: using vapes  Substance Use Topics  . Alcohol use: Yes    Alcohol/week: 0.0 oz   family history  includes GER disease in her father; Heart attack in her other; Hypertension in her mother; Migraines in her father; Thyroid disease in her brother and mother.  ROS as above:  Medications: Current Outpatient Medications  Medication Sig Dispense Refill  . Calcium Carbonate Antacid (ANTACID PO) Take by mouth.    Marland Kitchen LORazepam (ATIVAN) 0.5 MG tablet 1-2 tabs 30-60 mins prior to dental procedures 10 tablet 0  . medroxyPROGESTERone (PROVERA) 10 MG tablet 1 pill daily for 10 days every month for menstrual cycle. 10 tablet 3  . methocarbamol (ROBAXIN) 500 MG tablet Take 1 tablet (500 mg total) by mouth 4 (four) times daily. 120 tablet 0  . naproxen (NAPROSYN) 375 MG tablet Take 1 tablet (375 mg total) by mouth 2 (two) times daily. 20 tablet 0  . ranitidine (ZANTAC) 150 MG capsule Take 150 mg by mouth.    . Vilazodone HCl (VIIBRYD) 10 MG TABS Take 1 tablet (10 mg total) by mouth daily. 30 tablet 2  . metFORMIN (GLUCOPHAGE) 500 MG tablet Take 1 tablet (500 mg total) by mouth 2 (two) times daily with a meal. 180 tablet 1   No current facility-administered medications for this visit.    Allergies  Allergen Reactions  . Celexa [Citalopram Hydrobromide]   . Ibuprofen     High doses  . Prednisone   . Prilosec [Omeprazole]   . Wellbutrin [Bupropion]   . Flexeril [Cyclobenzaprine] Other (See Comments)    Dreams  Health Maintenance Health Maintenance  Topic Date Due  . HIV Screening  08/20/1996  . TETANUS/TDAP  08/20/2000  . PAP SMEAR  08/09/2007  . INFLUENZA VACCINE  Completed     Exam:  BP 132/79   Pulse 81   Ht 5\' 9"  (1.753 m)   Wt 296 lb (134.3 kg)   BMI 43.71 kg/m  Gen: Well NAD obese HEENT: EOMI,  MMM Lungs: Normal work of breathing. CTABL Heart: RRR no MRG Abd: NABS, Soft. Nondistended, Nontender Exts: Brisk capillary refill, warm and well perfused.  Psych: Alert and oriented normal speech thought process and affect.   No results found for this or any previous visit (from  the past 72 hour(s)). No results found.    Assessment and Plan: 37 y.o. female with  Painful heavy bleeding: Likely due to fibroid seen with prior ultrasound.  I think given her desire to become pregnant in the future I think is reasonable to follow-up with OB/GYN to discuss her options further.  Referral to OB/GYN placed.  PCOS/infertility: We had a lengthy discussion about this.  Weight loss and certainly can be helpful.  Additionally smoking cessation is going to be helpful in general for potential fertility and health.  I think is reasonable to start low-dose metformin now and follow-up with OB/GYN as noted above.  I recommended prenatal vitamins.  Anxiety: Doing reasonably well continue current regimen recheck in the near future.  Smoking: Discussed tobacco cessation options   Orders Placed This Encounter  Procedures  . Ambulatory referral to Obstetrics / Gynecology    Referral Priority:   Routine    Referral Type:   Consultation    Referral Reason:   Specialty Services Required    Referred to Provider:   Everlene Farrier, MD    Requested Specialty:   Obstetrics and Gynecology    Number of Visits Requested:   1   Meds ordered this encounter  Medications  . LORazepam (ATIVAN) 0.5 MG tablet    Sig: 1-2 tabs 30-60 mins prior to dental procedures    Dispense:  10 tablet    Refill:  0  . metFORMIN (GLUCOPHAGE) 500 MG tablet    Sig: Take 1 tablet (500 mg total) by mouth 2 (two) times daily with a meal.    Dispense:  180 tablet    Refill:  1     Discussed warning signs or symptoms. Please see discharge instructions. Patient expresses understanding.

## 2017-11-08 ENCOUNTER — Other Ambulatory Visit: Payer: Self-pay | Admitting: Family Medicine

## 2017-11-08 DIAGNOSIS — N926 Irregular menstruation, unspecified: Secondary | ICD-10-CM

## 2017-11-09 ENCOUNTER — Emergency Department (INDEPENDENT_AMBULATORY_CARE_PROVIDER_SITE_OTHER)
Admission: EM | Admit: 2017-11-09 | Discharge: 2017-11-09 | Disposition: A | Discharge status: Home / Self Care | Payer: Managed Care, Other (non HMO) | Source: Home / Self Care | Attending: Family Medicine | Admitting: Family Medicine

## 2017-11-09 ENCOUNTER — Encounter: Payer: Self-pay | Admitting: Emergency Medicine

## 2017-11-09 DIAGNOSIS — K921 Melena: Secondary | ICD-10-CM

## 2017-11-09 DIAGNOSIS — R1032 Left lower quadrant pain: Secondary | ICD-10-CM | POA: Diagnosis not present

## 2017-11-09 DIAGNOSIS — K529 Noninfective gastroenteritis and colitis, unspecified: Secondary | ICD-10-CM

## 2017-11-09 MED ORDER — CIPROFLOXACIN HCL 500 MG PO TABS: 500.0000 mg | ORAL_TABLET | Freq: Two times a day (BID) | ORAL | 0 refills | Status: DC

## 2017-11-09 MED ORDER — METRONIDAZOLE 500 MG PO TABS: 500.0000 mg | ORAL_TABLET | Freq: Three times a day (TID) | ORAL | 0 refills | Status: DC

## 2017-11-09 NOTE — Discharge Instructions (Addendum)
°  Cipro is an antibiotic that is used to treat diverticulitis.  Please take as prescribed.  If you develop muscle or joint pain, please stop taking this medication immediately and call our office to be prescribed a different medication.    Do not drink alcohol while taking metronidazole (Flagyl) as it may cause severe nausea and vomiting.

## 2017-11-09 NOTE — ED Triage Notes (Signed)
Patient presents to [facility] with C/O pain in abdominal mid to lower since 5 am. Denies nausea, vomiting or diarrhea, rectal bleeding small amount time 3 today. Patient rates pain 1-2/10 worse at times.

## 2017-11-09 NOTE — ED Provider Notes (Signed)
[facility]    CSN: [id number] Arrival date & time: 11/09/17  1747     History   Chief Complaint Chief Complaint  Patient presents with  . Abdominal Cramping  . Abdominal Pain  . Rectal Bleeding    HPI Ariana Parks is a 37 y.o. female.   HPI  Ariana Parks is a 37 y.o. female presenting to UC with c/o lower abdominal pain that has been waxing and waning since about 5AM.  Pain is cramping in nature, 1-2/10, associated frequent soft stools and 3 episodes of small amount of red blood after BMs.  Denies rectal pain.  Denies n/v/d. Denies fever or chills. Hx of similar symptoms a few years ago, her mother believes was diverticulitis but she was never seen and symptoms eventually resolved on their own after pt stuck to a clear liquid diet.  Pt has been drinking just clear liquids today but no relief.  She has not taken any OTC medications.  Denies urinary symptoms or back pain.     Past Medical History:  Diagnosis Date  . Abnormal Pap smear of cervix 2005   Mod dysplasia  . Depression   . GERD (gastroesophageal reflux disease)   . High cholesterol   . OSA (obstructive sleep apnea) 07/05/2016  . Panic attack   . PCOS (polycystic ovarian syndrome)     Patient Active Problem List   Diagnosis Date Noted  . DUB (dysfunctional uterine bleeding) 03/14/2017  . Prediabetes 02/14/2017  . OSA (obstructive sleep apnea) 07/05/2016  . Morbid obesity ([place]) 07/05/2016  . Left knee pain 09/29/2015  . PCOS (polycystic ovarian syndrome) 05/31/2015  . Abnormal weight gain 03/10/2015  . Abnormal EEG 01/02/2012  . ADD (attention deficit disorder) 01/02/2012  . Anxiety 01/02/2012  . Clinical depression 01/02/2012  . Current tobacco use 01/02/2012    Past Surgical History:  Procedure Laterality Date  . KNEE SURGERY    . LEEP    . TONSILLECTOMY    . WISDOM TOOTH EXTRACTION      OB History    Gravida  0   Para  0   Term  0   Preterm  0   AB  0   Living  0     SAB  0   TAB  0   Ectopic  0   Multiple  0   Live Births               Home Medications    Prior to Admission medications   Medication Sig Start Date End Date Taking? Authorizing Provider  Calcium Carbonate Antacid (ANTACID PO) Take by mouth.    [provider]  ciprofloxacin (CIPRO) 500 MG tablet Take 1 tablet (500 mg total) by mouth 2 (two) times daily. One po bid x 7 days 11/09/17   Noe Gens, PA-C  LORazepam (ATIVAN) 0.5 MG tablet 1-2 tabs 30-60 mins prior to dental procedures 07/15/17   Gregor Hams, MD  medroxyPROGESTERone (PROVERA) 10 MG tablet 1 pill daily for 10 days every month for menstrual cycle. 02/13/17   Gregor Hams, MD  metFORMIN (GLUCOPHAGE) 500 MG tablet Take 1 tablet (500 mg total) by mouth 2 (two) times daily with a meal. 07/15/17   Gregor Hams, MD  methocarbamol (ROBAXIN) 500 MG tablet Take 1 tablet (500 mg total) by mouth 4 (four) times daily. 04/09/17   Gregor Hams, MD  metroNIDAZOLE (FLAGYL) 500 MG tablet Take 1 tablet (500 mg total) by mouth  3 (three) times daily. 11/09/17   Noe Gens, PA-C  naproxen (NAPROSYN) 375 MG tablet Take 1 tablet (375 mg total) by mouth 2 (two) times daily. 08/18/16   Noe Gens, PA-C  ranitidine (ZANTAC) 150 MG capsule Take 150 mg by mouth.    [provider]  Vilazodone HCl (VIIBRYD) 10 MG TABS Take 1 tablet (10 mg total) by mouth daily. 04/09/17   Gregor Hams, MD    Family History Family History  Problem Relation Age of Onset  . Hypertension Mother   . Thyroid disease Mother   . GER disease Father   . Migraines Father   . Heart attack Other   . Thyroid disease Brother     Social History Social History   Tobacco Use  . Smoking status: Current Every Day Smoker    Packs/day: 0.50    Years: 15.00    Pack years: 7.50    Types: Cigarettes  . Smokeless tobacco: Never Used  . Tobacco comment: using vapes  Substance Use Topics  . Alcohol use: Yes    Alcohol/week: 0.0 oz  . Drug  use: No     Allergies   Celexa [citalopram hydrobromide]; Ibuprofen; Prednisone; Prilosec [omeprazole]; Wellbutrin [bupropion]; and Flexeril [cyclobenzaprine]   Review of Systems Review of Systems  Constitutional: Negative for appetite change, chills and fever.  Gastrointestinal: Positive for abdominal pain and blood in stool. Negative for diarrhea, nausea and vomiting.  Genitourinary: Negative for dysuria, frequency and hematuria.  Musculoskeletal: Negative for back pain and myalgias.     Physical Exam Triage Vital Signs ED Triage Vitals  Enc Vitals Group     BP 11/09/17 1817 130/82     Pulse Rate 11/09/17 1817 91     Resp 11/09/17 1817 16     Temp 11/09/17 1817 98.4 F (36.9 C)     Temp Source 11/09/17 1817 Oral     SpO2 11/09/17 1817 98 %     Weight 11/09/17 1819 (!) 309 lb 12 oz (140.5 kg)     Height 11/09/17 1819 5' 9.5" (1.765 m)     Head Circumference --      Peak Flow --      Pain Score 11/09/17 1818 2     Pain Loc --      Pain Edu? --      Excl. in [place]? --    No data found.  Updated Vital Signs BP 130/82 (BP Location: Right Arm)   Pulse 91   Temp 98.4 F (36.9 C) (Oral)   Resp 16   Ht 5' 9.5" (1.765 m)   Wt (!) 309 lb 12 oz (140.5 kg)   LMP 09/23/2017   SpO2 98%   BMI 45.09 kg/m   Visual Acuity Right Eye Distance:   Left Eye Distance:   Bilateral Distance:    Right Eye Near:   Left Eye Near:    Bilateral Near:     Physical Exam  Constitutional: She is oriented to person, place, and time. She appears well-developed and well-nourished.  Non-toxic appearance. She does not appear ill. No distress.  Pt sitting on exam bed, NAD  HENT:  Head: Normocephalic and atraumatic.  Mouth/Throat: Oropharynx is clear and moist.  Eyes: EOM are normal.  Neck: Normal range of motion.  Cardiovascular: Normal rate and regular rhythm.  Pulmonary/Chest: Effort normal and breath sounds normal.  Abdominal: Soft. Normal appearance and bowel sounds are normal. There  is tenderness in the suprapubic area and left  lower quadrant. There is no rigidity, no rebound, no guarding, no CVA tenderness, no tenderness at McBurney's point and negative Murphy's sign.  Obese abdomen, soft, mild diffuse tenderness across lower abdomen. No focal tenderness.   Musculoskeletal: Normal range of motion.  Neurological: She is alert and oriented to person, place, and time.  Skin: Skin is warm and dry.  Psychiatric: She has a normal mood and affect. Her behavior is normal.  Nursing note and vitals reviewed.    UC Treatments / Results  Labs (all labs ordered are listed, but only abnormal results are displayed) Labs Reviewed  URINE CULTURE  POCT URINALYSIS DIP (MANUAL ENTRY)    EKG None  Radiology No results found.  Procedures Procedures (including critical care time)  Medications Ordered in UC Medications - No data to display  Initial Impression / Assessment and Plan / UC Course  I have reviewed the triage vital signs and the nursing notes.  Pertinent labs & imaging results that were available during my care of the patient were reviewed by me and considered in my medical decision making (see chart for details).     Pt is afebrile. Non-toxic appearing. Abdominal exam- not concerning for surgical abdomen at this time. UA: WNL Discussed possibility of diverticulitis, advised pt CT is the only way to confirm. Offered to send her to ED for further evaluation this evening vs starting empiric treatment, f/u with PCP. Pt would like to try empiric treatment as she was already planning on calling her PCP, Dr. Georgina Snell, on Monday 5/20.  Discussed symptoms that warrant emergent care in the ED.   Final Clinical Impressions(s) / UC Diagnoses   Final diagnoses:  LLQ abdominal pain  Blood in stool  Frequent stools     Discharge Instructions      Cipro is an antibiotic that is used to treat diverticulitis.  Please take as prescribed.  If you develop muscle or joint  pain, please stop taking this medication immediately and call our office to be prescribed a different medication.    Do not drink alcohol while taking metronidazole (Flagyl) as it may cause severe nausea and vomiting.     ED Prescriptions    Medication Sig Dispense Auth. Provider   ciprofloxacin (CIPRO) 500 MG tablet Take 1 tablet (500 mg total) by mouth 2 (two) times daily. One po bid x 7 days 14 tablet Lameisha Schuenemann O, PA-C   metroNIDAZOLE (FLAGYL) 500 MG tablet Take 1 tablet (500 mg total) by mouth 3 (three) times daily. 21 tablet Noe Gens, PA-C     Controlled Substance Prescriptions [place] Controlled Substance Registry consulted? Not Applicable   Tyrell Antonio 11/10/17 1144

## 2017-11-11 ENCOUNTER — Telehealth: Payer: Self-pay | Admitting: Family Medicine

## 2017-11-11 LAB — URINE CULTURE
MICRO NUMBER:: 90609556
SPECIMEN QUALITY:: ADEQUATE

## 2017-11-11 NOTE — Telephone Encounter (Signed)
Called patient and transferred her to scheduling to schedule appointment. Rhonda Cunningham,CMA

## 2017-11-11 NOTE — Telephone Encounter (Signed)
Please contact pt and schedule an appointment.

## 2017-11-11 NOTE — Telephone Encounter (Signed)
Pt. called this morning stating that she was seen in UC on 11/09/17. Dr. Diagnosed her with Diverticulitis. He said for to schedule with PCP. She would like someone to call her.

## 2017-11-12 ENCOUNTER — Encounter: Payer: Self-pay | Admitting: Family Medicine

## 2017-11-12 ENCOUNTER — Ambulatory Visit (INDEPENDENT_AMBULATORY_CARE_PROVIDER_SITE_OTHER): Payer: Managed Care, Other (non HMO) | Admitting: Family Medicine

## 2017-11-12 VITALS — BP 101/62 | HR 90 | Ht 69.0 in | Wt 311.0 lb

## 2017-11-12 DIAGNOSIS — K625 Hemorrhage of anus and rectum: Secondary | ICD-10-CM

## 2017-11-12 DIAGNOSIS — K5792 Diverticulitis of intestine, part unspecified, without perforation or abscess without bleeding: Secondary | ICD-10-CM

## 2017-11-12 NOTE — Patient Instructions (Addendum)
Thank you for coming in today. Continue cipro and flagyl for 1 week.  If not getting better or worsening let me know and I will order a CT scan.  If dramatically worse let us know.  Get labs today.  You should hear from Gastroenterology Dr Treasa [facility] soon.  Let me know if you do not hear anything.    Diverticulitis Diverticulitis is infection or inflammation of small pouches (diverticula) in the colon that form due to a condition called diverticulosis. Diverticula can trap stool (feces) and bacteria, causing infection and inflammation. Diverticulitis may cause severe stomach pain and diarrhea. It may lead to tissue damage in the colon that causes bleeding. The diverticula may also burst (rupture) and cause infected stool to enter other areas of the abdomen. Complications of diverticulitis can include:  Bleeding.  Severe infection.  Severe pain.  Rupture (perforation) of the colon.  Blockage (obstruction) of the colon.  What are the causes? This condition is caused by stool becoming trapped in the diverticula, which allows bacteria to grow in the diverticula. This leads to inflammation and infection. What increases the risk? You are more likely to develop this condition if:  You have diverticulosis. The risk for diverticulosis increases if: ? You are overweight or obese. ? You use tobacco products. ? You do not get enough exercise.  You eat a diet that does not include enough fiber. High-fiber foods include fruits, vegetables, beans, nuts, and whole grains.  What are the signs or symptoms? Symptoms of this condition may include:  Pain and tenderness in the abdomen. The pain is normally located on the left side of the abdomen, but it may occur in other areas.  Fever and chills.  Bloating.  Cramping.  Nausea.  Vomiting.  Changes in bowel routines.  Blood in your stool.  How is this diagnosed? This condition is diagnosed based on:  Your medical history.  A physical  exam.  Tests to make sure there is nothing else causing your condition. These tests may include: ? Blood tests. ? Urine tests. ? Imaging tests of the abdomen, including X-rays, ultrasounds, MRIs, or CT scans.  How is this treated? Most cases of this condition are mild and can be treated at home. Treatment may include:  Taking over-the-counter pain medicines.  Following a clear liquid diet.  Taking antibiotic medicines by mouth.  Rest.  More severe cases may need to be treated at a hospital. Treatment may include:  Not eating or drinking.  Taking prescription pain medicine.  Receiving antibiotic medicines through an IV tube.  Receiving fluids and nutrition through an IV tube.  Surgery.  When your condition is under control, your health care provider may recommend that you have a colonoscopy. This is an exam to look at the entire large intestine. During the exam, a lubricated, bendable tube is inserted into the anus and then passed into the rectum, colon, and other parts of the large intestine. A colonoscopy can show how severe your diverticula are and whether something else may be causing your symptoms. Follow these instructions at home: Medicines  Take over-the-counter and prescription medicines only as told by your health care provider. These include fiber supplements, probiotics, and stool softeners.  If you were prescribed an antibiotic medicine, take it as told by your health care provider. Do not stop taking the antibiotic even if you start to feel better.  Do not drive or use heavy machinery while taking prescription pain medicine. General instructions  Follow a full liquid  diet or another diet as directed by your health care provider. After your symptoms improve, your health care provider may tell you to change your diet. He or she may recommend that you eat a diet that contains at least 25 g (25 grams) of fiber daily. Fiber makes it easier to pass stool. Healthy  sources of fiber include: ? Berries. One cup contains 4-8 grams of fiber. ? Beans or lentils. One half cup contains 5-8 grams of fiber. ? Green vegetables. One cup contains 4 grams of fiber.  Exercise for at least 30 minutes, 3 times each week. You should exercise hard enough to raise your heart rate and break a sweat.  Keep all follow-up visits as told by your health care provider. This is important. You may need a colonoscopy. Contact a health care provider if:  Your pain does not improve.  You have a hard time drinking or eating food.  Your bowel movements do not return to normal. Get help right away if:  Your pain gets worse.  Your symptoms do not get better with treatment.  Your symptoms suddenly get worse.  You have a fever.  You vomit more than one time.  You have stools that are bloody, black, or tarry. Summary  Diverticulitis is infection or inflammation of small pouches (diverticula) in the colon that form due to a condition called diverticulosis. Diverticula can trap stool (feces) and bacteria, causing infection and inflammation.  You are at higher risk for this condition if you have diverticulosis and you eat a diet that does not include enough fiber.  Most cases of this condition are mild and can be treated at home. More severe cases may need to be treated at a hospital.  When your condition is under control, your health care provider may recommend that you have an exam called a colonoscopy. This exam can show how severe your diverticula are and whether something else may be causing your symptoms. This information is not intended to replace advice given to you by your health care provider. Make sure you discuss any questions you have with your health care provider. Document Released: 03/21/2005 Document Revised: 07/14/2016 Document Reviewed: 07/14/2016 Elsevier Interactive Patient Education  [employer].

## 2017-11-12 NOTE — Progress Notes (Signed)
Ariana Parks is a 37 y.o. female who presents to [place]: [facility] today for follow up for lower abdominal pain.   Patient was seen in urgent care on Saturday for lower abdominal pain. She had been having 3 days of lower abdominal pain prior to that, which she attributed to starting her period. However, the pain worsened and she developed bright red blood per rectum on Saturday. She was worked up in [place] to rule out appendicitis, ectopic, and UTI. She was tentatively diagnosed with diverticulitis, given ciprofloxacin/metronidazole, and was told to follow up with PCP. Today she reports she has not had bleeding and that her pain has improved. She still has mild pain with movement, but overall states she is feeling better. She denies any other symptoms such as chest pain, nausea, vomiting, or shortness of breath.    ROS as above:  Exam:  BP 101/62   Pulse 90   Ht 5\' 9"  (1.753 m)   Wt (!) 311 lb (141.1 kg)   BMI 45.93 kg/m  Gen: Well NAD HEENT: EOMI,  MMM Lungs: Normal work of breathing. CTABL Heart: RRR no MRG Abd: NABS, Soft. Nondistended. Mild TTP right and left lower quadrant. No rebound tenderness.  No guarding or masses palpated.  Exts: Brisk capillary refill, warm and well perfused.   Lab and Radiology Results Results for orders placed or performed during the hospital encounter of 11/09/17 (from the past 72 hour(s))  Urine culture     Status: None   Collection Time: 11/09/17  7:05 PM  Result Value Ref Range   MICRO NUMBER: [id number]    SPECIMEN QUALITY: ADEQUATE    Sample Source URINE    STATUS: FINAL    Result:      Multiple organisms present, each less than 10,000 CFU/mL. These organisms, commonly found on external and internal genitalia, are considered to be colonizers. No further testing performed.   No results found.   Assessment and Plan: 37 y.o. female  with abdominal pain.   The patient's lower abdominal pain etiology is not completely clear. It is likely she does have diverticulitis, but we would need to perform a CT scan to confirm this. However, because she is improving since Saturday on the antibiotics I believe we can hold off on the CT scan. This will keep her from having unnecessary radiation. If she does not continue improving by next week, she should report back and go get the CT scan. I have ordered basic labs today to make sure she is ready for contrast if needed and also to double check prior labs done in urgent care. We will refer her to GI to follow up. Due to rectal bleeding colonoscopy is reasonable.   Follow up in a few weeks to address chronic issues including anxiety, obesity etc.  Orders Placed This Encounter  Procedures  . CBC with Differential/Platelet  . COMPLETE METABOLIC PANEL WITH GFR  . Lipase  . Ambulatory referral to Gastroenterology    Referral Priority:   Routine    Referral Type:   Consultation    Referral Reason:   Specialty Services Required    Referred to Provider:   Annia Belt, MD    Number of Visits Requested:   1   No orders of the defined types were placed in this encounter.    Historical information moved to improve visibility of documentation.  Past Medical History:  Diagnosis Date  . Abnormal Pap  smear of cervix 2005   Mod dysplasia  . Depression   . GERD (gastroesophageal reflux disease)   . High cholesterol   . OSA (obstructive sleep apnea) 07/05/2016  . Panic attack   . PCOS (polycystic ovarian syndrome)    Past Surgical History:  Procedure Laterality Date  . KNEE SURGERY    . LEEP    . TONSILLECTOMY    . WISDOM TOOTH EXTRACTION     Social History   Tobacco Use  . Smoking status: Current Every Day Smoker    Packs/day: 0.50    Years: 15.00    Pack years: 7.50    Types: Cigarettes  . Smokeless tobacco: Never Used  . Tobacco comment: using vapes  Substance Use Topics    . Alcohol use: Yes    Alcohol/week: 0.0 oz   family history includes GER disease in her father; Heart attack in her other; Hypertension in her mother; Migraines in her father; Thyroid disease in her brother and mother.  Medications: Current Outpatient Medications  Medication Sig Dispense Refill  . Calcium Carbonate Antacid (ANTACID PO) Take by mouth.    . ciprofloxacin (CIPRO) 500 MG tablet Take 1 tablet (500 mg total) by mouth 2 (two) times daily. One po bid x 7 days 14 tablet 0  . LORazepam (ATIVAN) 0.5 MG tablet 1-2 tabs 30-60 mins prior to dental procedures 10 tablet 0  . medroxyPROGESTERone (PROVERA) 10 MG tablet 1 PILL DAILY FOR 10 DAYS EVERY MONTH FOR MENSTRUAL CYCLE. 10 tablet 3  . metFORMIN (GLUCOPHAGE) 500 MG tablet Take 1 tablet (500 mg total) by mouth 2 (two) times daily with a meal. 180 tablet 1  . methocarbamol (ROBAXIN) 500 MG tablet Take 1 tablet (500 mg total) by mouth 4 (four) times daily. 120 tablet 0  . metroNIDAZOLE (FLAGYL) 500 MG tablet Take 1 tablet (500 mg total) by mouth 3 (three) times daily. 21 tablet 0  . naproxen (NAPROSYN) 375 MG tablet Take 1 tablet (375 mg total) by mouth 2 (two) times daily. 20 tablet 0  . ranitidine (ZANTAC) 150 MG capsule Take 150 mg by mouth.    . Vilazodone HCl (VIIBRYD) 10 MG TABS Take 1 tablet (10 mg total) by mouth daily. 30 tablet 2   No current facility-administered medications for this visit.    Allergies  Allergen Reactions  . Celexa [Citalopram Hydrobromide]   . Ibuprofen     High doses  . Prednisone   . Prilosec [Omeprazole]   . Wellbutrin [Bupropion]   . Flexeril [Cyclobenzaprine] Other (See Comments)    Dreams    Health Maintenance Health Maintenance  Topic Date Due  . HIV Screening  08/20/1996  . TETANUS/TDAP  08/20/2000  . PAP SMEAR  08/09/2007  . INFLUENZA VACCINE  01/23/2018    Discussed warning signs or symptoms. Please see discharge instructions. Patient expresses understanding.

## 2017-11-13 LAB — CBC WITH DIFFERENTIAL/PLATELET
Basophils Absolute: 141 {cells}/uL (ref 0–200)
Basophils Relative: 0.8 %
Eosinophils Absolute: 282 {cells}/uL (ref 15–500)
Eosinophils Relative: 1.6 %
HCT: 46.5 % — ABNORMAL HIGH (ref 35.0–45.0)
Hemoglobin: 15.6 g/dL — ABNORMAL HIGH (ref 11.7–15.5)
Lymphs Abs: 5403 {cells}/uL — ABNORMAL HIGH (ref 850–3900)
MCH: 30.1 pg (ref 27.0–33.0)
MCHC: 33.5 g/dL (ref 32.0–36.0)
MCV: 89.8 fL (ref 80.0–100.0)
MPV: 11 fL (ref 7.5–12.5)
Monocytes Relative: 9.4 %
Neutro Abs: 10120 {cells}/uL — ABNORMAL HIGH (ref 1500–7800)
Neutrophils Relative %: 57.5 %
Platelets: 334 Thousand/uL (ref 140–400)
RBC: 5.18 Million/uL — ABNORMAL HIGH (ref 3.80–5.10)
RDW: 12.1 % (ref 11.0–15.0)
Total Lymphocyte: 30.7 %
WBC mixed population: 1654 {cells}/uL — ABNORMAL HIGH (ref 200–950)
WBC: 17.6 Thousand/uL — ABNORMAL HIGH (ref 3.8–10.8)

## 2017-11-13 LAB — COMPLETE METABOLIC PANEL WITH GFR
AG RATIO: 1.6 (calc) (ref 1.0–2.5)
ALT: 16 U/L (ref 6–29)
AST: 17 U/L (ref 10–30)
Albumin: 4 g/dL (ref 3.6–5.1)
Alkaline phosphatase (APISO): 63 U/L (ref 33–115)
BILIRUBIN TOTAL: 0.2 mg/dL (ref 0.2–1.2)
BUN: 10 mg/dL (ref 7–25)
CHLORIDE: 108 mmol/L (ref 98–110)
CO2: 23 mmol/L (ref 20–32)
Calcium: 9.4 mg/dL (ref 8.6–10.2)
Creat: 0.69 mg/dL (ref 0.50–1.10)
GFR, Est African American: 130 mL/min/{1.73_m2} (ref >=60)
GFR, Est Non African American: 112 mL/min/{1.73_m2} (ref >=60)
Globulin: 2.5 g/dL (calc) (ref 1.9–3.7)
Glucose, Bld: 97 mg/dL (ref 65–99)
POTASSIUM: 4.3 mmol/L (ref 3.5–5.3)
Sodium: 139 mmol/L (ref 135–146)
Total Protein: 6.5 g/dL (ref 6.1–8.1)

## 2017-11-13 LAB — LIPASE: Lipase: 49 U/L (ref 7–60)

## 2017-11-13 NOTE — Addendum Note (Signed)
Addended by: Gregor Hams on: 11/13/2017 07:10 AM   Modules accepted: Orders

## 2017-12-02 ENCOUNTER — Encounter: Payer: Self-pay | Admitting: Family Medicine

## 2017-12-02 ENCOUNTER — Ambulatory Visit (INDEPENDENT_AMBULATORY_CARE_PROVIDER_SITE_OTHER): Payer: Managed Care, Other (non HMO) | Admitting: Family Medicine

## 2017-12-02 VITALS — BP 130/64 | HR 82 | Wt 316.0 lb

## 2017-12-02 DIAGNOSIS — Z8719 Personal history of other diseases of the digestive system: Secondary | ICD-10-CM | POA: Diagnosis not present

## 2017-12-02 DIAGNOSIS — F331 Major depressive disorder, recurrent, moderate: Secondary | ICD-10-CM | POA: Diagnosis not present

## 2017-12-02 DIAGNOSIS — Z23 Encounter for immunization: Secondary | ICD-10-CM | POA: Diagnosis not present

## 2017-12-02 DIAGNOSIS — F419 Anxiety disorder, unspecified: Secondary | ICD-10-CM

## 2017-12-02 NOTE — Patient Instructions (Signed)
Thank you for coming in today. Please arrange for a pap smear.  Recheck with me as needed.

## 2017-12-02 NOTE — Progress Notes (Signed)
Note duplication error 

## 2017-12-02 NOTE — Progress Notes (Signed)
Ariana Parks is a 37 y.o. female who presents to [place]: [place] today for follow up.  Patient was seen on the 21st of May for potential diverticulitis. She was being treated with metronidazole and ciprofloxacin. She had never received a CT scan and we agreed to hold off again because she was improving. Today she states she feels completely back to normal. She is only concerned about a high white blood cell count that her labs showed at the last visit.   She states her mood has been down lately because she lost her job a week ago. She is not taking Viibryd anymore.    ROS as above:  Exam:  BP 130/64   Pulse 82   Wt (!) 316 lb (143.3 kg)   BMI 46.67 kg/m  Gen: Well NAD HEENT: EOMI,  MMM Lungs: Normal work of breathing. CTABL Heart: RRR no MRG Abd: NABS, Soft. Nondistended, Nontender Exts: Brisk capillary refill, warm and well perfused.  Psych: Alert and oriented. Normal speech and through process. No SI/HI.  Lab and Radiology Results No results found for this or any previous visit (from the past 72 hour(s)). No results found.   Assessment and Plan: 37 y.o. female with   Diverticulitis: She is asymptomatic at this time, so there is no need for the CT scan that we have previously postponed. If she develops abdominal pain again, we will be quicker to treat the pain with metronidazole and ciprofloxacin. Furthermore, I do not believe we need to repeat labs at this time because she is feeling better.   Depression/Anxiety: We discussed again her mood. She recently lost her job and would likely benefit from restarting [place], but she is reluctant to at this time. If she continues to feel depressed or anxious or they worsen, she can contact me and we will restart that medication.   Tdap given prior to D/C.   Orders Placed This Encounter  Procedures  . Tdap  vaccine greater than or equal to 7yo IM   No orders of the defined types were placed in this encounter.    Historical information moved to improve visibility of documentation.  Past Medical History:  Diagnosis Date  . Abnormal Pap smear of cervix 2005   Mod dysplasia  . Depression   . GERD (gastroesophageal reflux disease)   . High cholesterol   . OSA (obstructive sleep apnea) 07/05/2016  . Panic attack   . PCOS (polycystic ovarian syndrome)    Past Surgical History:  Procedure Laterality Date  . KNEE SURGERY    . LEEP    . TONSILLECTOMY    . WISDOM TOOTH EXTRACTION     Social History   Tobacco Use  . Smoking status: Current Every Day Smoker    Packs/day: 0.50    Years: 15.00    Pack years: 7.50    Types: Cigarettes  . Smokeless tobacco: Never Used  . Tobacco comment: using vapes  Substance Use Topics  . Alcohol use: Yes    Alcohol/week: 0.0 oz   family history includes GER disease in her father; Heart attack in her other; Hypertension in her mother; Migraines in her father; Thyroid disease in her brother and mother.  Medications: Current Outpatient Medications  Medication Sig Dispense Refill  . Calcium Carbonate Antacid (ANTACID PO) Take by mouth.    Marland Kitchen LORazepam (ATIVAN) 0.5 MG tablet 1-2 tabs 30-60 mins prior to dental procedures 10 tablet 0  .  medroxyPROGESTERone (PROVERA) 10 MG tablet 1 PILL DAILY FOR 10 DAYS EVERY MONTH FOR MENSTRUAL CYCLE. 10 tablet 3  . metFORMIN (GLUCOPHAGE) 500 MG tablet Take 1 tablet (500 mg total) by mouth 2 (two) times daily with a meal. 180 tablet 1  . methocarbamol (ROBAXIN) 500 MG tablet Take 1 tablet (500 mg total) by mouth 4 (four) times daily. 120 tablet 0  . naproxen (NAPROSYN) 375 MG tablet Take 1 tablet (375 mg total) by mouth 2 (two) times daily. 20 tablet 0  . ranitidine (ZANTAC) 150 MG capsule Take 150 mg by mouth.     No current facility-administered medications for this visit.    Allergies  Allergen Reactions  . Celexa  [Citalopram Hydrobromide]   . Ibuprofen     High doses  . Prednisone   . Prilosec [Omeprazole]   . Wellbutrin [Bupropion]   . Flexeril [Cyclobenzaprine] Other (See Comments)    Dreams    Health Maintenance Health Maintenance  Topic Date Due  . HIV Screening  08/20/1996  . PAP SMEAR  08/09/2007  . INFLUENZA VACCINE  01/23/2018  . TETANUS/TDAP  12/03/2027    Discussed warning signs or symptoms. Please see discharge instructions. Patient expresses understanding.   I personally was present and performed or re-performed the history, physical exam and medical decision-making activities of this service and have verified that the service and findings are accurately documented in the student's note. ___________________________________________ Lynne Leader M.D., ABFM., CAQSM. Primary Care and Sports Medicine Adjunct Instructor of [employer]

## 2018-02-06 LAB — HM COLONOSCOPY

## 2018-03-04 ENCOUNTER — Telehealth: Payer: Self-pay | Admitting: Family Medicine

## 2018-03-04 NOTE — Telephone Encounter (Signed)
Received colonoscopy report from [facility] [place] [facility] in [place]. Plan for 5-year recheck. Results will be abstracted.

## 2018-03-13 ENCOUNTER — Encounter: Payer: Self-pay | Admitting: Family Medicine

## 2018-04-04 ENCOUNTER — Ambulatory Visit (INDEPENDENT_AMBULATORY_CARE_PROVIDER_SITE_OTHER): Payer: Managed Care, Other (non HMO) | Admitting: Family Medicine

## 2018-04-04 ENCOUNTER — Encounter: Payer: Self-pay | Admitting: Family Medicine

## 2018-04-04 VITALS — BP 130/67 | HR 60 | Ht 69.0 in | Wt 316.0 lb

## 2018-04-04 DIAGNOSIS — F419 Anxiety disorder, unspecified: Secondary | ICD-10-CM | POA: Diagnosis not present

## 2018-04-04 DIAGNOSIS — Z720 Tobacco use: Secondary | ICD-10-CM | POA: Diagnosis not present

## 2018-04-04 DIAGNOSIS — F331 Major depressive disorder, recurrent, moderate: Secondary | ICD-10-CM | POA: Diagnosis not present

## 2018-04-04 DIAGNOSIS — Z23 Encounter for immunization: Secondary | ICD-10-CM | POA: Diagnosis not present

## 2018-04-04 DIAGNOSIS — K55059 Acute (reversible) ischemia of intestine, part and extent unspecified: Secondary | ICD-10-CM | POA: Diagnosis not present

## 2018-04-04 HISTORY — DX: Acute (reversible) ischemia of intestine, part and extent unspecified: K55.059

## 2018-04-04 MED ORDER — LORAZEPAM 1 MG PO TABS: ORAL_TABLET | ORAL | 0 refills | Status: DC

## 2018-04-04 NOTE — Progress Notes (Signed)
Ariana Parks is a 37 y.o. female who presents to [place] today for anxiety.  [name] has a history of anxiety and depression.  She previously was taking Viibryd but had difficulty to remember to take it regularly.  She notes that overall she is been doing pretty well from a depression and anxiety standpoint.  She received regular counseling and works on her cognitive behavioral therapy which seems to work pretty well.  She notes that she has situational anxiety that she often will use Ativan for.  She will use Ativan for dental procedures which does help.  She notes that she typically requires greater than 1 mg of Ativan to control her anxiety and would like a change in her prescription.  She has several dental procedures coming up in the near future.  She notes her depression is reasonably controlled and she denies any significant active SI or HI.  Additionally she notes that in the interval she had an episode of abdominal pain and bleeding.  She had a work-up with gastroenterology with a colonoscopy.  She was diagnosed with nonocclusive ischemia and was advised to discontinue smoking.  She has not had any episodes since and feels well.  No abdominal pain or vomiting or diarrhea currently.   ROS as above:  Exam:  BP 130/67   Pulse 60   Ht 5\' 9"  (1.753 m)   Wt (!) 316 lb (143.3 kg)   BMI 46.67 kg/m  Wt Readings from Last 5 Encounters:  04/04/18 (!) 316 lb (143.3 kg)  12/02/17 (!) 316 lb (143.3 kg)  11/12/17 (!) 311 lb (141.1 kg)  11/09/17 (!) 309 lb 12 oz (140.5 kg)  07/15/17 296 lb (134.3 kg)    Gen: Well NAD HEENT: EOMI,  MMM Lungs: Normal work of breathing. CTABL Heart: RRR no MRG Abd: NABS, Soft. Nondistended, Nontender Exts: Brisk capillary refill, warm and well perfused.  Psych alert and oriented normal speech thought process and  affect.  Depression screen [facility] 2/9 04/04/2018 11/12/2017 02/13/2017 10/30/2016 08/02/2016  Decreased Interest 1 1 2 2 1   Down, Depressed, Hopeless 1 1 2 1 1   PHQ - 2 Score 2 2 4 3 2   Altered sleeping 2 1 2 3 2   Tired, decreased energy 2 2 2 2 2   Change in appetite 1 1 1 2 1   Feeling bad or failure about yourself  1 1 2 2 2   Trouble concentrating 1 1 1 1 1   Moving slowly or fidgety/restless 2 1 1 1 1   Suicidal thoughts 1 1 1 1 1   PHQ-9 Score 12 10 14 15 12   Difficult doing work/chores Somewhat difficult Somewhat difficult - - -   GAD 7 : Generalized Anxiety Score 04/04/2018 11/12/2017 08/02/2016 07/05/2016  Nervous, Anxious, on Edge 2 2 2 2   Control/stop worrying 1 1 2 3   Worry too much - different things 1 1 2 3   Trouble relaxing 1 1 1 3   Restless 2 1 1 2   Easily annoyed or irritable 1 1 1 2   Afraid - awful might happen 1 1 2 3   Total GAD 7 Score 9 8 11 18   Anxiety Difficulty Somewhat difficult Somewhat difficult - -      Lab and Radiology Results No results found for this or any previous visit (from the past 72 hour(s)). No results found.    Assessment and Plan: 37 y.o. female with situational anxiety: Plan to use Ativan.  We will continue to use this medication sparingly.  However she notes that she is using it more frequently will likely restart Viibryd for better baseline anxiety control.  Depression: Moderate.  Off of Viibryd but receiving counseling.  Watchful waiting.  Nonocclusive mesenteric ischemia: Watchful waiting recommend smoking cessation.  Flu vaccine given today prior to discharge. Orders Placed This Encounter  Procedures  . Flu Vaccine QUAD 6+ mos PF IM (Fluarix Quad PF)   Meds ordered this encounter  Medications  . LORazepam (ATIVAN) 1 MG tablet    Sig: 1-2 tabs 30-60 mins prior to dental procedures or flights    Dispense:  12 tablet    Refill:  0     Historical information moved to improve visibility of documentation.  Past Medical History:   Diagnosis Date  . Abnormal Pap smear of cervix 2005   Mod dysplasia  . Depression   . GERD (gastroesophageal reflux disease)   . High cholesterol   . OSA (obstructive sleep apnea) 07/05/2016  . Panic attack   . PCOS (polycystic ovarian syndrome)    Past Surgical History:  Procedure Laterality Date  . KNEE SURGERY    . LEEP    . TONSILLECTOMY    . WISDOM TOOTH EXTRACTION     Social History   Tobacco Use  . Smoking status: Current Every Day Smoker    Packs/day: 0.50    Years: 15.00    Pack years: 7.50    Types: Cigarettes  . Smokeless tobacco: Never Used  . Tobacco comment: using vapes  Substance Use Topics  . Alcohol use: Yes    Alcohol/week: 0.0 standard drinks   family history includes GER disease in her father; Heart attack in her other; Hypertension in her mother; Migraines in her father; Thyroid disease in her brother and mother.  Medications: Current Outpatient Medications  Medication Sig Dispense Refill  . Calcium Carbonate Antacid (ANTACID PO) Take by mouth.    Marland Kitchen LORazepam (ATIVAN) 1 MG tablet 1-2 tabs 30-60 mins prior to dental procedures or flights 12 tablet 0  . medroxyPROGESTERone (PROVERA) 10 MG tablet 1 PILL DAILY FOR 10 DAYS EVERY MONTH FOR MENSTRUAL CYCLE. 10 tablet 3  . methocarbamol (ROBAXIN) 500 MG tablet Take 1 tablet (500 mg total) by mouth 4 (four) times daily. 120 tablet 0  . naproxen (NAPROSYN) 375 MG tablet Take 1 tablet (375 mg total) by mouth 2 (two) times daily. 20 tablet 0  . ranitidine (ZANTAC) 150 MG capsule Take 150 mg by mouth.     No current facility-administered medications for this visit.    Allergies  Allergen Reactions  . Celexa [Citalopram Hydrobromide]   . Ibuprofen     High doses  . Prednisone   . Prilosec [Omeprazole]   . Wellbutrin [Bupropion]   . Flexeril [Cyclobenzaprine] Other (See Comments)    Dreams     Discussed warning signs or symptoms. Please see discharge instructions. Patient expresses understanding.

## 2018-04-04 NOTE — Patient Instructions (Signed)
Thank you for coming in today. Use ativan sparingly for dental procedures or flights.  Let me know if the anxiety level worsens.   Follow up with Dr Gertie Fey for Pap smear.   Recheck with me in 6  Months or sooner if needed.

## 2018-08-11 ENCOUNTER — Encounter: Payer: Self-pay | Admitting: Family Medicine

## 2018-08-11 ENCOUNTER — Ambulatory Visit (INDEPENDENT_AMBULATORY_CARE_PROVIDER_SITE_OTHER): Payer: Managed Care, Other (non HMO) | Admitting: Family Medicine

## 2018-08-11 VITALS — BP 123/65 | HR 68 | Temp 97.8°F | Ht 69.0 in | Wt 308.0 lb

## 2018-08-11 DIAGNOSIS — F419 Anxiety disorder, unspecified: Secondary | ICD-10-CM

## 2018-08-11 DIAGNOSIS — F331 Major depressive disorder, recurrent, moderate: Secondary | ICD-10-CM

## 2018-08-11 DIAGNOSIS — Z20828 Contact with and (suspected) exposure to other viral communicable diseases: Secondary | ICD-10-CM

## 2018-08-11 DIAGNOSIS — Z0189 Encounter for other specified special examinations: Secondary | ICD-10-CM | POA: Diagnosis not present

## 2018-08-11 DIAGNOSIS — L03012 Cellulitis of left finger: Secondary | ICD-10-CM | POA: Diagnosis not present

## 2018-08-11 LAB — POCT INFLUENZA A/B
INFLUENZA A, POC: NEGATIVE
Influenza B, POC: NEGATIVE

## 2018-08-11 MED ORDER — FAMOTIDINE 20 MG PO TABS: 20.0000 mg | ORAL_TABLET | Freq: Two times a day (BID) | ORAL | 3 refills | Status: DC | PRN

## 2018-08-11 MED ORDER — NAPROXEN 375 MG PO TABS: 375.0000 mg | ORAL_TABLET | Freq: Two times a day (BID) | ORAL | 3 refills | Status: DC | PRN

## 2018-08-11 MED ORDER — DOXYCYCLINE HYCLATE 100 MG PO TABS: 100.0000 mg | ORAL_TABLET | Freq: Two times a day (BID) | ORAL | 0 refills | Status: DC

## 2018-08-11 MED ORDER — LORAZEPAM 1 MG PO TABS: ORAL_TABLET | ORAL | 0 refills | Status: DC

## 2018-08-11 MED ORDER — OSELTAMIVIR PHOSPHATE 75 MG PO CAPS: 75.0000 mg | ORAL_CAPSULE | Freq: Two times a day (BID) | ORAL | 0 refills | Status: DC

## 2018-08-11 NOTE — Progress Notes (Signed)
Ariana Parks is a 38 y.o. female who presents to [place]: [facility] today for flu exposure and wound on finger.  Patient suffered a small laceration to the palmar aspect of her right fourth digit at PIP.  She notes this became slightly red and drained a small amount of pus.  Is still mildly tender.  This is been ongoing for a few days.  No fevers or chills.  Additionally patient was exposed to a friend who subsequently tested positive for influenza B.  She has failed members at home however vulnerable to the flu and is interested in prophylaxis or treatment if needed.  Distally patient notes that she needs refills of some of her chronic medications.  She would like refill of Ativan.  She takes it very infrequently for severe anxiety or anxiety provoking events such as traveling or dental procedures.  Additionally she has some acid reflux which previously was managed with intermittent ranitidine.  She like to switch to famotidine due to the recall of ranitidine.    ROS as above:  Exam:  BP 123/65   Pulse 68   Temp 97.8 F (36.6 C) (Oral)   Ht 5\' 9"  (1.753 m)   Wt (!) 308 lb (139.7 kg)   BMI 45.48 kg/m  Wt Readings from Last 5 Encounters:  08/11/18 (!) 308 lb (139.7 kg)  04/04/18 (!) 316 lb (143.3 kg)  12/02/17 (!) 316 lb (143.3 kg)  11/12/17 (!) 311 lb (141.1 kg)  11/09/17 (!) 309 lb 12 oz (140.5 kg)    Gen: Well NAD HEENT: EOMI,  MMM Lungs: Normal work of breathing. CTABL Heart: RRR no MRG Abd: NABS, Soft. Nondistended, Nontender Exts: Brisk capillary refill, warm and well perfused.  Right hand: Small wound at the ulnar side of the fourth palmar PIP.  Slight surrounding erythema.  Not particular tender normal finger motion. Psych alert and oriented normal speech thought process and affect.    GAD 7 : Generalized Anxiety Score 08/11/2018 04/04/2018 11/12/2017  08/02/2016  Nervous, Anxious, on Edge 2 2 2 2   Control/stop worrying 1 1 1 2   Worry too much - different things 1 1 1 2   Trouble relaxing 1 1 1 1   Restless 1 2 1 1   Easily annoyed or irritable 1 1 1 1   Afraid - awful might happen 1 1 1 2   Total GAD 7 Score 8 9 8 11   Anxiety Difficulty Somewhat difficult Somewhat difficult Somewhat difficult -      Lab and Radiology Results Results for orders placed or performed in visit on 08/11/18 (from the past 72 hour(s))  POCT Influenza A/B     Status: None   Collection Time: 08/11/18  3:02 PM  Result Value Ref Range   Influenza A, POC Negative Negative   Influenza B, POC Negative Negative   No results found.    Assessment and Plan: 38 y.o. female with  Right hand wound with likely cellulitis.  Treat with doxycycline.  Recheck if not improving.  Flu exposure: Influenza negative today.  Empiric treatment with prophylactic dosing of Tamiflu advised patient to take it 1 pill daily for 10 days.  Anxiety: Reasonably controlled.  Continue low-dose intermittent Ativan.  Acid reflux: Switch to famotidine  Patient overdue for health maintenance items including cervical cancer screening.  Recommend schedule Pap smear near future.  As well as physical in the near future.     PDMP reviewed during this encounter. Orders  Placed This Encounter  Procedures  . POCT Influenza A/B   Meds ordered this encounter  Medications  . doxycycline (VIBRA-TABS) 100 MG tablet    Sig: Take 1 tablet (100 mg total) by mouth 2 (two) times daily.    Dispense:  14 tablet    Refill:  0  . LORazepam (ATIVAN) 1 MG tablet    Sig: 1-2 tabs 30-60 mins prior to dental procedures or flights    Dispense:  12 tablet    Refill:  0  . naproxen (NAPROSYN) 375 MG tablet    Sig: Take 1 tablet (375 mg total) by mouth 2 (two) times daily as needed.    Dispense:  60 tablet    Refill:  3  . famotidine (PEPCID) 20 MG tablet    Sig: Take 1 tablet (20 mg total) by mouth 2 (two)  times daily as needed for heartburn or indigestion.    Dispense:  180 tablet    Refill:  3  . oseltamivir (TAMIFLU) 75 MG capsule    Sig: Take 1 capsule (75 mg total) by mouth 2 (two) times daily.    Dispense:  10 capsule    Refill:  0     Historical information moved to improve visibility of documentation.  Past Medical History:  Diagnosis Date  . Abnormal Pap smear of cervix 2005   Mod dysplasia  . Depression   . GERD (gastroesophageal reflux disease)   . High cholesterol   . Nonocclusive mesenteric ischemia ([place]) 04/04/2018   Dx colonscopy Aug 2019 [facility]  . OSA (obstructive sleep apnea) 07/05/2016  . Panic attack   . PCOS (polycystic ovarian syndrome)    Past Surgical History:  Procedure Laterality Date  . KNEE SURGERY    . LEEP    . TONSILLECTOMY    . WISDOM TOOTH EXTRACTION     Social History   Tobacco Use  . Smoking status: Current Every Day Smoker    Packs/day: 0.50    Years: 15.00    Pack years: 7.50    Types: Cigarettes  . Smokeless tobacco: Never Used  . Tobacco comment: using vapes  Substance Use Topics  . Alcohol use: Yes    Alcohol/week: 0.0 standard drinks   family history includes GER disease in her father; Heart attack in an other family member; Hypertension in her mother; Migraines in her father; Thyroid disease in her brother and mother.  Medications: Current Outpatient Medications  Medication Sig Dispense Refill  . Calcium Carbonate Antacid (ANTACID PO) Take by mouth.    Marland Kitchen LORazepam (ATIVAN) 1 MG tablet 1-2 tabs 30-60 mins prior to dental procedures or flights 12 tablet 0  . medroxyPROGESTERone (PROVERA) 10 MG tablet 1 PILL DAILY FOR 10 DAYS EVERY MONTH FOR MENSTRUAL CYCLE. 10 tablet 3  . methocarbamol (ROBAXIN) 500 MG tablet Take 1 tablet (500 mg total) by mouth 4 (four) times daily. 120 tablet 0  . naproxen (NAPROSYN) 375 MG tablet Take 1 tablet (375 mg total) by mouth 2 (two) times daily as needed. 60 tablet 3  . doxycycline (VIBRA-TABS) 100  MG tablet Take 1 tablet (100 mg total) by mouth 2 (two) times daily. 14 tablet 0  . famotidine (PEPCID) 20 MG tablet Take 1 tablet (20 mg total) by mouth 2 (two) times daily as needed for heartburn or indigestion. 180 tablet 3  . oseltamivir (TAMIFLU) 75 MG capsule Take 1 capsule (75 mg total) by mouth 2 (two) times daily. 10 capsule 0   No current  facility-administered medications for this visit.    Allergies  Allergen Reactions  . Celexa [Citalopram Hydrobromide]   . Ibuprofen     High doses  . Prednisone   . Prilosec [Omeprazole]   . Wellbutrin [Bupropion]   . Flexeril [Cyclobenzaprine] Other (See Comments)    Dreams     Discussed warning signs or symptoms. Please see discharge instructions. Patient expresses understanding.

## 2018-08-11 NOTE — Patient Instructions (Addendum)
Thank you for coming in today. Take the doxycycline twice daily for 1 week for finger infection.    For flu exposure take tamiflu twice daily for flu treatment or daily for prevention.   For doxycycline take with a little bit of food. Do not take with calcium or iron.   Cellulitis, Adult  Cellulitis is a skin infection. The infected area is usually warm, red, swollen, and tender. This condition occurs most often in the arms and lower legs. The infection can travel to the muscles, blood, and underlying tissue and become serious. It is very important to get treated for this condition. What are the causes? Cellulitis is caused by bacteria. The bacteria enter through a break in the skin, such as a cut, burn, insect bite, open sore, or crack. What increases the risk? This condition is more likely to occur in people who:  Have a weak body defense system (immune system).  Have open wounds on the skin, such as cuts, burns, bites, and scrapes. Bacteria can enter the body through these open wounds.  Are older than 37 years of age.  Have diabetes.  Have a type of long-lasting (chronic) liver disease (cirrhosis) or kidney disease.  Are obese.  Have a skin condition such as: ? Itchy rash (eczema). ? Slow movement of blood in the veins (venous stasis). ? Fluid buildup below the skin (edema).  Have had radiation therapy.  Use IV drugs. What are the signs or symptoms? Symptoms of this condition include:  Redness, streaking, or spotting on the skin.  Swollen area of the skin.  Tenderness or pain when an area of the skin is touched.  Warm skin.  A fever.  Chills.  Blisters. How is this diagnosed? This condition is diagnosed based on a medical history and physical exam. You may also have tests, including:  Blood tests.  Imaging tests. How is this treated? Treatment for this condition may include:  Medicines, such as antibiotic medicines or medicines to treat allergies  (antihistamines).  Supportive care, such as rest and application of cold or warm cloths (compresses) to the skin.  Hospital care, if the condition is severe. The infection usually starts to get better within 1-2 days of treatment. Follow these instructions at home:  Medicines  Take over-the-counter and prescription medicines only as told by your health care provider.  If you were prescribed an antibiotic medicine, take it as told by your health care provider. Do not stop taking the antibiotic even if you start to feel better. General instructions  Drink enough fluid to keep your urine pale yellow.  Do not touch or rub the infected area.  Raise (elevate) the infected area above the level of your heart while you are sitting or lying down.  Apply warm or cold compresses to the affected area as told by your health care provider.  Keep all follow-up visits as told by your health care provider. This is important. These visits let your health care provider make sure a more serious infection is not developing. Contact a health care provider if:  You have a fever.  Your symptoms do not begin to improve within 1-2 days of starting treatment.  Your bone or joint underneath the infected area becomes painful after the skin has healed.  Your infection returns in the same area or another area.  You notice a swollen bump in the infected area.  You develop new symptoms.  You have a general ill feeling (malaise) with muscle aches and pains.  Get help right away if:  Your symptoms get worse.  You feel very sleepy.  You develop vomiting or diarrhea that persists.  You notice red streaks coming from the infected area.  Your red area gets larger or turns dark in color. These symptoms may represent a serious problem that is an emergency. Do not wait to see if the symptoms will go away. Get medical help right away. Call your local emergency services (911 in the [place] Do not drive yourself  to the hospital. Summary  Cellulitis is a skin infection. This condition occurs most often in the arms and lower legs.  Treatment for this condition may include medicines, such as antibiotic medicines or antihistamines.  Take over-the-counter and prescription medicines only as told by your health care provider. If you were prescribed an antibiotic medicine, do not stop taking the antibiotic even if you start to feel better.  Contact a health care provider if your symptoms do not begin to improve within 1-2 days of starting treatment or your symptoms get worse.  Keep all follow-up visits as told by your health care provider. This is important. These visits let your health care provider make sure that a more serious infection is not developing. This information is not intended to replace advice given to you by your health care provider. Make sure you discuss any questions you have with your health care provider. Document Released: 03/21/2005 Document Revised: 10/31/2017 Document Reviewed: 10/31/2017 Elsevier Interactive Patient Education  2019 [employer].

## 2018-09-09 ENCOUNTER — Encounter: Payer: Self-pay | Admitting: Family Medicine

## 2018-09-22 ENCOUNTER — Telehealth: Payer: Self-pay | Admitting: Family Medicine

## 2018-09-22 ENCOUNTER — Encounter: Payer: Self-pay | Admitting: Family Medicine

## 2018-09-22 ENCOUNTER — Other Ambulatory Visit: Payer: Self-pay | Admitting: Family Medicine

## 2018-09-22 NOTE — Telephone Encounter (Signed)
Pt called and needed refill of Ativan and Vibra- Tabs will forward to provider to review.

## 2018-09-23 ENCOUNTER — Ambulatory Visit (INDEPENDENT_AMBULATORY_CARE_PROVIDER_SITE_OTHER): Payer: Managed Care, Other (non HMO) | Admitting: Family Medicine

## 2018-09-23 ENCOUNTER — Other Ambulatory Visit: Payer: Self-pay

## 2018-09-23 ENCOUNTER — Encounter: Payer: Self-pay | Admitting: Family Medicine

## 2018-09-23 VITALS — Temp 98.3°F | Ht 69.5 in | Wt 299.0 lb

## 2018-09-23 DIAGNOSIS — Z1389 Encounter for screening for other disorder: Secondary | ICD-10-CM

## 2018-09-23 DIAGNOSIS — F332 Major depressive disorder, recurrent severe without psychotic features: Secondary | ICD-10-CM

## 2018-09-23 DIAGNOSIS — F419 Anxiety disorder, unspecified: Secondary | ICD-10-CM

## 2018-09-23 MED ORDER — VILAZODONE HCL 20 MG PO TABS: ORAL_TABLET | ORAL | 0 refills | Status: DC

## 2018-09-23 MED ORDER — VILAZODONE HCL 10 MG PO TABS: ORAL_TABLET | ORAL | 0 refills | Status: DC

## 2018-09-23 MED ORDER — LORAZEPAM 1 MG PO TABS: 1.0000 mg | ORAL_TABLET | Freq: Three times a day (TID) | ORAL | 0 refills | Status: DC | PRN

## 2018-09-23 NOTE — Progress Notes (Addendum)
Virtual Visit  via Video Note  I connected with      Ariana Parks  by a video enabled telemedicine application and verified that I am speaking with the correct person using two identifiers.   I discussed the limitations of evaluation and management by telemedicine and the availability of in person appointments. The patient expressed understanding and agreed to proceed.  History of Present Illness: Ariana Parks is a 38 y.o. female who would like to discuss mood.  Ariana Parks has a history of significant anxiety and depression.  She notes her symptoms have been worse recently.  She has been out of her Viibryd for some time now.  She was doing okay previously but notes that her mood worsened.  She previously tolerated Viibryd 20 mg daily.  Additionally she is run out of her Ativan as well which she takes very infrequently for severe anxiety.  She notes her symptoms are more severe now.  She has adopted some healthy strategies including getting daily outside exercise outside while practicing social distancing and safe techniques.  Additionally it is been about 6 months and she is seeing her therapist Ariana Parks.  She has yet to call her.  She denies any suicidal or homicidal ideation.     Observations/Objective: Temp 98.3 F (36.8 C) (Oral)   Ht 5' 9.5" (1.765 m)   Wt 299 lb (135.6 kg)   BMI 43.52 kg/m  Wt Readings from Last 5 Encounters:  09/23/18 299 lb (135.6 kg)  08/11/18 (!) 308 lb (139.7 kg)  04/04/18 (!) 316 lb (143.3 kg)  12/02/17 (!) 316 lb (143.3 kg)  11/12/17 (!) 311 lb (141.1 kg)   Exam: Appearance nontoxic-appearing Normal Speech.  No shortness of breath hoarse voice quality to or tachypnea. PsychKalman Parks oriented.  Tearful affect.  Normal speech and thought process.  No SI or HI expressed.   Depression screen [facility] 2/9 09/23/2018 08/11/2018 04/04/2018 11/12/2017 02/13/2017  Decreased Interest 3 1 1 1 2   Down, Depressed, Hopeless 3 1 1 1 2   PHQ - 2 Score 6 2 2 2 4    Altered sleeping 3 2 2 1 2   Tired, decreased energy 2 2 2 2 2   Change in appetite 3 1 1 1 1   Feeling bad or failure about yourself  3 1 1 1 2   Trouble concentrating 3 1 1 1 1   Moving slowly or fidgety/restless 3 1 2 1 1   Suicidal thoughts 2 1 1 1 1   PHQ-9 Score 25 11 12 10 14   Difficult doing work/chores Extremely dIfficult Somewhat difficult Somewhat difficult Somewhat difficult -  Some recent data might be hidden   GAD 7 : Generalized Anxiety Score 09/23/2018 08/11/2018 04/04/2018 11/12/2017  Nervous, Anxious, on Edge 3 2 2 2   Control/stop worrying 3 1 1 1   Worry too much - different things 3 1 1 1   Trouble relaxing 3 1 1 1   Restless 3 1 2 1   Easily annoyed or irritable 3 1 1 1   Afraid - awful might happen 3 1 1 1   Total GAD 7 Score 21 8 9 8   Anxiety Difficulty Extremely difficult Somewhat difficult Somewhat difficult Somewhat difficult      Assessment and Plan: 38 y.o. female with  Anxiety: Much more severe recently.  Patient has lost some of her coping mechanisms and is very concerned about the COVID-19 pandemic.  Plan to restart Viibryd at 10 mg and taper up to 20 mg in 1 week.  Additionally restart/refill limited Ativan.  Additionally recommend patient contact her therapist again to reestablish care to get continued care therapy or counseling.  We will recheck in 3 weeks via WebEx virtual visit scheduled today.  Additionally patient is expressing severe depressive symptoms.  The plan is the same.  Suicidal precautions reviewed as well.  PDMP reviewed during this encounter. No orders of the defined types were placed in this encounter.  Meds ordered this encounter  Medications  . LORazepam (ATIVAN) 1 MG tablet    Sig: Take 1 tablet (1 mg total) by mouth every 8 (eight) hours as needed for anxiety.    Dispense:  60 tablet    Refill:  0  . Vilazodone HCl (VIIBRYD) 10 MG TABS    Sig: Take 1 tablet (10 mg total) by mouth daily for 7 days, THEN 2 tablets (20 mg total) daily for  21 days.    Dispense:  49 tablet    Refill:  0    Tried and failed effexor, and zoloft and paxil. Have taken prior and done well.    Follow Up Instructions:    I discussed the assessment and treatment plan with the patient. The patient was provided an opportunity to ask questions and all were answered. The patient agreed with the plan and demonstrated an understanding of the instructions.   The patient was advised to call back or seek an in-person evaluation if the symptoms worsen or if the condition fails to improve as anticipated.  I provided 25 minutes of non-face-to-face time during this encounter.    Historical information moved to improve visibility of documentation.  Past Medical History:  Diagnosis Date  . Abnormal Pap smear of cervix 2005   Mod dysplasia  . Depression   . GERD (gastroesophageal reflux disease)   . High cholesterol   . Nonocclusive mesenteric ischemia ([place]) 04/04/2018   Dx colonscopy Aug 2019 [facility]  . OSA (obstructive sleep apnea) 07/05/2016  . Panic attack   . PCOS (polycystic ovarian syndrome)    Past Surgical History:  Procedure Laterality Date  . KNEE SURGERY    . LEEP    . TONSILLECTOMY    . WISDOM TOOTH EXTRACTION     Social History   Tobacco Use  . Smoking status: Current Every Day Smoker    Packs/day: 0.50    Years: 15.00    Pack years: 7.50    Types: Cigarettes  . Smokeless tobacco: Never Used  . Tobacco comment: using vapes  Substance Use Topics  . Alcohol use: Yes    Alcohol/week: 0.0 standard drinks   family history includes GER disease in her father; Heart attack in an other family member; Hypertension in her mother; Migraines in her father; Thyroid disease in her brother and mother.  Medications: Current Outpatient Medications  Medication Sig Dispense Refill  . Calcium Carbonate Antacid (ANTACID PO) Take by mouth.    . famotidine (PEPCID) 20 MG tablet Take 1 tablet (20 mg total) by mouth 2 (two) times daily as needed for  heartburn or indigestion. 180 tablet 3  . LORazepam (ATIVAN) 1 MG tablet Take 1 tablet (1 mg total) by mouth every 8 (eight) hours as needed for anxiety. 60 tablet 0  . medroxyPROGESTERone (PROVERA) 10 MG tablet 1 PILL DAILY FOR 10 DAYS EVERY MONTH FOR MENSTRUAL CYCLE. 10 tablet 3  . methocarbamol (ROBAXIN) 500 MG tablet Take 1 tablet (500 mg total) by mouth 4 (four) times daily. 120 tablet 0  . naproxen (NAPROSYN) 375 MG tablet Take 1 tablet (  375 mg total) by mouth 2 (two) times daily as needed. 60 tablet 3  . Vilazodone HCl (VIIBRYD) 10 MG TABS Take 1 tablet (10 mg total) by mouth daily for 7 days, THEN 2 tablets (20 mg total) daily for 21 days. 49 tablet 0   No current facility-administered medications for this visit.    Allergies  Allergen Reactions  . Celexa [Citalopram Hydrobromide]   . Ibuprofen     High doses  . Prednisone   . Prilosec [Omeprazole]   . Wellbutrin [Bupropion]   . Flexeril [Cyclobenzaprine] Other (See Comments)    Dreams

## 2018-09-23 NOTE — Telephone Encounter (Signed)
Webex has been made. No further questions at this time.

## 2018-09-23 NOTE — Telephone Encounter (Signed)
This medication or product is on your plan's list of covered drugs. Prior authorization is not required at this time. If your pharmacy has questions regarding the processing of your prescription, please have them call the [facility] pharmacy help desk at ([phone]. **Please note: Formulary lowering, tiering exception, cost reduction and/or pre-benefit determination review (including prospective Medicare hospice reviews) requests cannot be requested using this method of submission.   I called the pharmacy to let them know that Prior Authoirxation was not needed for the medciaotn. Per the pharmacy can you send in the 20 mg tablets and have it say take half of the pill for 7 days and then increase to once daily. Please advise.

## 2018-09-23 NOTE — Patient Instructions (Addendum)
Thank you for coming in today.  Restart Viibryd at 10mg  daily. Increase to 20mg  in 1 week.  Use Ativan sparingly.  Recheck as scheduled on April 20th at 11:10 via WebEx.   Call [name] and restart therapy.  Let me know if you have problems as I have a backup plan.  \ Keep me updated via Mychart    Stay safe and see you soon.

## 2018-09-23 NOTE — Telephone Encounter (Signed)
Please ask patient to schedule a WebEx ED visit.

## 2018-10-13 ENCOUNTER — Encounter: Payer: Self-pay | Admitting: Family Medicine

## 2018-10-13 ENCOUNTER — Ambulatory Visit (INDEPENDENT_AMBULATORY_CARE_PROVIDER_SITE_OTHER): Payer: Managed Care, Other (non HMO) | Admitting: Family Medicine

## 2018-10-13 VITALS — Ht 69.25 in | Wt 298.0 lb

## 2018-10-13 DIAGNOSIS — F419 Anxiety disorder, unspecified: Secondary | ICD-10-CM | POA: Diagnosis not present

## 2018-10-13 DIAGNOSIS — Z1389 Encounter for screening for other disorder: Secondary | ICD-10-CM | POA: Diagnosis not present

## 2018-10-13 DIAGNOSIS — H9190 Unspecified hearing loss, unspecified ear: Secondary | ICD-10-CM | POA: Diagnosis not present

## 2018-10-13 DIAGNOSIS — F332 Major depressive disorder, recurrent severe without psychotic features: Secondary | ICD-10-CM

## 2018-10-13 MED ORDER — VILAZODONE HCL 40 MG PO TABS: 40.0000 mg | ORAL_TABLET | Freq: Every day | ORAL | 0 refills | Status: DC

## 2018-10-13 NOTE — Patient Instructions (Addendum)
Thank you for coming in today. Increase the viibryd to 40 mg after a few days.   Continue therapy.   Recheck with me in 3 weeks via video visit.   Let me know sooner if things are not going well.    Suicidal Feelings: How to Help Yourself Suicide is when you end your own life. There are many things you can do to help yourself feel better when struggling with these feelings. Many services and people are available to support you and others who struggle with similar feelings. If you ever feel like you may hurt yourself or others, or have thoughts about taking your own life, get help right away. To get help:  Call your local emergency services (911 in the [place]  Go to your nearest emergency department.  Call a suicide hotline to speak with a trained counselor. The following suicide hotlines are available in the [place] States: ? 1-800-273-TALK (813)234-5337). ? 1-800-SUICIDE (530)043-0753). ? (629)533-5107. This is a hotline for Spanish speakers. ? 585-457-4688. This is a hotline for TTY users. ? 1-866-4-U-TREVOR 813-367-3512). This is a hotline for lesbian, gay, bisexual, transgender, or questioning youth. ? For a list of hotlines in [place], visit ParkingAffiliatePrograms.se.html  Contact a crisis center or a local suicide prevention center. To find a crisis center or suicide prevention center: ? Call your local hospital, clinic, community service organization, mental health center, social service provider, or health department. Ask for help with connecting to a crisis center. ? For a list of crisis centers in the [place], visit: suicidepreventionlifeline.org ? For a list of crisis centers in [place], visit: suicideprevention.ca How to help yourself feel better   Promise yourself that you will not do anything extreme when you have suicidal feelings. Remember, there is hope. Many people have gotten through suicidal thoughts and feelings,  and you can too. If you have had these feelings before, remind yourself that you can get through them again.  Let family, friends, teachers, or counselors know how you are feeling. Try not to separate yourself from those who care about you and want to help you. Talk with someone every day, even if you do not feel sociable. Face-to-face conversation is best to help them understand your feelings.  Contact a mental health care provider and work with this person regularly.  Make a safety plan that you can follow during a crisis. Include phone numbers of suicide prevention hotlines, mental health professionals, and trusted friends and family members you can call during an emergency. Save these numbers on your phone.  If you are thinking of taking a lot of medicine, give your medicine to someone who can give it to you as prescribed. If you are on antidepressants and are concerned you will overdose, tell your health care provider so that he or she can give you safer medicines.  Try to stick to your routines. Follow a schedule every day. Make self-care a priority.  Make a list of realistic goals, and cross them off when you achieve them. Accomplishments can give you a sense of worth.  Wait until you are feeling better before doing things that you find difficult or unpleasant.  Do things that you have always enjoyed to take your mind off your feelings. Try reading a book, or listening to or playing music. Spending time outside, in nature, may help you feel better. Follow these instructions at home:   Visit your primary health care provider every year for a checkup.  Work with a Librarian, academic  care provider as needed.  Eat a well-balanced diet, and eat regular meals.  Get plenty of rest.  Exercise if you are able. Just 30 minutes of exercise each day can help you feel better.  Take over-the-counter and prescription medicines only as told by your health care provider. Ask your mental health care  provider about the possible side effects of any medicines you are taking.  Do not use alcohol or drugs, and remove these substances from your home.  Remove weapons, poisons, knives, and other deadly items from your home. General recommendations  Keep your living space well lit.  When you are feeling well, write yourself a letter with tips and support that you can read when you are not feeling well.  Remember that life's difficulties can be sorted out with help. Conditions can be treated, and you can learn behaviors and ways of thinking that will help you. Where to find more information  National Suicide Prevention Lifeline: www.suicidepreventionlifeline.org  Hopeline: www.hopeline.[place] for Suicide Prevention: PromotionalLoans.co.za  The [employer] (for lesbian, gay, bisexual, transgender, or questioning youth): www.thetrevorproject.org Contact a health care provider if:  You feel as though you are a burden to others.  You feel agitated, angry, vengeful, or have extreme mood swings.  You have withdrawn from family and friends. Get help right away if:  You are talking about suicide or wishing to die.  You start making plans for how to commit suicide.  You feel that you have no reason to live.  You start making plans for putting your affairs in order, saying goodbye, or giving your possessions away.  You feel guilt, shame, or unbearable pain, and it seems like there is no way out.  You are frequently using drugs or alcohol.  You are engaging in risky behaviors that could lead to death. If you have any of these symptoms, get help right away. Call emergency services, go to your nearest emergency department or crisis center, or call a suicide crisis helpline. Summary  Suicide is when you take your own life.  Promise yourself that you will not do anything extreme when you have suicidal feelings.  Let family, friends, teachers, or counselors know how you are  feeling.  Get help right away if you feel as though life is getting too tough to handle and you are thinking about suicide. This information is not intended to replace advice given to you by your health care provider. Make sure you discuss any questions you have with your health care provider. Document Released: 12/16/2002 Document Revised: 01/22/2017 Document Reviewed: 01/22/2017 Elsevier Interactive Patient Education  2019 [employer].

## 2018-10-13 NOTE — Progress Notes (Addendum)
Virtual Visit  via Video Note  I connected with      Ariana Parks  by a video enabled telemedicine application and verified that I am speaking with the correct person using two identifiers.   I discussed the limitations of evaluation and management by telemedicine and the availability of in person appointments. The patient expressed understanding and agreed to proceed.  History of Present Illness: Ariana Parks is a 38 y.o. female who would like to follow-up anxiety and depression.  Patient has a longstanding history of anxiety and depression.  She had a follow-up visit with me on March 31 where her symptoms had worsened likely in response to COVID-19 and she had run out of her Viibryd and Ambien.  She had plan to restart Viibryd at 10 and then increase up to 20 mg in 1 week as well as restarting limited Ativan.  Additionally I recommended that she restart contact her therapist to restart counseling/therapy.  In the interim she Parks that she continues to be quite symptomatic.  She has started counseling which she finds to be very helpful.  She Parks that continues to be bothered by significant anxiety depressive and irritable symptoms.  She had a big verbal fight with her mother which is not atypical for her.  She Parks that she is feeling a bit better now but still quite bothered.  She currently is taking 20 mg of Viibryd but Parks that it has not helped much yet.  She denies any active SI or HI feelings.     Additionally Ariana Parks that her hearing has decreased over the last several years.  She would like a hearing test when COVID-19 is released.  She Parks her biggest problem is hearing low pitched noises.  She has trouble with telephones and televisions at times.   Observations/Objective: Ht 5' 9.25" (1.759 m)   Wt 298 lb (135.2 kg)   BMI 43.69 kg/m  Wt Readings from Last 5 Encounters:  10/13/18 298 lb (135.2 kg)  09/23/18 299 lb (135.6 kg)  08/11/18 (!) 308 lb (139.7  kg)  04/04/18 (!) 316 lb (143.3 kg)  12/02/17 (!) 316 lb (143.3 kg)   Exam: Appearance non-toxic Normal Speech.  Psych: Alert and oriented. Tearful affect. Normal speech and thought process.   Depression screen [facility] 2/9 10/13/2018 09/23/2018 08/11/2018 04/04/2018 11/12/2017  Decreased Interest 3 3 1 1 1   Down, Depressed, Hopeless 3 3 1 1 1   PHQ - 2 Score 6 6 2 2 2   Altered sleeping 3 3 2 2 1   Tired, decreased energy 3 2 2 2 2   Change in appetite 2 3 1 1 1   Feeling bad or failure about yourself  3 3 1 1 1   Trouble concentrating 3 3 1 1 1   Moving slowly or fidgety/restless 3 3 1 2 1   Suicidal thoughts 2 2 1 1 1   PHQ-9 Score 25 25 11 12 10   Difficult doing work/chores Very difficult Extremely dIfficult Somewhat difficult Somewhat difficult Somewhat difficult  Some recent data might be hidden    GAD 7 : Generalized Anxiety Score 10/13/2018 09/23/2018 08/11/2018 04/04/2018  Nervous, Anxious, on Edge 3 3 2 2   Control/stop worrying 3 3 1 1   Worry too much - different things 3 3 1 1   Trouble relaxing 3 3 1 1   Restless 3 3 1 2   Easily annoyed or irritable 3 3 1 1   Afraid - awful might happen 2 3 1 1   Total GAD 7  Score 20 21 8 9   Anxiety Difficulty Very difficult Extremely difficult Somewhat difficult Somewhat difficult     Lab and Radiology Results No results found for this or any previous visit (from the past 75 hour(s)). No results found.   Assessment and Plan: 38 y.o. female with anxiety and depression.  Still quite bothersome.  Fortunately she has restarted counseling which has been helpful in the past.  Plan to increase Viibryd to 40 mg and continue Ativan and counseling.  Recheck in 3 weeks.  Suicide precautions reviewed.  Change in hearing: Reasonable to assess with audiology referral once COVID-19 is less problematic.  PDMP reviewed during this encounter. No orders of the defined types were placed in this encounter.  Meds ordered this encounter  Medications  . Vilazodone  HCl (VIIBRYD) 40 MG TABS    Sig: Take 1 tablet (40 mg total) by mouth daily.    Dispense:  90 tablet    Refill:  0    Follow Up Instructions:    I discussed the assessment and treatment plan with the patient. The patient was provided an opportunity to ask questions and all were answered. The patient agreed with the plan and demonstrated an understanding of the instructions.   The patient was advised to call back or seek an in-person evaluation if the symptoms worsen or if the condition fails to improve as anticipated.  Time: 15 minutes of intraservice time, with >22 minutes of total time during today's visit.      Historical information moved to improve visibility of documentation.  Past Medical History:  Diagnosis Date  . Abnormal Pap smear of cervix 2005   Mod dysplasia  . Depression   . GERD (gastroesophageal reflux disease)   . High cholesterol   . Nonocclusive mesenteric ischemia ([place]) 04/04/2018   Dx colonscopy Aug 2019 [facility]  . OSA (obstructive sleep apnea) 07/05/2016  . Panic attack   . PCOS (polycystic ovarian syndrome)    Past Surgical History:  Procedure Laterality Date  . KNEE SURGERY    . LEEP    . TONSILLECTOMY    . WISDOM TOOTH EXTRACTION     Social History   Tobacco Use  . Smoking status: Current Every Day Smoker    Packs/day: 0.50    Years: 15.00    Pack years: 7.50    Types: Cigarettes  . Smokeless tobacco: Never Used  . Tobacco comment: using vapes  Substance Use Topics  . Alcohol use: Yes    Alcohol/week: 0.0 standard drinks   family history includes GER disease in her father; Heart attack in an other family member; Hypertension in her mother; Migraines in her father; Thyroid disease in her brother and mother.  Medications: Current Outpatient Medications  Medication Sig Dispense Refill  . Calcium Carbonate Antacid (ANTACID PO) Take by mouth.    . famotidine (PEPCID) 20 MG tablet Take 1 tablet (20 mg total) by mouth 2 (two) times daily as  needed for heartburn or indigestion. 180 tablet 3  . LORazepam (ATIVAN) 1 MG tablet Take 1 tablet (1 mg total) by mouth every 8 (eight) hours as needed for anxiety. 60 tablet 0  . medroxyPROGESTERone (PROVERA) 10 MG tablet 1 PILL DAILY FOR 10 DAYS EVERY MONTH FOR MENSTRUAL CYCLE. 10 tablet 3  . methocarbamol (ROBAXIN) 500 MG tablet Take 1 tablet (500 mg total) by mouth 4 (four) times daily. 120 tablet 0  . naproxen (NAPROSYN) 375 MG tablet Take 1 tablet (375 mg total) by mouth 2 (  two) times daily as needed. 60 tablet 3  . Vilazodone HCl (VIIBRYD) 40 MG TABS Take 1 tablet (40 mg total) by mouth daily. 90 tablet 0   No current facility-administered medications for this visit.    Allergies  Allergen Reactions  . Celexa [Citalopram Hydrobromide]   . Ibuprofen     High doses  . Prednisone   . Prilosec [Omeprazole]   . Wellbutrin [Bupropion]   . Flexeril [Cyclobenzaprine] Other (See Comments)    Dreams   Addendum to correct date error due to templating issue

## 2018-10-17 ENCOUNTER — Other Ambulatory Visit: Payer: Self-pay | Admitting: Family Medicine

## 2018-10-17 MED ORDER — VILAZODONE HCL 40 MG PO TABS: 40.0000 mg | ORAL_TABLET | Freq: Every day | ORAL | 0 refills | Status: DC

## 2018-10-17 NOTE — Telephone Encounter (Signed)
Viibryd dose has been increased to 40 mg.  New prescription of 40 mg dose sent to [facility].

## 2018-10-17 NOTE — Telephone Encounter (Signed)
Will forward to provider for review.

## 2018-11-06 ENCOUNTER — Encounter: Payer: Self-pay | Admitting: Family Medicine

## 2018-11-06 ENCOUNTER — Ambulatory Visit (INDEPENDENT_AMBULATORY_CARE_PROVIDER_SITE_OTHER): Payer: Managed Care, Other (non HMO) | Admitting: Family Medicine

## 2018-11-06 VITALS — BP 111/72 | HR 76 | Temp 97.2°F | Wt 296.0 lb

## 2018-11-06 DIAGNOSIS — F419 Anxiety disorder, unspecified: Secondary | ICD-10-CM | POA: Diagnosis not present

## 2018-11-06 DIAGNOSIS — M25551 Pain in right hip: Secondary | ICD-10-CM | POA: Diagnosis not present

## 2018-11-06 DIAGNOSIS — F332 Major depressive disorder, recurrent severe without psychotic features: Secondary | ICD-10-CM | POA: Diagnosis not present

## 2018-11-06 MED ORDER — NAPROXEN 375 MG PO TABS: 375.0000 mg | ORAL_TABLET | Freq: Two times a day (BID) | ORAL | 3 refills | Status: DC | PRN

## 2018-11-06 NOTE — Progress Notes (Signed)
Virtual Visit  via Video Note  I connected with      Ariana Parks by a video enabled telemedicine application and verified that I am speaking with the correct person using two identifiers.   I discussed the limitations of evaluation and management by telemedicine and the availability of in person appointments. The patient expressed understanding and agreed to proceed.  History of Present Illness: Ariana Parks is a 37 y.o. female who would like to discuss anxiety and depression.  Patient has longstanding anxiety and depression.  She was seen on March 31 after her symptoms worsened.  He was seen again on April 20.  At that time she had restarted therapy which she found to be helpful.  Additionally she had increased her Viibryd dose back up to 20 mg.  In the interim we plan to increase Viibryd to 40 mg and continued Ativan for anxiety and counseling.  In the interim she notes that she missed several days on taking the viibryd.  She struggles to remember to take the medication.  She notes that if she takes it on empty stomach it causes nausea and if she takes it at bedtime but sometimes interferes with sleep.  She tries to move her to take it after she eats breakfast but frequently forgets.  Additionally patient notes right hip pain for the last week.  She notes pain is confined to the lateral hip and quite bothersome.  She denies any recent injury.     Observations/Objective: BP 111/72   Pulse 76   Temp (!) 97.2 F (36.2 C) (Oral)   Wt 296 lb (134.3 kg)   BMI 43.40 kg/m  Wt Readings from Last 5 Encounters:  11/06/18 296 lb (134.3 kg)  10/13/18 298 lb (135.2 kg)  09/23/18 299 lb (135.6 kg)  08/11/18 (!) 308 lb (139.7 kg)  04/04/18 (!) 316 lb (143.3 kg)   Exam: Appearance Normal Speech.   Psych: Alert and oriented.  Speech thought process and affect are normal.  No active SI or HI.  Depression screen [facility] 2/9 11/06/2018 10/13/2018 09/23/2018 08/11/2018 04/04/2018  Decreased  Interest 2 3 3 1 1   Down, Depressed, Hopeless 2 3 3 1 1   PHQ - 2 Score 4 6 6 2 2   Altered sleeping 2 3 3 2 2   Tired, decreased energy 2 3 2 2 2   Change in appetite 3 2 3 1 1   Feeling bad or failure about yourself  3 3 3 1 1   Trouble concentrating 2 3 3 1 1   Moving slowly or fidgety/restless 2 3 3 1 2   Suicidal thoughts 2 2 2 1 1   PHQ-9 Score 20 25 25 11 12   Difficult doing work/chores Somewhat difficult Very difficult Extremely dIfficult Somewhat difficult Somewhat difficult  Some recent data might be hidden   GAD 7 : Generalized Anxiety Score 11/06/2018 10/13/2018 09/23/2018 08/11/2018  Nervous, Anxious, on Edge 2 3 3 2   Control/stop worrying 3 3 3 1   Worry too much - different things 3 3 3 1   Trouble relaxing 3 3 3 1   Restless 3 3 3 1   Easily annoyed or irritable 2 3 3 1   Afraid - awful might happen 2 2 3 1   Total GAD 7 Score 18 20 21 8   Anxiety Difficulty Somewhat difficult Very difficult Extremely difficult Somewhat difficult     Lab and Radiology Results No results found for this or any previous visit (from the past 72 hour(s)). No results found.  Assessment and Plan: 38 y.o. female with  Psych: About the same.  Patient is receiving counseling services which are helpful however she is very inconsistent with her medications.  We spent some time troubleshooting ways to remember to take her medication more consistently.  I do not see a way to help her if she cannot take her medicines.  We discussed possibly switching the medication to a longer acting medicine such as Prozac but she notes that she is tried that in the past and has not been helpful.  Additionally patient has new hip pain.  This should be evaluated in clinic.  Patient will schedule early next week for hip pain.  We will also spent some time discussing medication as well at that visit.  PDMP not reviewed this encounter. No orders of the defined types were placed in this encounter.  No orders of the defined types  were placed in this encounter.   Follow Up Instructions:    I discussed the assessment and treatment plan with the patient. The patient was provided an opportunity to ask questions and all were answered. The patient agreed with the plan and demonstrated an understanding of the instructions.   The patient was advised to call back or seek an in-person evaluation if the symptoms worsen or if the condition fails to improve as anticipated.  Time: 15 minutes of intraservice time, with >22 minutes of total time during today's visit.      Historical information moved to improve visibility of documentation.  Past Medical History:  Diagnosis Date  . Abnormal Pap smear of cervix 2005   Mod dysplasia  . Depression   . GERD (gastroesophageal reflux disease)   . High cholesterol   . Nonocclusive mesenteric ischemia ([place]) 04/04/2018   Dx colonscopy Aug 2019 [facility]  . OSA (obstructive sleep apnea) 07/05/2016  . Panic attack   . PCOS (polycystic ovarian syndrome)    Past Surgical History:  Procedure Laterality Date  . KNEE SURGERY    . LEEP    . TONSILLECTOMY    . WISDOM TOOTH EXTRACTION     Social History   Tobacco Use  . Smoking status: Current Every Day Smoker    Packs/day: 0.50    Years: 15.00    Pack years: 7.50    Types: Cigarettes  . Smokeless tobacco: Never Used  . Tobacco comment: using vapes  Substance Use Topics  . Alcohol use: Yes    Alcohol/week: 0.0 standard drinks   family history includes GER disease in her father; Heart attack in an other family member; Hypertension in her mother; Migraines in her father; Thyroid disease in her brother and mother.  Medications: Current Outpatient Medications  Medication Sig Dispense Refill  . Calcium Carbonate Antacid (ANTACID PO) Take by mouth.    . famotidine (PEPCID) 20 MG tablet Take 1 tablet (20 mg total) by mouth 2 (two) times daily as needed for heartburn or indigestion. 180 tablet 3  . LORazepam (ATIVAN) 1 MG tablet Take 1  tablet (1 mg total) by mouth every 8 (eight) hours as needed for anxiety. 60 tablet 0  . medroxyPROGESTERone (PROVERA) 10 MG tablet 1 PILL DAILY FOR 10 DAYS EVERY MONTH FOR MENSTRUAL CYCLE. 10 tablet 3  . methocarbamol (ROBAXIN) 500 MG tablet Take 1 tablet (500 mg total) by mouth 4 (four) times daily. 120 tablet 0  . naproxen (NAPROSYN) 375 MG tablet Take 1 tablet (375 mg total) by mouth 2 (two) times daily as needed. 60 tablet 3  .  Vilazodone HCl (VIIBRYD) 40 MG TABS Take 1 tablet (40 mg total) by mouth daily. 90 tablet 0   No current facility-administered medications for this visit.    Allergies  Allergen Reactions  . Celexa [Citalopram Hydrobromide]   . Ibuprofen     High doses  . Prednisone   . Prilosec [Omeprazole]   . Wellbutrin [Bupropion]   . Flexeril [Cyclobenzaprine] Other (See Comments)    Dreams

## 2018-11-11 ENCOUNTER — Encounter: Payer: Self-pay | Admitting: Family Medicine

## 2018-11-11 ENCOUNTER — Other Ambulatory Visit: Payer: Self-pay

## 2018-11-11 ENCOUNTER — Ambulatory Visit (INDEPENDENT_AMBULATORY_CARE_PROVIDER_SITE_OTHER): Payer: Self-pay | Admitting: Family Medicine

## 2018-11-11 VITALS — BP 137/83 | HR 64 | Temp 97.9°F | Ht 69.0 in | Wt 301.0 lb

## 2018-11-11 DIAGNOSIS — M7061 Trochanteric bursitis, right hip: Secondary | ICD-10-CM | POA: Diagnosis not present

## 2018-11-11 DIAGNOSIS — F332 Major depressive disorder, recurrent severe without psychotic features: Secondary | ICD-10-CM | POA: Diagnosis not present

## 2018-11-11 DIAGNOSIS — F419 Anxiety disorder, unspecified: Secondary | ICD-10-CM | POA: Diagnosis not present

## 2018-11-11 DIAGNOSIS — M722 Plantar fascial fibromatosis: Secondary | ICD-10-CM

## 2018-11-11 MED ORDER — METHOCARBAMOL 500 MG PO TABS: 500.0000 mg | ORAL_TABLET | Freq: Four times a day (QID) | ORAL | 1 refills | Status: DC

## 2018-11-11 NOTE — Patient Instructions (Signed)
Thank you for coming in today.  I think the hip pain is due to trochanteric bursitis.  Do the side leg raises. 30 reps 2x daily. When you can do that more easily you can add the ones where your leg goes up and back a bit.   Do the cross over stretch and the figure 4 stretch and the standing IT band stretch when needed especially after the exercise.   For the foot I think you have planter fasciitis.  Do the standing heel raises.  Remember to go down slowly.  Do about 30 reps 2x daily.   Do the ice massage or ice bath in the evenings.   Use heel cushion.   Recheck in about 1 month if not better.    Trochanteric Bursitis Rehab Ask your health care provider which exercises are safe for you. Do exercises exactly as told by your health care provider and adjust them as directed. It is normal to feel mild stretching, pulling, tightness, or discomfort as you do these exercises, but you should stop right away if you feel sudden pain or your pain gets worse.Do not begin these exercises until told by your health care provider. Stretching exercises These exercises warm up your muscles and joints and improve the movement and flexibility of your hip. These exercises also help to relieve pain and stiffness. Exercise A: Iliotibial band stretch  1. Lie on your side with your left / right leg in the top position. 2. Bend your left / right knee and grab your ankle. 3. Slowly bring your knee back so your thigh is behind your body. 4. Slowly lower your knee toward the floor until you feel a gentle stretch on the outside of your left / right thigh. If you do not feel a stretch and your knee will not fall farther, place the heel of your other foot on top of your outer knee and pull your thigh down farther. 5. Hold this position for __________ seconds. 6. Slowly return to the starting position. Repeat __________ times. Complete this exercise __________ times a day. Strengthening exercises These exercises  build strength and endurance in your hip and pelvis. Endurance is the ability to use your muscles for a long time, even after they get tired. Exercise B: Bridge (hip extensors)  1. Lie on your back on a firm surface with your knees bent and your feet flat on the floor. 2. Tighten your buttocks muscles and lift your buttocks off the floor until your trunk is level with your thighs. You should feel the muscles working in your buttocks and the back of your thighs. If this exercise is too easy, try doing it with your arms crossed over your chest. 3. Hold this position for __________ seconds. 4. Slowly return to the starting position. 5. Let your muscles relax completely between repetitions. Repeat __________ times. Complete this exercise __________ times a day. Exercise C: Squats (knee extensors and  quadriceps) 1. Stand in front of a table, with your feet and knees pointing straight ahead. You may rest your hands on the table for balance but not for support. 2. Slowly bend your knees and lower your hips like you are going to sit in a chair. ? Keep your weight over your heels, not over your toes. ? Keep your lower legs upright so they are parallel with the table legs. ? Do not let your hips go lower than your knees. ? Do not bend lower than told by your health care provider. ?  If your hip pain increases, do not bend as low. 3. Hold this position for __________ seconds. 4. Slowly push with your legs to return to standing. Do not use your hands to pull yourself to standing. Repeat __________ times. Complete this exercise __________ times a day. Exercise D: Hip hike 1. Stand sideways on a bottom step. Stand on your left / right leg with your other foot unsupported next to the step. You can hold onto the railing or wall if needed for balance. 2. Keeping your knees straight and your torso square, lift your left / right hip up toward the ceiling. 3. Hold this position for __________ seconds. 4. Slowly  let your left / right hip lower toward the floor, past the starting position. Your foot should get closer to the floor. Do not lean or bend your knees. Repeat __________ times. Complete this exercise __________ times a day. Exercise E: Single leg stand 1. Stand near a counter or door frame that you can hold onto for balance as needed. It is helpful to stand in front of a mirror for this exercise so you can watch your hip. 2. Squeeze your left / right buttock muscles then lift up your other foot. Do not let your left / right hip push out to the side. 3. Hold this position for __________ seconds. Repeat __________ times. Complete this exercise __________ times a day. This information is not intended to replace advice given to you by your health care provider. Make sure you discuss any questions you have with your health care provider. Document Released: 07/19/2004 Document Revised: 02/16/2016 Document Reviewed: 05/27/2015 Elsevier Interactive Patient Education  2019 [employer].    Plantar Fasciitis Rehab Ask your health care provider which exercises are safe for you. Do exercises exactly as told by your health care provider and adjust them as directed. It is normal to feel mild stretching, pulling, tightness, or discomfort as you do these exercises, but you should stop right away if you feel sudden pain or your pain gets worse. Do not begin these exercises until told by your health care provider. Stretching and range of motion exercises These exercises warm up your muscles and joints and improve the movement and flexibility of your foot. These exercises also help to relieve pain. Exercise A: Plantar fascia stretch  1. Sit with your left / right leg crossed over your opposite knee. 2. Hold your heel with one hand with that thumb near your arch. With your other hand, hold your toes and gently pull them back toward the top of your foot. You should feel a stretch on the bottom of your toes or your  foot or both. 3. Hold this stretch for__________ seconds. 4. Slowly release your toes and return to the starting position. Repeat __________ times. Complete this exercise __________ times a day. Exercise B: Gastroc, standing  1. Stand with your hands against a wall. 2. Extend your left / right leg behind you, and bend your front knee slightly. 3. Keeping your heels on the floor and keeping your back knee straight, shift your weight toward the wall without arching your back. You should feel a gentle stretch in your left / right calf. 4. Hold this position for __________ seconds. Repeat __________ times. Complete this exercise __________ times a day. Exercise C: Soleus, standing 1. Stand with your hands against a wall. 2. Extend your left / right leg behind you, and bend your front knee slightly. 3. Keeping your heels on the floor, bend  your back knee and slightly shift your weight over the back leg. You should feel a gentle stretch deep in your calf. 4. Hold this position for __________ seconds. Repeat __________ times. Complete this exercise __________ times a day. Exercise D: Gastrocsoleus, standing 1. Stand with the ball of your left / right foot on a step. The ball of your foot is on the walking surface, right under your toes. 2. Keep your other foot firmly on the same step. 3. Hold onto the wall or a railing for balance. 4. Slowly lift your other foot, allowing your body weight to press your heel down over the edge of the step. You should feel a stretch in your left / right calf. 5. Hold this position for __________ seconds. 6. Return both feet to the step. 7. Repeat this exercise with a slight bend in your left / right knee. Repeat __________ times with your left / right knee straight and __________ times with your left / right knee bent. Complete this exercise __________ times a day. Balance exercise This exercise builds your balance and strength control of your arch to help take  pressure off your plantar fascia. Exercise E: Single leg stand 1. Without shoes, stand near a railing or in a doorway. You may hold onto the railing or door frame as needed. 2. Stand on your left / right foot. Keep your big toe down on the floor and try to keep your arch lifted. Do not let your foot roll inward. 3. Hold this position for __________ seconds. 4. If this exercise is too easy, you can try it with your eyes closed or while standing on a pillow. Repeat __________ times. Complete this exercise __________ times a day. This information is not intended to replace advice given to you by your health care provider. Make sure you discuss any questions you have with your health care provider. Document Released: 06/11/2005 Document Revised: 02/14/2016 Document Reviewed: 04/25/2015 Elsevier Interactive Patient Education  2019 [employer].

## 2018-11-11 NOTE — Progress Notes (Signed)
Ariana Parks is a 38 y.o. female who presents to [facility] today for discuss right lateral hip pain and right heel pain.  Right lateral hip pain.  Present off and on for a few years but worsening over the last month or 2.  She notes pain in the lateral hip worse with activity and laying on her right side.  She denies any pain radiating down her leg.  She denies weakness or numbness.  She is tried some over-the-counter medication which is helped a little.  Additionally she notes pain in the right plantar calcaneus.  This is been ongoing for a few months as well.  She denies any injury also.  She thinks it may be plantar fasciitis.  She is tried getting some heel cushion pads which help a little.  She is not had any other treatment yet.  Additionally she was seen previously for mood.  She is now actually taking the Viibryd.  She was able to get a pillbox and set a reminder on her phone and is now taking it more consistently.  She notes improvement.   ROS as above:  Exam:  BP 137/83   Pulse 64   Temp 97.9 F (36.6 C) (Oral)   Ht 5\' 9"  (1.753 m)   Wt (!) 301 lb (136.5 kg)   SpO2 99%   BMI 44.45 kg/m  Wt Readings from Last 5 Encounters:  11/11/18 (!) 301 lb (136.5 kg)  11/06/18 296 lb (134.3 kg)  10/13/18 298 lb (135.2 kg)  09/23/18 299 lb (135.6 kg)  08/11/18 (!) 308 lb (139.7 kg)    Gen: Well NAD HEENT: EOMI,  MMM Lungs: Normal work of breathing. CTABL Heart: RRR no MRG Abd: NABS, Soft. Nondistended, Nontender Exts: Brisk capillary refill, warm and well perfused.  Psych: Alert and oriented normal speech thought process and affect. MSK: Right hip: Normal-appearing Normal motion. Tender palpation greater trochanter. Hip abduction strength diminished 4/5 and painful.  Hip external rotational strength 4/5 and painful.  Normal internal rotation strength.  Left hip normal-appearing nontender normal motion normal strength.  Right foot: Normal-appearing normal motion.  Tender palpation plantar calcaneus.   Depression screen [facility] 2/9 11/11/2018 11/06/2018 10/13/2018 09/23/2018 08/11/2018  Decreased Interest 2 2 3 3 1   Down, Depressed, Hopeless 3 2 3 3 1   PHQ - 2 Score 5 4 6 6 2   Altered sleeping 2 2 3 3 2   Tired, decreased energy 2 2 3 2 2   Change in appetite 2 3 2 3 1   Feeling bad or failure about yourself  2 3 3 3 1   Trouble concentrating 1 2 3 3 1   Moving slowly or fidgety/restless 2 2 3 3 1   Suicidal thoughts 2 2 2 2 1   PHQ-9 Score 18 20 25 25 11   Difficult doing work/chores Somewhat difficult Somewhat difficult Very difficult Extremely dIfficult Somewhat difficult  Some recent data might be hidden    GAD 7 : Generalized Anxiety Score 11/11/2018 11/06/2018 10/13/2018 09/23/2018  Nervous, Anxious, on Edge 2 2 3 3   Control/stop worrying 1 3 3 3   Worry too much - different things 2 3 3 3   Trouble relaxing 1 3 3 3   Restless 2 3 3 3   Easily annoyed or irritable 2 2 3 3   Afraid - awful might happen 1 2 2 3   Total GAD 7 Score 11 18 20 21   Anxiety Difficulty Somewhat difficult Somewhat difficult Very difficult  Extremely difficult     Lab and Radiology Results No results found for this or any previous visit (from the past 41 hour(s)). No results found.    Assessment and Plan: 38 y.o. female with  Right lateral hip pain: Trochanteric bursitis versus hip abductor tendinopathy.  Plan for home exercise program stretching and strengthening.  Recheck in about a month if not improving would consider physical therapy or injection.  Right plantar foot pain: Plantar fasciitis.  Plan for heel cushion ice massage eccentric exercises and appropriate footwear.  Recheck in 1 month.  Mood: Improving with medication adherence.  Recheck in near future.  PDMP not reviewed this encounter. No orders of the defined types were placed in this encounter.  No  orders of the defined types were placed in this encounter.    Historical information moved to improve visibility of documentation.  Past Medical History:  Diagnosis Date  . Abnormal Pap smear of cervix 2005   Mod dysplasia  . Depression   . GERD (gastroesophageal reflux disease)   . High cholesterol   . Nonocclusive mesenteric ischemia ([place]) 04/04/2018   Dx colonscopy Aug 2019 [facility]  . OSA (obstructive sleep apnea) 07/05/2016  . Panic attack   . PCOS (polycystic ovarian syndrome)    Past Surgical History:  Procedure Laterality Date  . KNEE SURGERY    . LEEP    . TONSILLECTOMY    . WISDOM TOOTH EXTRACTION     Social History   Tobacco Use  . Smoking status: Current Every Day Smoker    Packs/day: 0.50    Years: 15.00    Pack years: 7.50    Types: Cigarettes  . Smokeless tobacco: Never Used  . Tobacco comment: using vapes  Substance Use Topics  . Alcohol use: Yes    Alcohol/week: 0.0 standard drinks   family history includes GER disease in her father; Heart attack in an other family member; Hypertension in her mother; Migraines in her father; Thyroid disease in her brother and mother.  Medications: Current Outpatient Medications  Medication Sig Dispense Refill  . Calcium Carbonate Antacid (ANTACID PO) Take by mouth.    . famotidine (PEPCID) 20 MG tablet Take 1 tablet (20 mg total) by mouth 2 (two) times daily as needed for heartburn or indigestion. 180 tablet 3  . LORazepam (ATIVAN) 1 MG tablet Take 1 tablet (1 mg total) by mouth every 8 (eight) hours as needed for anxiety. 60 tablet 0  . medroxyPROGESTERone (PROVERA) 10 MG tablet 1 PILL DAILY FOR 10 DAYS EVERY MONTH FOR MENSTRUAL CYCLE. 10 tablet 3  . methocarbamol (ROBAXIN) 500 MG tablet Take 1 tablet (500 mg total) by mouth 4 (four) times daily. 120 tablet 0  . naproxen (NAPROSYN) 375 MG tablet Take 1 tablet (375 mg total) by mouth 2 (two) times daily as needed. 60 tablet 3  . Vilazodone HCl (VIIBRYD) 40 MG TABS Take 1  tablet (40 mg total) by mouth daily. 90 tablet 0   No current facility-administered medications for this visit.    Allergies  Allergen Reactions  . Celexa [Citalopram Hydrobromide]   . Ibuprofen     High doses  . Prednisone   . Prilosec [Omeprazole]   . Wellbutrin [Bupropion]   . Flexeril [Cyclobenzaprine] Other (See Comments)    Dreams     Discussed warning signs or symptoms. Please see discharge instructions. Patient expresses understanding.

## 2018-12-30 ENCOUNTER — Ambulatory Visit (INDEPENDENT_AMBULATORY_CARE_PROVIDER_SITE_OTHER): Payer: Self-pay | Admitting: Family Medicine

## 2018-12-30 ENCOUNTER — Ambulatory Visit (INDEPENDENT_AMBULATORY_CARE_PROVIDER_SITE_OTHER): Payer: Self-pay

## 2018-12-30 ENCOUNTER — Encounter: Payer: Self-pay | Admitting: Family Medicine

## 2018-12-30 ENCOUNTER — Other Ambulatory Visit: Payer: Self-pay

## 2018-12-30 VITALS — BP 112/80 | HR 84 | Temp 98.3°F | Ht 69.25 in | Wt 304.0 lb

## 2018-12-30 DIAGNOSIS — M79652 Pain in left thigh: Secondary | ICD-10-CM

## 2018-12-30 MED ORDER — METHOCARBAMOL 500 MG PO TABS: 500.0000 mg | ORAL_TABLET | Freq: Four times a day (QID) | ORAL | 1 refills | Status: DC

## 2018-12-30 MED ORDER — FAMOTIDINE 20 MG PO TABS: 20.0000 mg | ORAL_TABLET | Freq: Two times a day (BID) | ORAL | 3 refills | Status: DC | PRN
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
## 2018-12-30 NOTE — Patient Instructions (Signed)
Thank you for coming in today.  For your hip work on straight leg raises.  Also do the standing hip flexor stretch.  Ok to advance exercises.  Goal is for 30 straight leg raises in a row 2-3x daily.  Youtube may be a good option for other exercises as well.    I think you strained your hip flexor.

## 2019-01-06 ENCOUNTER — Telehealth: Payer: Self-pay | Admitting: Family Medicine

## 2019-01-06 DIAGNOSIS — M79652 Pain in left thigh: Secondary | ICD-10-CM

## 2019-01-06 DIAGNOSIS — M7061 Trochanteric bursitis, right hip: Secondary | ICD-10-CM

## 2019-01-06 NOTE — Telephone Encounter (Signed)
Referral placed for the [facility]. [facility] Due to the COIVD-19 outbreak, we are currently seeing patients through telehealth only. Telehealth will be prioritized first for patients who have established plans of care and for patients who recently had surgery. We are accepting new referrals and they can be faxed to [phone]. We are seeking ways to address our waitlist in these unusual times and appreciate your patience as we make decisions. We are available to take your call and answer any questions at [phone]. Please Click here here for the most up to date COVID-19 information and for the [facility]'s sick policy. Thank you!

## 2019-01-06 NOTE — Telephone Encounter (Signed)
Dr. Georgina Snell    Disregard previous message patient does not have insurance it is inactive I will notify [facility] and let them know so they will schedule patient . -CF

## 2019-01-06 NOTE — Telephone Encounter (Signed)
Dr Georgina Snell   I received a fax from [facility] stating they can not see the patient due to having insurance. They stated we could send her to the [facility] facility next door to them. Please advise - CF

## 2019-01-06 NOTE — Telephone Encounter (Signed)
-----   Message from Narda Rutherford, [place] sent at 12/31/2018  8:03 AM EDT ----- Patient advised of results and recommendations. She wants to go ahead with PT.

## 2019-01-15 NOTE — Telephone Encounter (Signed)
Was this done?

## 2019-01-15 NOTE — Telephone Encounter (Signed)
Cindy not in office today.Delsa Sale, can you please look into this?

## 2019-01-19 NOTE — Telephone Encounter (Signed)
Ariana Parks

## 2019-01-21 NOTE — Telephone Encounter (Signed)
Sent referral to [facility] they will contact patient due to the type of clinic sometimes it takes a little while to get an opening - CF

## 2019-03-31 ENCOUNTER — Encounter: Payer: Self-pay | Admitting: Family Medicine

## 2019-03-31 ENCOUNTER — Ambulatory Visit (INDEPENDENT_AMBULATORY_CARE_PROVIDER_SITE_OTHER): Payer: Self-pay | Admitting: Family Medicine

## 2019-03-31 VITALS — BP 116/76 | HR 76 | Temp 97.6°F | Wt 306.0 lb

## 2019-03-31 DIAGNOSIS — F331 Major depressive disorder, recurrent, moderate: Secondary | ICD-10-CM

## 2019-03-31 DIAGNOSIS — M545 Low back pain: Secondary | ICD-10-CM

## 2019-03-31 DIAGNOSIS — F419 Anxiety disorder, unspecified: Secondary | ICD-10-CM

## 2019-03-31 DIAGNOSIS — G8929 Other chronic pain: Secondary | ICD-10-CM

## 2019-03-31 MED ORDER — METHOCARBAMOL 500 MG PO TABS: 1000.0000 mg | ORAL_TABLET | Freq: Four times a day (QID) | ORAL | 1 refills | Status: DC | PRN

## 2019-03-31 MED ORDER — NAPROXEN 375 MG PO TABS: 375.0000 mg | ORAL_TABLET | Freq: Two times a day (BID) | ORAL | 3 refills | Status: DC | PRN

## 2019-03-31 MED ORDER — FAMOTIDINE 20 MG PO TABS: 20.0000 mg | ORAL_TABLET | Freq: Two times a day (BID) | ORAL | 3 refills | Status: DC | PRN

## 2019-03-31 MED ORDER — LORAZEPAM 1 MG PO TABS: 1.0000 mg | ORAL_TABLET | Freq: Three times a day (TID) | ORAL | 3 refills | Status: DC | PRN

## 2019-03-31 NOTE — Patient Instructions (Signed)
Thank you for coming in today. Use robaxin 1-2 tabs every 6 hours as needed for muscle spasm and pain.  Use heating pad.  Use TENS unit.  Stay activty and do stretches.  Recheck as needed.   Recommend establish with Dr Zigmund Daniel or Dr T as he sees your brother.

## 2019-03-31 NOTE — Progress Notes (Signed)
Ariana Parks is a 38 y.o. female who presents to [facility] today for back pain.  [name] notes continued/worsening low back pain.  She has had ongoing pain in the low back for years but worse over the last 3 to 4 months.  She notes pain in the bilateral lower back worse with activity.  She works at [employer] and has been busier than normal recently.  She notes with it when she standing and walking at work her pain is worse.  She has better pain control at rest.  She is taking Robaxin and naproxen which do help but not sufficiently.  She wonders if a higher dose of Robaxin would help.  She also sometimes uses a heating pad.  She denies any pain radiating down her leg weakness or numbness distally.  Additionally she has a history of anxiety that is moderately managed with Viibryd and lorazepam.  She thinks her anxiety is about as well controlled as is never going to be.  Additionally she has acid reflux for which she takes famotidine which typically controls her symptoms quite well.  ROS as above:  Exam:  BP 116/76   Pulse 76   Temp 97.6 F (36.4 C) (Oral)   Wt (!) 306 lb (138.8 kg)   BMI 44.86 kg/m  Wt Readings from Last 5 Encounters:  03/31/19 (!) 306 lb (138.8 kg)  12/30/18 (!) 304 lb (137.9 kg)  11/11/18 (!) 301 lb (136.5 kg)  11/06/18 296 lb (134.3 kg)  10/13/18 298 lb (135.2 kg)    Gen: Well NAD HEENT: EOMI,  MMM Lungs: Normal work of breathing. CTABL Heart: RRR no MRG Abd: NABS, Soft. Nondistended, Nontender Exts: Brisk capillary refill, warm and well perfused.   Lab and Radiology Results No results found for this or any previous visit (from the past 72 hour(s)). No results found.    Assessment and Plan: 38 y.o. female with  Back pain: Lumbosacral dysfunction and weakness with some degenerative changes.  Plan to proceed with continued home  exercise program heating pad TENS unit.  Increase Robaxin dose as tolerated.  If not improving would hopefully get into physical therapy although she is self-pay and physical therapy is quite expensive.  Mood: Moderately controlled with Viibryd and limited Ativan.  Refill as needed.  Acid reflux again controlled with famotidine continue current regimen.  PDMP reviewed during this encounter. No orders of the defined types were placed in this encounter.  Meds ordered this encounter  Medications  . famotidine (PEPCID) 20 MG tablet    Sig: Take 1 tablet (20 mg total) by mouth 2 (two) times daily as needed for heartburn or indigestion.    Dispense:  180 tablet    Refill:  3  . LORazepam (ATIVAN) 1 MG tablet    Sig: Take 1 tablet (1 mg total) by mouth every 8 (eight) hours as needed for anxiety.    Dispense:  60 tablet    Refill:  3  . naproxen (NAPROSYN) 375 MG tablet    Sig: Take 1 tablet (375 mg total) by mouth 2 (two) times daily as needed.    Dispense:  60 tablet    Refill:  3  . methocarbamol (ROBAXIN) 500 MG tablet    Sig: Take 2 tablets (1,000 mg total) by mouth 4 (four) times daily as needed for muscle spasms.    Dispense:  240 tablet    Refill:  1  Historical information moved to improve visibility of documentation.  Past Medical History:  Diagnosis Date  . Abnormal Pap smear of cervix 2005   Mod dysplasia  . Depression   . GERD (gastroesophageal reflux disease)   . High cholesterol   . Nonocclusive mesenteric ischemia ([place]) 04/04/2018   Dx colonscopy Aug 2019 [facility]  . OSA (obstructive sleep apnea) 07/05/2016  . Panic attack   . PCOS (polycystic ovarian syndrome)    Past Surgical History:  Procedure Laterality Date  . KNEE SURGERY    . LEEP    . TONSILLECTOMY    . WISDOM TOOTH EXTRACTION     Social History   Tobacco Use  . Smoking status: Current Every Day Smoker    Packs/day: 0.50    Years: 15.00    Pack years: 7.50    Types: Cigarettes  . Smokeless  tobacco: Never Used  . Tobacco comment: using vapes  Substance Use Topics  . Alcohol use: Yes    Alcohol/week: 0.0 standard drinks   family history includes GER disease in her father; Heart attack in an other family member; Hypertension in her mother; Migraines in her father; Thyroid disease in her brother and mother.  Medications: Current Outpatient Medications  Medication Sig Dispense Refill  . famotidine (PEPCID) 20 MG tablet Take 1 tablet (20 mg total) by mouth 2 (two) times daily as needed for heartburn or indigestion. 180 tablet 3  . LORazepam (ATIVAN) 1 MG tablet Take 1 tablet (1 mg total) by mouth every 8 (eight) hours as needed for anxiety. 60 tablet 3  . medroxyPROGESTERone (PROVERA) 10 MG tablet 1 PILL DAILY FOR 10 DAYS EVERY MONTH FOR MENSTRUAL CYCLE. 10 tablet 3  . methocarbamol (ROBAXIN) 500 MG tablet Take 2 tablets (1,000 mg total) by mouth 4 (four) times daily as needed for muscle spasms. 240 tablet 1  . naproxen (NAPROSYN) 375 MG tablet Take 1 tablet (375 mg total) by mouth 2 (two) times daily as needed. 60 tablet 3  . Vilazodone HCl (VIIBRYD) 40 MG TABS Take 1 tablet (40 mg total) by mouth daily. 90 tablet 0   No current facility-administered medications for this visit.    Allergies  Allergen Reactions  . Celexa [Citalopram Hydrobromide]   . Ibuprofen     High doses  . Prednisone   . Prilosec [Omeprazole]   . Wellbutrin [Bupropion]   . Flexeril [Cyclobenzaprine] Other (See Comments)    Dreams     Discussed warning signs or symptoms. Please see discharge instructions. Patient expresses understanding.

## 2019-12-03 ENCOUNTER — Other Ambulatory Visit: Payer: Self-pay | Admitting: Family Medicine

## 2019-12-03 DIAGNOSIS — N926 Irregular menstruation, unspecified: Secondary | ICD-10-CM

## 2019-12-29 ENCOUNTER — Ambulatory Visit (INDEPENDENT_AMBULATORY_CARE_PROVIDER_SITE_OTHER): Payer: BC Managed Care – PPO | Admitting: Family Medicine

## 2019-12-29 ENCOUNTER — Encounter: Payer: Self-pay | Admitting: Family Medicine

## 2019-12-29 VITALS — BP 116/67 | HR 78 | Temp 97.7°F | Ht 69.29 in | Wt 284.5 lb

## 2019-12-29 DIAGNOSIS — F331 Major depressive disorder, recurrent, moderate: Secondary | ICD-10-CM | POA: Diagnosis not present

## 2019-12-29 DIAGNOSIS — N926 Irregular menstruation, unspecified: Secondary | ICD-10-CM | POA: Diagnosis not present

## 2019-12-29 DIAGNOSIS — E282 Polycystic ovarian syndrome: Secondary | ICD-10-CM | POA: Diagnosis not present

## 2019-12-29 MED ORDER — MEDROXYPROGESTERONE ACETATE 10 MG PO TABS: ORAL_TABLET | ORAL | 6 refills | Status: DC

## 2019-12-29 MED ORDER — ONDANSETRON 4 MG PO TBDP: 4.0000 mg | ORAL_TABLET | Freq: Three times a day (TID) | ORAL | 1 refills | Status: DC | PRN

## 2019-12-29 MED ORDER — VIIBRYD 40 MG PO TABS: 40.0000 mg | ORAL_TABLET | Freq: Every day | ORAL | 0 refills | Status: DC

## 2019-12-29 NOTE — Progress Notes (Signed)
Ariana Parks - 39 y.o. female MRN [id number]  Date of birth: [birth year]  Subjective Chief Complaint  Patient presents with  . Medication Refill    HPI Ariana Parks is a 39 y.o. female with PCOS,uterine fibroids, depression and anxiety, and chronic nausea here today for follow up visit.   -PCOS:  History of PCOS with associated prediabetes. She has associated heavy anovulatory bleeding and has been taking provera monthly.  This has worked well for her but has been out of medication for a couple of months.  She has seen GYN previously and plans to schedule follow up.    -Depression/anxiety:  She reports that depression and anxiety symptoms have worsened recently.  She was taking Viibryd previously as well as ativan as needed.  She has been off of this for a little over 1 year.  She feels like she needs to be back on this but is concerned about nausea when re-starting.  She has chronic nausea but this is worsened by the medication.  She has tried pantoprazole without improvement of her nausea.  She has not tried zofran.    ROS:  A comprehensive ROS was completed and negative except as noted per HPI  Allergies  Allergen Reactions  . Celexa [Citalopram Hydrobromide]   . Ibuprofen     High doses  . Prednisone   . Prilosec [Omeprazole]   . Wellbutrin [Bupropion]   . Flexeril [Cyclobenzaprine] Other (See Comments)    Dreams    Past Medical History:  Diagnosis Date  . Abnormal Pap smear of cervix 2005   Mod dysplasia  . Depression   . GERD (gastroesophageal reflux disease)   . High cholesterol   . Nonocclusive mesenteric ischemia (Foley) 04/04/2018   Dx colonscopy Aug 2019 [facility]  . OSA (obstructive sleep apnea) 07/05/2016  . Panic attack   . PCOS (polycystic ovarian syndrome)     Past Surgical History:  Procedure Laterality Date  . KNEE SURGERY    . LEEP    . TONSILLECTOMY    . WISDOM TOOTH EXTRACTION      Social History   Socioeconomic History  . Marital status:  Single    Spouse name: Not on file  . Number of children: Not on file  . Years of education: Not on file  . Highest education level: Not on file  Occupational History  . Occupation: Designer, jewellery  Tobacco Use  . Smoking status: Current Every Day Smoker    Packs/day: 0.50    Years: 15.00    Pack years: 7.50    Types: Cigarettes  . Smokeless tobacco: Never Used  . Tobacco comment: using vapes  Substance and Sexual Activity  . Alcohol use: Yes    Alcohol/week: 0.0 standard drinks  . Drug use: No  . Sexual activity: Not Currently    Partners: Male    Birth control/protection: None  Other Topics Concern  . Not on file  Social History Narrative  . Not on file   Social Determinants of Health   Financial Resource Strain:   . Difficulty of Paying Living Expenses:   Food Insecurity:   . Worried About Charity fundraiser in the Last Year:   . Arboriculturist in the Last Year:   Transportation Needs:   . Film/video editor (Medical):   Marland Kitchen Lack of Transportation (Non-Medical):   Physical Activity:   . Days of Exercise per Week:   . Minutes of Exercise per Session:   Stress:   .  Feeling of Stress :   Social Connections:   . Frequency of Communication with Friends and Family:   . Frequency of Social Gatherings with Friends and Family:   . Attends Religious Services:   . Active Member of Clubs or Organizations:   . Attends Archivist Meetings:   Marland Kitchen Marital Status:     Family History  Problem Relation Age of Onset  . Hypertension Mother   . Thyroid disease Mother   . GER disease Father   . Migraines Father   . Heart attack Other   . Thyroid disease Brother     Health Maintenance  Topic Date Due  . Hepatitis C Screening  Never done  . COVID-19 Vaccine (1) Never done  . HIV Screening  Never done  . PAP SMEAR-Modifier  08/09/2007  . INFLUENZA VACCINE  01/24/2020  . COLONOSCOPY  02/07/2023  . TETANUS/TDAP  12/03/2027      ----------------------------------------------------------------------------------------------------------------------------------------------------------------------------------------------------------------- Physical Exam BP 116/67 (BP Location: Left Arm, Patient Position: Sitting, Cuff Size: Large)   Pulse 78   Temp 97.7 F (36.5 C)   Ht 5' 9.29" (1.76 m)   Wt 284 lb 8 oz (129 kg)   SpO2 95%   BMI 41.66 kg/m   Physical Exam Constitutional:      Appearance: Normal appearance.  Eyes:     General: No scleral icterus. Cardiovascular:     Rate and Rhythm: Normal rate and regular rhythm.  Musculoskeletal:     Cervical back: Neck supple.  Neurological:     General: No focal deficit present.     Mental Status: She is alert.  Psychiatric:        Mood and Affect: Mood normal.     ------------------------------------------------------------------------------------------------------------------------------------------------------------------------------------------------------------------- Assessment and Plan  PCOS (polycystic ovarian syndrome) Historically has done well with provera.  Will restart this at previous dosing  MDD (major depressive disorder) She has tried multiple medications for management of depression.   PHQ score is significantly elevated today with score of 2 for SI.  She denies any plan of self harm and feelings are passive.  She states she would never act on any of these feelings.   Viibryd has worked well for her previously however she has had nausea with this.  Will restart along with zofran as needed.  She has seen therapist int he past but has had trouble scheduling follow up visit with her.  New referral entered.  Follow up in 2 weeks.     Meds ordered this encounter  Medications  . Vilazodone HCl (VIIBRYD) 40 MG TABS    Sig: Take 1 tablet (40 mg total) by mouth daily.    Dispense:  90 tablet    Refill:  0  . medroxyPROGESTERone (PROVERA) 10 MG  tablet    Sig: 1 pill daily x10 days each month for menstrual cycle.    Dispense:  10 tablet    Refill:  6    DX Code Needed  .  . ondansetron (ZOFRAN ODT) 4 MG disintegrating tablet    Sig: Take 1 tablet (4 mg total) by mouth every 8 (eight) hours as needed for nausea or vomiting.    Dispense:  30 tablet    Refill:  1    Return in about 2 weeks (around 01/12/2020) for Mood.    This visit occurred during the SARS-CoV-2 public health emergency.  Safety protocols were in place, including screening questions prior to the visit, additional usage of staff PPE, and extensive cleaning of exam  room while observing appropriate contact time as indicated for disinfecting solutions.

## 2019-12-29 NOTE — Assessment & Plan Note (Signed)
She has tried multiple medications for management of depression.   PHQ score is significantly elevated today with score of 2 for SI.  She denies any plan of self harm and feelings are passive.  She states she would never act on any of these feelings.   Viibryd has worked well for her previously however she has had nausea with this.  Will restart along with zofran as needed.  She has seen therapist int he past but has had trouble scheduling follow up visit with her.  New referral entered.  Follow up in 2 weeks.

## 2019-12-29 NOTE — Patient Instructions (Addendum)
Great to meet you today! Please restart Viibryd.  You may use zofran to help with nausea associated with this.  I have entered referral for counseling as well to [facility].   See me again in 2 weeks.    Living With Depression Everyone experiences occasional disappointment, sadness, and loss in their lives. When you are feeling down, blue, or sad for at least 2 weeks in a row, it may mean that you have depression. Depression can affect your thoughts and feelings, relationships, daily activities, and physical health. It is caused by changes in the way your brain functions. If you receive a diagnosis of depression, your health care provider will tell you which type of depression you have and what treatment options are available to you. If you are living with depression, there are ways to help you recover from it and also ways to prevent it from coming back. How to cope with lifestyle changes Coping with stress     Stress is your body's reaction to life changes and events, both good and bad. Stressful situations may include:  Getting married.  The death of a spouse.  Losing a job.  Retiring.  Having a baby. Stress can last just a few hours or it can be ongoing. Stress can play a major role in depression, so it is important to learn both how to cope with stress and how to think about it differently. Talk with your health care provider or a counselor if you would like to learn more about stress reduction. He or she may suggest some stress reduction techniques, such as:  Music therapy. This can include creating music or listening to music. Choose music that you enjoy and that inspires you.  Mindfulness-based meditation. This kind of meditation can be done while sitting or walking. It involves being aware of your normal breaths, rather than trying to control your breathing.  Centering prayer. This is a kind of meditation that involves focusing on a spiritual word or phrase. Choose a  word, phrase, or sacred image that is meaningful to you and that brings you peace.  Deep breathing. To do this, expand your stomach and inhale slowly through your nose. Hold your breath for 3-5 seconds, then exhale slowly, allowing your stomach muscles to relax.  Muscle relaxation. This involves intentionally tensing muscles then relaxing them. Choose a stress reduction technique that fits your lifestyle and personality. Stress reduction techniques take time and practice to develop. Set aside 5-15 minutes a day to do them. Therapists can offer training in these techniques. The training may be covered by some insurance plans. Other things you can do to manage stress include:  Keeping a stress diary. This can help you learn what triggers your stress and ways to control your response.  Understanding what your limits are and saying no to requests or events that lead to a schedule that is too full.  Thinking about how you respond to certain situations. You may not be able to control everything, but you can control how you react.  Adding humor to your life by watching funny films or TV shows.  Making time for activities that help you relax and not feeling guilty about spending your time this way.  Medicines Your health care provider may suggest certain medicines if he or she feels that they will help improve your condition. Avoid using alcohol and other substances that may prevent your medicines from working properly (may interact). It is also important to:  Talk with your  pharmacist or health care provider about all the medicines that you take, their possible side effects, and what medicines are safe to take together.  Make it your goal to take part in all treatment decisions (shared decision-making). This includes giving input on the side effects of medicines. It is best if shared decision-making with your health care provider is part of your total treatment plan. If your health care provider  prescribes a medicine, you may not notice the full benefits of it for 4-8 weeks. Most people who are treated for depression need to be on medicine for at least 6-12 months after they feel better. If you are taking medicines as part of your treatment, do not stop taking medicines without first talking to your health care provider. You may need to have the medicine slowly decreased (tapered) over time to decrease the risk of harmful side effects. Relationships Your health care provider may suggest family therapy along with individual therapy and drug therapy. While there may not be family problems that are causing you to feel depressed, it is still important to make sure your family learns as much as they can about your mental health. Having your family's support can help make your treatment successful. How to recognize changes in your condition Everyone has a different response to treatment for depression. Recovery from major depression happens when you have not had signs of major depression for two months. This may mean that you will start to:  Have more interest in doing activities.  Feel less hopeless than you did 2 months ago.  Have more energy.  Overeat less often, or have better or improving appetite.  Have better concentration. Your health care provider will work with you to decide the next steps in your recovery. It is also important to recognize when your condition is getting worse. Watch for these signs:  Having fatigue or low energy.  Eating too much or too little.  Sleeping too much or too little.  Feeling restless, agitated, or hopeless.  Having trouble concentrating or making decisions.  Having unexplained physical complaints.  Feeling irritable, angry, or aggressive. Get help as soon as you or your family members notice these symptoms coming back. How to get support and help from others How to talk with friends and family members about your condition  Talking to  friends and family members about your condition can provide you with one way to get support and guidance. Reach out to trusted friends or family members, explain your symptoms to them, and let them know that you are working with a health care provider to treat your depression. Financial resources Not all insurance plans cover mental health care, so it is important to check with your insurance carrier. If paying for co-pays or counseling services is a problem, search for a local or county mental health care center. They may be able to offer public mental health care services at low or no cost when you are not able to see a private health care provider. If you are taking medicine for depression, you may be able to get the generic form, which may be less expensive. Some makers of prescription medicines also offer help to patients who cannot afford the medicines they need. Follow these instructions at home:   Get the right amount and quality of sleep.  Cut down on using caffeine, tobacco, alcohol, and other potentially harmful substances.  Try to exercise, such as walking or lifting small weights.  Take over-the-counter and prescription medicines only  as told by your health care provider.  Eat a healthy diet that includes plenty of vegetables, fruits, whole grains, low-fat dairy products, and lean protein. Do not eat a lot of foods that are high in solid fats, added sugars, or salt.  Keep all follow-up visits as told by your health care provider. This is important. Contact a health care provider if:  You stop taking your antidepressant medicines, and you have any of these symptoms: ? Nausea. ? Headache. ? Feeling lightheaded. ? Chills and body aches. ? Not being able to sleep (insomnia).  You or your friends and family think your depression is getting worse. Get help right away if:  You have thoughts of hurting yourself or others. If you ever feel like you may hurt yourself or others, or  have thoughts about taking your own life, get help right away. You can go to your nearest emergency department or call:  Your local emergency services (911 in the [place]  A suicide crisis helpline, such as the [facility] at (256)554-3848. This is open 24-hours a day. Summary  If you are living with depression, there are ways to help you recover from it and also ways to prevent it from coming back.  Work with your health care team to create a management plan that includes counseling, stress management techniques, and healthy lifestyle habits. This information is not intended to replace advice given to you by your health care provider. Make sure you discuss any questions you have with your health care provider. Document Revised: 10/03/2018 Document Reviewed: 05/14/2016 Elsevier Patient Education  [employer].

## 2019-12-29 NOTE — Assessment & Plan Note (Signed)
Historically has done well with provera.  Will restart this at previous dosing

## 2019-12-30 ENCOUNTER — Telehealth: Payer: Self-pay | Admitting: Family Medicine

## 2019-12-30 NOTE — Telephone Encounter (Signed)
Received call from Ariana Parks stating her Viibryd was going to cost a lot of money I sent through cover my meds and received authorization from 12/30/2019 - 12/28/2022. Ariana Parks is going to call and let Ariana Parks know. - CF

## 2019-12-31 ENCOUNTER — Telehealth: Payer: Self-pay

## 2019-12-31 NOTE — Telephone Encounter (Signed)
LVM for patient advising that Viibryd Rx has received Prior Auth approval.   Advised patient to contact the pharmacy for the medication.   Callback for any questions. Direct callback info provided.

## 2020-01-14 ENCOUNTER — Ambulatory Visit (INDEPENDENT_AMBULATORY_CARE_PROVIDER_SITE_OTHER): Payer: BC Managed Care – PPO | Admitting: Family Medicine

## 2020-01-14 ENCOUNTER — Encounter: Payer: Self-pay | Admitting: Family Medicine

## 2020-01-14 ENCOUNTER — Other Ambulatory Visit: Payer: Self-pay

## 2020-01-14 DIAGNOSIS — F1210 Cannabis abuse, uncomplicated: Secondary | ICD-10-CM | POA: Diagnosis not present

## 2020-01-14 DIAGNOSIS — F331 Major depressive disorder, recurrent, moderate: Secondary | ICD-10-CM | POA: Diagnosis not present

## 2020-01-14 DIAGNOSIS — F411 Generalized anxiety disorder: Secondary | ICD-10-CM | POA: Diagnosis not present

## 2020-01-14 MED ORDER — LORAZEPAM 1 MG PO TABS: 1.0000 mg | ORAL_TABLET | Freq: Three times a day (TID) | ORAL | 3 refills | Status: DC | PRN

## 2020-01-14 NOTE — Patient Instructions (Signed)
Living With Depression Everyone experiences occasional disappointment, sadness, and loss in their lives. When you are feeling down, blue, or sad for at least 2 weeks in a row, it may mean that you have depression. Depression can affect your thoughts and feelings, relationships, daily activities, and physical health. It is caused by changes in the way your brain functions. If you receive a diagnosis of depression, your health care provider will tell you which type of depression you have and what treatment options are available to you. If you are living with depression, there are ways to help you recover from it and also ways to prevent it from coming back. How to cope with lifestyle changes Coping with stress     Stress is your body's reaction to life changes and events, both good and bad. Stressful situations may include:  Getting married.  The death of a spouse.  Losing a job.  Retiring.  Having a baby. Stress can last just a few hours or it can be ongoing. Stress can play a major role in depression, so it is important to learn both how to cope with stress and how to think about it differently. Talk with your health care provider or a counselor if you would like to learn more about stress reduction. He or she may suggest some stress reduction techniques, such as:  Music therapy. This can include creating music or listening to music. Choose music that you enjoy and that inspires you.  Mindfulness-based meditation. This kind of meditation can be done while sitting or walking. It involves being aware of your normal breaths, rather than trying to control your breathing.  Centering prayer. This is a kind of meditation that involves focusing on a spiritual word or phrase. Choose a word, phrase, or sacred image that is meaningful to you and that brings you peace.  Deep breathing. To do this, expand your stomach and inhale slowly through your nose. Hold your breath for 3-5 seconds, then exhale  slowly, allowing your stomach muscles to relax.  Muscle relaxation. This involves intentionally tensing muscles then relaxing them. Choose a stress reduction technique that fits your lifestyle and personality. Stress reduction techniques take time and practice to develop. Set aside 5-15 minutes a day to do them. Therapists can offer training in these techniques. The training may be covered by some insurance plans. Other things you can do to manage stress include:  Keeping a stress diary. This can help you learn what triggers your stress and ways to control your response.  Understanding what your limits are and saying no to requests or events that lead to a schedule that is too full.  Thinking about how you respond to certain situations. You may not be able to control everything, but you can control how you react.  Adding humor to your life by watching funny films or TV shows.  Making time for activities that help you relax and not feeling guilty about spending your time this way.  Medicines Your health care provider may suggest certain medicines if he or she feels that they will help improve your condition. Avoid using alcohol and other substances that may prevent your medicines from working properly (may interact). It is also important to:  Talk with your pharmacist or health care provider about all the medicines that you take, their possible side effects, and what medicines are safe to take together.  Make it your goal to take part in all treatment decisions (shared decision-making). This includes giving input on   the side effects of medicines. It is best if shared decision-making with your health care provider is part of your total treatment plan. If your health care provider prescribes a medicine, you may not notice the full benefits of it for 4-8 weeks. Most people who are treated for depression need to be on medicine for at least 6-12 months after they feel better. If you are taking  medicines as part of your treatment, do not stop taking medicines without first talking to your health care provider. You may need to have the medicine slowly decreased (tapered) over time to decrease the risk of harmful side effects. Relationships Your health care provider may suggest family therapy along with individual therapy and drug therapy. While there may not be family problems that are causing you to feel depressed, it is still important to make sure your family learns as much as they can about your mental health. Having your family's support can help make your treatment successful. How to recognize changes in your condition Everyone has a different response to treatment for depression. Recovery from major depression happens when you have not had signs of major depression for two months. This may mean that you will start to:  Have more interest in doing activities.  Feel less hopeless than you did 2 months ago.  Have more energy.  Overeat less often, or have better or improving appetite.  Have better concentration. Your health care provider will work with you to decide the next steps in your recovery. It is also important to recognize when your condition is getting worse. Watch for these signs:  Having fatigue or low energy.  Eating too much or too little.  Sleeping too much or too little.  Feeling restless, agitated, or hopeless.  Having trouble concentrating or making decisions.  Having unexplained physical complaints.  Feeling irritable, angry, or aggressive. Get help as soon as you or your family members notice these symptoms coming back. How to get support and help from others How to talk with friends and family members about your condition  Talking to friends and family members about your condition can provide you with one way to get support and guidance. Reach out to trusted friends or family members, explain your symptoms to them, and let them know that you are  working with a health care provider to treat your depression. Financial resources Not all insurance plans cover mental health care, so it is important to check with your insurance carrier. If paying for co-pays or counseling services is a problem, search for a local or county mental health care center. They may be able to offer public mental health care services at low or no cost when you are not able to see a private health care provider. If you are taking medicine for depression, you may be able to get the generic form, which may be less expensive. Some makers of prescription medicines also offer help to patients who cannot afford the medicines they need. Follow these instructions at home:   Get the right amount and quality of sleep.  Cut down on using caffeine, tobacco, alcohol, and other potentially harmful substances.  Try to exercise, such as walking or lifting small weights.  Take over-the-counter and prescription medicines only as told by your health care provider.  Eat a healthy diet that includes plenty of vegetables, fruits, whole grains, low-fat dairy products, and lean protein. Do not eat a lot of foods that are high in solid fats, added sugars, or salt.    Keep all follow-up visits as told by your health care provider. This is important. Contact a health care provider if:  You stop taking your antidepressant medicines, and you have any of these symptoms: ? Nausea. ? Headache. ? Feeling lightheaded. ? Chills and body aches. ? Not being able to sleep (insomnia).  You or your friends and family think your depression is getting worse. Get help right away if:  You have thoughts of hurting yourself or others. If you ever feel like you may hurt yourself or others, or have thoughts about taking your own life, get help right away. You can go to your nearest emergency department or call:  Your local emergency services (911 in the [place]  A suicide crisis helpline, such as the  National Suicide Prevention Lifeline at 1-800-273-8255. This is open 24-hours a day. Summary  If you are living with depression, there are ways to help you recover from it and also ways to prevent it from coming back.  Work with your health care team to create a management plan that includes counseling, stress management techniques, and healthy lifestyle habits. This information is not intended to replace advice given to you by your health care provider. Make sure you discuss any questions you have with your health care provider. Document Revised: 10/03/2018 Document Reviewed: 05/14/2016 Elsevier Patient Education  2020 Elsevier Inc.  

## 2020-01-14 NOTE — Progress Notes (Signed)
Ariana Parks - 39 y.o. female MRN [id number]  Date of birth: 12-07-1980  Subjective Chief Complaint  Patient presents with  . mood    HPI Ariana Parks is a 39 y.o. female with history of depression with anxiety and PCOS here today for follow up visit.    She has not picked up viibryd yet but feels a little better compared to last visit.  She is requesting refill of her ativan.  She does not use this very often.   She denies current SI,  Denies any thoughts of SI for about 2 weeks.  She denies plan related to SI.    Depression screen [facility] 2/9 01/14/2020 12/29/2019 11/11/2018  Decreased Interest 2 3 2   Down, Depressed, Hopeless 3 3 3   PHQ - 2 Score 5 6 5   Altered sleeping 3 3 2   Tired, decreased energy 3 3 2   Change in appetite 3 3 2   Feeling bad or failure about yourself  2 3 2   Trouble concentrating 2 3 1   Moving slowly or fidgety/restless 2 3 2   Suicidal thoughts 2 2 2   PHQ-9 Score 22 26 18   Difficult doing work/chores Somewhat difficult Somewhat difficult Somewhat difficult  Some recent data might be hidden   GAD 7 : Generalized Anxiety Score 01/14/2020 11/11/2018 11/06/2018 10/13/2018  Nervous, Anxious, on Edge 2 2 2 3   Control/stop worrying 2 1 3 3   Worry too much - different things 2 2 3 3   Trouble relaxing 2 1 3 3   Restless 2 2 3 3   Easily annoyed or irritable 2 2 2 3   Afraid - awful might happen 2 1 2 2   Total GAD 7 Score 14 11 18 20   Anxiety Difficulty Somewhat difficult Somewhat difficult Somewhat difficult Very difficult     Allergies  Allergen Reactions  . Celexa [Citalopram Hydrobromide]   . Ibuprofen     High doses  . Prednisone   . Prilosec [Omeprazole]   . Wellbutrin [Bupropion]   . Flexeril [Cyclobenzaprine] Other (See Comments)    Dreams    Past Medical History:  Diagnosis Date  . Abnormal Pap smear of cervix 2005   Mod dysplasia  . Depression   . GERD (gastroesophageal reflux disease)   . High cholesterol   . 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 OSA (obstructive sleep apnea) 07/05/2016  . Panic attack   . PCOS (polycystic ovarian syndrome)     Past Surgical History:  Procedure Laterality Date  . KNEE SURGERY    . LEEP    . TONSILLECTOMY    . WISDOM TOOTH EXTRACTION      Social History   Socioeconomic History  . Marital status: Single    Spouse name: Not on file  . Number of children: Not on file  . Years of education: Not on file  . Highest education level: Not on file  Occupational History  . Occupation: Designer, jewellery  Tobacco Use  . Smoking status: Current Every Day Smoker    Packs/day: 0.50    Years: 15.00    Pack years: 7.50    Types: Cigarettes  . Smokeless tobacco: Never Used  . Tobacco comment: using vapes  Substance and Sexual Activity  . Alcohol use: Yes    Alcohol/week: 0.0 standard drinks  . Drug use: No  . Sexual activity: Not Currently    Partners: Male    Birth control/protection: None  Other Topics Concern  . Not on  file  Social History Narrative  . Not on file   Social Determinants of Health   Financial Resource Strain:   . Difficulty of Paying Living Expenses:   Food Insecurity:   . Worried About Charity fundraiser in the Last Year:   . Arboriculturist in the Last Year:   Transportation Needs:   . Film/video editor (Medical):   Marland Kitchen Lack of Transportation (Non-Medical):   Physical Activity:   . Days of Exercise per Week:   . Minutes of Exercise per Session:   Stress:   . Feeling of Stress :   Social Connections:   . Frequency of Communication with Friends and Family:   . Frequency of Social Gatherings with Friends and Family:   . Attends Religious Services:   . Active Member of Clubs or Organizations:   . Attends Archivist Meetings:   Marland Kitchen Marital Status:     Family History  Problem Relation Age of Onset  . Hypertension Mother   . Thyroid disease Mother   . GER disease Father   . Migraines Father    . Heart attack Other   . Thyroid disease Brother     Health Maintenance  Topic Date Due  . Hepatitis C Screening  Never done  . COVID-19 Vaccine (1) Never done  . HIV Screening  Never done  . PAP SMEAR-Modifier  08/09/2007  . INFLUENZA VACCINE  01/24/2020  . COLONOSCOPY  02/07/2023  . TETANUS/TDAP  12/03/2027     ----------------------------------------------------------------------------------------------------------------------------------------------------------------------------------------------------------------- Physical Exam BP 107/73 (BP Location: Left Arm, Patient Position: Sitting, Cuff Size: Large)   Pulse 74   Wt (!) 289 lb 1.3 oz (131.1 kg)   SpO2 97%   BMI 42.33 kg/m   Physical Exam Constitutional:      Appearance: Normal appearance.  HENT:     Head: Normocephalic and atraumatic.  Eyes:     General: No scleral icterus. Neurological:     General: No focal deficit present.     Mental Status: She is alert.  Psychiatric:        Mood and Affect: Mood normal.        Behavior: Behavior normal.     ------------------------------------------------------------------------------------------------------------------------------------------------------------------------------------------------------------------- Assessment and Plan  MDD (major depressive disorder) She has not picked up viibryd yet, will pick up today.  Lorazepam refilled.  Return in about 3 weeks (around 02/04/2020) for Mood f/u. .    Meds ordered this encounter  Medications  . LORazepam (ATIVAN) 1 MG tablet    Sig: Take 1 tablet (1 mg total) by mouth every 8 (eight) hours as needed for anxiety.    Dispense:  60 tablet    Refill:  3    Return in about 3 weeks (around 02/04/2020) for Mood f/u. Marland Kitchen    This visit occurred during the SARS-CoV-2 public health emergency.  Safety protocols were in place, including screening questions prior to the visit, additional usage of staff PPE, and  extensive cleaning of exam room while observing appropriate contact time as indicated for disinfecting solutions.

## 2020-01-14 NOTE — Assessment & Plan Note (Signed)
She has not picked up viibryd yet, will pick up today.  Lorazepam refilled.  Return in about 3 weeks (around 02/04/2020) for Mood f/u. Marland Kitchen

## 2020-01-19 ENCOUNTER — Telehealth: Payer: Self-pay

## 2020-01-19 ENCOUNTER — Other Ambulatory Visit: Payer: Self-pay | Admitting: Family Medicine

## 2020-01-19 MED ORDER — VIIBRYD 20 MG PO TABS: ORAL_TABLET | ORAL | 1 refills | Status: DC

## 2020-01-19 NOTE — Telephone Encounter (Signed)
Pt lvm stating she did not realize the Viibryd was 40mg .  She started taking the medicine yesterday and has vomitted x3. She would like to restart the medication at a lower dosage (10mg ), if possible.  Msg forwarded to Dr. Zigmund Daniel for review.

## 2020-01-19 NOTE — Telephone Encounter (Signed)
Rx for 20mg  tab sent in.  Start 1/2 tab x10 days then increase to full tab.

## 2020-01-20 NOTE — Telephone Encounter (Signed)
Patient's mother (on [place]) called for update. Advised her new RX sent in for taper up and for patient to call if any questions or complications

## 2020-02-08 ENCOUNTER — Encounter: Payer: Self-pay | Admitting: Family Medicine

## 2020-02-08 ENCOUNTER — Telehealth (INDEPENDENT_AMBULATORY_CARE_PROVIDER_SITE_OTHER): Payer: BC Managed Care – PPO | Admitting: Family Medicine

## 2020-02-08 DIAGNOSIS — F331 Major depressive disorder, recurrent, moderate: Secondary | ICD-10-CM

## 2020-02-08 MED ORDER — ONDANSETRON 4 MG PO TBDP: 4.0000 mg | ORAL_TABLET | Freq: Three times a day (TID) | ORAL | 1 refills | Status: DC | PRN

## 2020-02-08 NOTE — Assessment & Plan Note (Signed)
Denies any worsening symptoms but admits to increased stress related grandmother transitioning to hospice.  Will continue viibryd at current dose with f/u in 3-4 weeks.   Rx for zofran sent in pharmacy in [facility].

## 2020-02-08 NOTE — Progress Notes (Signed)
Ariana Parks - 39 y.o. female MRN [id number]  Date of birth: [birth year]   This visit type was conducted due to national recommendations for restrictions regarding the COVID-19 Pandemic (e.g. social distancing).  This format is felt to be most appropriate for this patient at this time.  All issues noted in this document were discussed and addressed.  No physical exam was performed (except for noted visual exam findings with Video Visits).  I discussed the limitations of evaluation and management by telemedicine and the availability of in person appointments. The patient expressed understanding and agreed to proceed.  I connected with@ on 02/08/20 at  2:00 PM EDT by a video enabled telemedicine application and verified that I am speaking with the correct person using two identifiers.  Present at visit: Ariana Parks, [place]   Patient Location: [place] [id number]   Provider location:   [facility]  Chief Complaint  Patient presents with   Depression    HPI  Ariana Parks is a 39 y.o. female who presents via audio/video conferencing for a telehealth visit today.  She is following up today for depression and anxiety.  Viibryd increased to 20mg  at last visit.  She continues to have some nausea with this due to medication.  This is well controlled with zofran as needed.  She finds it difficult to tell if Ariana Parks is working well as her grandmother was recently moved to hospice care and she is in [facility] to be with family.   This has created some increased stress and anxiety for her. She did forget her zofran and would like this sent to pharmacy in [facility].     She denies increased SI at this time.   Depression screen [facility] 2/9 02/08/2020 01/14/2020 12/29/2019  Decreased Interest 2 2 3   Down, Depressed, Hopeless 3 3 3   PHQ - 2 Score 5 5 6   Altered sleeping 3 3 3   Tired, decreased energy 3 3 3   Change in appetite 3 3 3   Feeling bad or failure about yourself  2 2 3    Trouble concentrating 2 2 3   Moving slowly or fidgety/restless 2 2 3   Suicidal thoughts 2 2 2   PHQ-9 Score 22 22 26   Difficult doing work/chores Somewhat difficult Somewhat difficult Somewhat difficult  Some recent data might be hidden      ROS:  A comprehensive ROS was completed and negative except as noted per HPI  Past Medical History:  Diagnosis Date   Abnormal Pap smear of cervix 2005   Mod dysplasia   Depression    GERD (gastroesophageal reflux disease)    High cholesterol    Nonocclusive mesenteric ischemia ([place]) 04/04/2018   Dx colonscopy Aug 2019 GAP   OSA (obstructive sleep apnea) 07/05/2016   Panic attack    PCOS (polycystic ovarian syndrome)     Past Surgical History:  Procedure Laterality Date   KNEE SURGERY     LEEP     TONSILLECTOMY     WISDOM TOOTH EXTRACTION      Family History  Problem Relation Age of Onset   Hypertension Mother    Thyroid disease Mother    GER disease Father    Migraines Father    Heart attack Other    Thyroid disease Brother     Social History   Socioeconomic History   Marital status: Single    Spouse name: Not on file   Number of children: Not on file   Years of  education: Not on file   Highest education level: Not on file  Occupational History   Occupation: Designer, jewellery  Tobacco Use   Smoking status: Current Every Day Smoker    Packs/day: 0.50    Years: 15.00    Pack years: 7.50    Types: Cigarettes   Smokeless tobacco: Never Used   Tobacco comment: using vapes  Substance and Sexual Activity   Alcohol use: Yes    Alcohol/week: 0.0 standard drinks   Drug use: No   Sexual activity: Not Currently    Partners: Male    Birth control/protection: None  Other Topics Concern   Not on file  Social History Narrative   Not on file   Social Determinants of Health   Financial Resource Strain:    Difficulty of Paying Living Expenses:   Food Insecurity:    Worried About Paediatric nurse in the Last Year:    Arboriculturist in the Last Year:   Transportation Needs:    Film/video editor (Medical):    Lack of Transportation (Non-Medical):   Physical Activity:    Days of Exercise per Week:    Minutes of Exercise per Session:   Stress:    Feeling of Stress :   Social Connections:    Frequency of Communication with Friends and Family:    Frequency of Social Gatherings with Friends and Family:    Attends Religious Services:    Active Member of Clubs or Organizations:    Attends Music therapist:    Marital Status:   Intimate Partner Violence:    Fear of Current or Ex-Partner:    Emotionally Abused:    Physically Abused:    Sexually Abused:      Current Outpatient Medications:    LORazepam (ATIVAN) 1 MG tablet, Take 1 tablet (1 mg total) by mouth every 8 (eight) hours as needed for anxiety., Disp: 60 tablet, Rfl: 3   medroxyPROGESTERone (PROVERA) 10 MG tablet, 1 pill daily x10 days each month for menstrual cycle., Disp: 10 tablet, Rfl: 6   methocarbamol (ROBAXIN) 500 MG tablet, Take 2 tablets (1,000 mg total) by mouth 4 (four) times daily as needed for muscle spasms., Disp: 240 tablet, Rfl: 1   ondansetron (ZOFRAN ODT) 4 MG disintegrating tablet, Take 1 tablet (4 mg total) by mouth every 8 (eight) hours as needed for nausea or vomiting., Disp: 30 tablet, Rfl: 1   Vilazodone HCl (VIIBRYD) 20 MG TABS, Take 1/2 tab x10 days then increase to full tab., Disp: 30 tablet, Rfl: 1  EXAM:  VITALS per patient if applicable: There were no vitals taken for this visit.  GENERAL: alert, oriented, appears well and in no acute distress  HEENT: atraumatic, conjunttiva clear, no obvious abnormalities on inspection of external nose and ears  NECK: normal movements of the head and neck  LUNGS: on inspection no signs of respiratory distress, breathing rate appears normal, no obvious gross SOB, gasping or wheezing  CV: no obvious  cyanosis  MS: moves all visible extremities without noticeable abnormality  PSYCH/NEURO: pleasant and cooperative, no obvious depression or anxiety, speech and thought processing grossly intact  ASSESSMENT AND PLAN:  Discussed the following assessment and plan:  MDD (major depressive disorder) Denies any worsening symptoms but admits to increased stress related grandmother transitioning to hospice.  Will continue viibryd at current dose with f/u in 3-4 weeks.   Rx for zofran sent in pharmacy in [facility].  I discussed the assessment and treatment plan with the patient. The patient was provided an opportunity to ask questions and all were answered. The patient agreed with the plan and demonstrated an understanding of the instructions.   The patient was advised to call back or seek an in-person evaluation if the symptoms worsen or if the condition fails to improve as anticipated.    Ariana Nutting, DO

## 2020-02-17 ENCOUNTER — Encounter: Payer: Self-pay | Admitting: Family Medicine

## 2020-02-17 DIAGNOSIS — Z0000 Encounter for general adult medical examination without abnormal findings: Secondary | ICD-10-CM

## 2020-02-17 DIAGNOSIS — R7303 Prediabetes: Secondary | ICD-10-CM

## 2020-02-17 NOTE — Telephone Encounter (Signed)
Pt is requesting annual labs. Order pended for provider's review.

## 2020-02-18 NOTE — Telephone Encounter (Signed)
Lab orders signed.

## 2020-02-24 DIAGNOSIS — Z79899 Other long term (current) drug therapy: Secondary | ICD-10-CM | POA: Diagnosis not present

## 2020-02-24 DIAGNOSIS — K0889 Other specified disorders of teeth and supporting structures: Secondary | ICD-10-CM | POA: Diagnosis not present

## 2020-02-24 DIAGNOSIS — Z886 Allergy status to analgesic agent status: Secondary | ICD-10-CM | POA: Diagnosis not present

## 2020-02-24 DIAGNOSIS — F99 Mental disorder, not otherwise specified: Secondary | ICD-10-CM | POA: Diagnosis not present

## 2020-02-24 DIAGNOSIS — Z791 Long term (current) use of non-steroidal anti-inflammatories (NSAID): Secondary | ICD-10-CM | POA: Diagnosis not present

## 2020-02-24 DIAGNOSIS — R6884 Jaw pain: Secondary | ICD-10-CM | POA: Diagnosis not present

## 2020-02-24 DIAGNOSIS — F17200 Nicotine dependence, unspecified, uncomplicated: Secondary | ICD-10-CM | POA: Diagnosis not present

## 2020-02-24 DIAGNOSIS — K029 Dental caries, unspecified: Secondary | ICD-10-CM | POA: Diagnosis not present

## 2020-02-24 DIAGNOSIS — Z888 Allergy status to other drugs, medicaments and biological substances status: Secondary | ICD-10-CM | POA: Diagnosis not present

## 2020-02-24 DIAGNOSIS — K219 Gastro-esophageal reflux disease without esophagitis: Secondary | ICD-10-CM | POA: Diagnosis not present

## 2020-02-24 DIAGNOSIS — K0401 Reversible pulpitis: Secondary | ICD-10-CM | POA: Diagnosis not present

## 2020-03-10 ENCOUNTER — Other Ambulatory Visit: Payer: Self-pay | Admitting: Family Medicine

## 2020-03-18 ENCOUNTER — Other Ambulatory Visit: Payer: Self-pay | Admitting: Family Medicine

## 2020-03-22 ENCOUNTER — Ambulatory Visit (INDEPENDENT_AMBULATORY_CARE_PROVIDER_SITE_OTHER): Payer: BC Managed Care – PPO | Admitting: Family Medicine

## 2020-03-22 ENCOUNTER — Encounter: Payer: Self-pay | Admitting: Family Medicine

## 2020-03-22 VITALS — BP 119/73 | HR 83 | Wt 293.0 lb

## 2020-03-22 DIAGNOSIS — F419 Anxiety disorder, unspecified: Secondary | ICD-10-CM

## 2020-03-22 DIAGNOSIS — N938 Other specified abnormal uterine and vaginal bleeding: Secondary | ICD-10-CM

## 2020-03-22 DIAGNOSIS — N926 Irregular menstruation, unspecified: Secondary | ICD-10-CM

## 2020-03-22 DIAGNOSIS — K219 Gastro-esophageal reflux disease without esophagitis: Secondary | ICD-10-CM

## 2020-03-22 DIAGNOSIS — F331 Major depressive disorder, recurrent, moderate: Secondary | ICD-10-CM

## 2020-03-22 MED ORDER — NAPROXEN 500 MG PO TABS: 500.0000 mg | ORAL_TABLET | Freq: Two times a day (BID) | ORAL | 0 refills | Status: DC

## 2020-03-22 MED ORDER — PANTOPRAZOLE SODIUM 40 MG PO TBEC
40.0000 mg | DELAYED_RELEASE_TABLET | Freq: Every day | ORAL | 3 refills | Status: DC
Start: 2020-03-22 — End: 2020-04-19

## 2020-03-22 MED ORDER — SERTRALINE HCL 50 MG PO TABS
50.0000 mg | ORAL_TABLET | Freq: Every day | ORAL | 3 refills | Status: DC
Start: 2020-03-22 — End: 2020-04-19

## 2020-03-22 MED ORDER — MEDROXYPROGESTERONE ACETATE 10 MG PO TABS: ORAL_TABLET | ORAL | 6 refills | Status: DC

## 2020-03-22 NOTE — Patient Instructions (Signed)
Great to see you! Have labs completed.  Let's try sertraline, start 1/2 tab for the first week then increase to full tab.  See me again in about 4 weeks.

## 2020-03-24 DIAGNOSIS — K219 Gastro-esophageal reflux disease without esophagitis: Secondary | ICD-10-CM | POA: Insufficient documentation

## 2020-03-24 NOTE — Assessment & Plan Note (Signed)
Related to PCOS.  Has painful periods.  She will continue pro-vera monthly as needed.  Naproxen renewed.

## 2020-03-24 NOTE — Progress Notes (Signed)
Ariana Parks - 39 y.o. female MRN [id number]  Date of birth: [birth year]  Subjective No chief complaint on file.   HPI Ariana Parks is a 39 y.o. female here today for follow up visit.  She has history of depression with anxiety, IBS with nausea, and PCOS.  She has tried several SSRI/SNRI's with varying degrees of intolerance/side effects or was non-responsive to medication.  She most recently tried Viibryd however had to discontinue due to ongoing nausea.  She does recall taking sertraline in her teens and did pretty well with this.  She does also have lorazepam as needed.   She also requests renewal of naproxen which she takes for painful menstrual cycles.  She has tolerated this well.  ROS:  A comprehensive ROS was completed and negative except as noted per HPI  Allergies  Allergen Reactions  . Celexa [Citalopram Hydrobromide]   . Ibuprofen     High doses  . Prednisone   . Prilosec [Omeprazole]   . Wellbutrin [Bupropion]   . Flexeril [Cyclobenzaprine] Other (See Comments)    Dreams    Past Medical History:  Diagnosis Date  . Abnormal Pap smear of cervix 2005   Mod dysplasia  . Depression   . GERD (gastroesophageal reflux disease)   . High cholesterol   . Nonocclusive mesenteric ischemia ([place]) 04/04/2018   Dx colonscopy Aug 2019 GAP  . OSA (obstructive sleep apnea) 07/05/2016  . Panic attack   . PCOS (polycystic ovarian syndrome)     Past Surgical History:  Procedure Laterality Date  . KNEE SURGERY    . LEEP    . TONSILLECTOMY    . WISDOM TOOTH EXTRACTION      Social History   Socioeconomic History  . Marital status: Single    Spouse name: Not on file  . Number of children: Not on file  . Years of education: Not on file  . Highest education level: Not on file  Occupational History  . Occupation: Designer, jewellery  Tobacco Use  . Smoking status: Current Every Day Smoker    Packs/day: 0.50    Years: 15.00    Pack years: 7.50    Types: Cigarettes  .  Smokeless tobacco: Never Used  . Tobacco comment: using vapes  Substance and Sexual Activity  . Alcohol use: Yes    Alcohol/week: 0.0 standard drinks  . Drug use: No  . Sexual activity: Not Currently    Partners: Male    Birth control/protection: None  Other Topics Concern  . Not on file  Social History Narrative  . Not on file   Social Determinants of Health   Financial Resource Strain:   . Difficulty of Paying Living Expenses: Not on file  Food Insecurity:   . Worried About Charity fundraiser in the Last Year: Not on file  . Ran Out of Food in the Last Year: Not on file  Transportation Needs:   . Lack of Transportation (Medical): Not on file  . Lack of Transportation (Non-Medical): Not on file  Physical Activity:   . Days of Exercise per Week: Not on file  . Minutes of Exercise per Session: Not on file  Stress:   . Feeling of Stress : Not on file  Social Connections:   . Frequency of Communication with Friends and Family: Not on file  . Frequency of Social Gatherings with Friends and Family: Not on file  . Attends Religious Services: Not on file  . Active Member of Clubs or Organizations:  Not on file  . Attends Archivist Meetings: Not on file  . Marital Status: Not on file    Family History  Problem Relation Age of Onset  . Hypertension Mother   . Thyroid disease Mother   . GER disease Father   . Migraines Father   . Heart attack Other   . Thyroid disease Brother     Health Maintenance  Topic Date Due  . Hepatitis C Screening  Never done  . PAP SMEAR-Modifier  08/09/2007  . COVID-19 Vaccine (1) 04/07/2020 (Originally 08/20/1993)  . INFLUENZA VACCINE  09/22/2020 (Originally 01/24/2020)  . COLONOSCOPY  02/07/2023  . TETANUS/TDAP  12/03/2027  . HIV Screening  Completed      ----------------------------------------------------------------------------------------------------------------------------------------------------------------------------------------------------------------- Physical Exam BP 119/73 (BP Location: Right Arm, Patient Position: Sitting, Cuff Size: Large)   Pulse 83   Wt 293 lb (132.9 kg)   SpO2 94%   BMI 42.91 kg/m   Physical Exam Constitutional:      Appearance: Normal appearance.  HENT:     Head: Normocephalic and atraumatic.  Eyes:     General: No scleral icterus. Cardiovascular:     Rate and Rhythm: Normal rate and regular rhythm.  Pulmonary:     Effort: Pulmonary effort is normal.     Breath sounds: Normal breath sounds.  Musculoskeletal:     Cervical back: Neck supple.  Neurological:     General: No focal deficit present.     Mental Status: She is alert.  Psychiatric:        Mood and Affect: Mood normal.        Behavior: Behavior normal.     ------------------------------------------------------------------------------------------------------------------------------------------------------------------------------------------------------------------- Assessment and Plan  MDD (major depressive disorder) She has discontinue Viibryd due to ongoing nausea.   Recalls doing ok with sertraline and will restart this.  Can consider addition of BuSpar or hydroxyzine at follow up if she continues to have issues with ongoing anxiety.    DUB (dysfunctional uterine bleeding) Related to PCOS.  Has painful periods.  She will continue pro-vera monthly as needed.  Naproxen renewed.    GERD (gastroesophageal reflux disease) Start protonix daily.    Meds ordered this encounter  Medications  . pantoprazole (PROTONIX) 40 MG tablet    Sig: Take 1 tablet (40 mg total) by mouth daily.    Dispense:  30 tablet    Refill:  3  . sertraline (ZOLOFT) 50 MG tablet    Sig: Take 1 tablet (50 mg total) by mouth daily. Start 25mg  daily  then increase to 50mg  daily.    Dispense:  30 tablet    Refill:  3  . medroxyPROGESTERone (PROVERA) 10 MG tablet    Sig: 1 pill daily x10 days each month for menstrual cycle.    Dispense:  10 tablet    Refill:  6  . naproxen (NAPROSYN) 500 MG tablet    Sig: Take 1 tablet (500 mg total) by mouth 2 (two) times daily with a meal.    Dispense:  30 tablet    Refill:  0    Return in about 4 weeks (around 04/19/2020) for anxiety/depression.    This visit occurred during the SARS-CoV-2 public health emergency.  Safety protocols were in place, including screening questions prior to the visit, additional usage of staff PPE, and extensive cleaning of exam room while observing appropriate contact time as indicated for disinfecting solutions.

## 2020-03-24 NOTE — Assessment & Plan Note (Signed)
Start protonix daily.

## 2020-03-24 NOTE — Assessment & Plan Note (Signed)
She has discontinue Viibryd due to ongoing nausea.   Recalls doing ok with sertraline and will restart this.  Can consider addition of BuSpar or hydroxyzine at follow up if she continues to have issues with ongoing anxiety.

## 2020-04-19 ENCOUNTER — Ambulatory Visit (INDEPENDENT_AMBULATORY_CARE_PROVIDER_SITE_OTHER): Payer: BC Managed Care – PPO | Admitting: Family Medicine

## 2020-04-19 ENCOUNTER — Other Ambulatory Visit: Payer: Self-pay

## 2020-04-19 ENCOUNTER — Encounter: Payer: Self-pay | Admitting: Family Medicine

## 2020-04-19 VITALS — BP 126/60 | HR 90 | Wt 289.1 lb

## 2020-04-19 DIAGNOSIS — N912 Amenorrhea, unspecified: Secondary | ICD-10-CM | POA: Diagnosis not present

## 2020-04-19 DIAGNOSIS — N938 Other specified abnormal uterine and vaginal bleeding: Secondary | ICD-10-CM | POA: Diagnosis not present

## 2020-04-19 DIAGNOSIS — F419 Anxiety disorder, unspecified: Secondary | ICD-10-CM

## 2020-04-19 DIAGNOSIS — K219 Gastro-esophageal reflux disease without esophagitis: Secondary | ICD-10-CM

## 2020-04-19 DIAGNOSIS — K0889 Other specified disorders of teeth and supporting structures: Secondary | ICD-10-CM | POA: Insufficient documentation

## 2020-04-19 MED ORDER — HYDROCODONE-IBUPROFEN 5-200 MG PO TABS: 1.0000 | ORAL_TABLET | Freq: Three times a day (TID) | ORAL | 0 refills | Status: DC | PRN

## 2020-04-19 MED ORDER — LANSOPRAZOLE 30 MG PO CPDR: 30.0000 mg | DELAYED_RELEASE_CAPSULE | Freq: Every day | ORAL | 1 refills | Status: DC

## 2020-04-19 MED ORDER — SERTRALINE HCL 100 MG PO TABS
100.0000 mg | ORAL_TABLET | Freq: Every day | ORAL | 3 refills | Status: DC
Start: 2020-04-19 — End: 2020-06-16

## 2020-04-19 NOTE — Assessment & Plan Note (Signed)
She is not tolerating protonix well.  Will try lansoprazole and see if this is better tolerated.

## 2020-04-19 NOTE — Patient Instructions (Signed)
Great to see you! Try one more round of the progesterone.  Have labs completed.  Increase sertraline to 100mg  daily.  Try lansoprazole in place of protonix.

## 2020-04-19 NOTE — Assessment & Plan Note (Signed)
She is doing well with sertraline, titrate to 100mg  daily

## 2020-04-19 NOTE — Progress Notes (Signed)
Ariana Parks - 39 y.o. female MRN [id number]  Date of birth: 05-Mar-1981  Subjective Chief Complaint  Patient presents with  . Anxiety  . Depression    HPI Ariana Parks is a 39 y.o. female here today for follow up of anxiety.  She reports that she is doing well with sertraline but feels that dose could be increased.  She is tolerating this quite well without nausea.    She does feel like she is having some issues with protonix.  She has had nausea after taking this, similar to what she had with omeprazole.  She would like to try something different.     She also has several teeth that are bothering her, plans to have dental work to fix issue.  Denies any gum swelling or drainage.    She still has not had menstrual period.  Has taken progesterone cycle for 2 consecutive months.  Denies sexua activity or chance of pregnancy.  She has some mild cramping last month without bleeding.  She has not had labs completed Allergies  Allergen Reactions  . Celexa [Citalopram Hydrobromide]   . Ibuprofen     High doses  . Prednisone   . Prilosec [Omeprazole]   . Wellbutrin [Bupropion]   . Flexeril [Cyclobenzaprine] Other (See Comments)    Dreams    Past Medical History:  Diagnosis Date  . Abnormal Pap smear of cervix 2005   Mod dysplasia  . Depression   . GERD (gastroesophageal reflux disease)   . High cholesterol   . Nonocclusive mesenteric ischemia ([place]) 04/04/2018   Dx colonscopy Aug 2019 [facility]  . OSA (obstructive sleep apnea) 07/05/2016  . Panic attack   . PCOS (polycystic ovarian syndrome)     Past Surgical History:  Procedure Laterality Date  . KNEE SURGERY    . LEEP    . TONSILLECTOMY    . WISDOM TOOTH EXTRACTION      Social History   Socioeconomic History  . Marital status: Single    Spouse name: Not on file  . Number of children: Not on file  . Years of education: Not on file  . Highest education level: Not on file  Occupational History  . Occupation: Primary school teacher  Tobacco Use  . Smoking status: Current Every Day Smoker    Packs/day: 0.50    Years: 15.00    Pack years: 7.50    Types: Cigarettes  . Smokeless tobacco: Never Used  . Tobacco comment: using vapes  Substance and Sexual Activity  . Alcohol use: Yes    Alcohol/week: 0.0 standard drinks  . Drug use: No  . Sexual activity: Not Currently    Partners: Male    Birth control/protection: None  Other Topics Concern  . Not on file  Social History Narrative  . Not on file   Social Determinants of Health   Financial Resource Strain:   . Difficulty of Paying Living Expenses: Not on file  Food Insecurity:   . Worried About Charity fundraiser in the Last Year: Not on file  . Ran Out of Food in the Last Year: Not on file  Transportation Needs:   . Lack of Transportation (Medical): Not on file  . Lack of Transportation (Non-Medical): Not on file  Physical Activity:   . Days of Exercise per Week: Not on file  . Minutes of Exercise per Session: Not on file  Stress:   . Feeling of Stress : Not on file  Social Connections:   .  Frequency of Communication with Friends and Family: Not on file  . Frequency of Social Gatherings with Friends and Family: Not on file  . Attends Religious Services: Not on file  . Active Member of Clubs or Organizations: Not on file  . Attends Archivist Meetings: Not on file  . Marital Status: Not on file    Family History  Problem Relation Age of Onset  . Hypertension Mother   . Thyroid disease Mother   . GER disease Father   . Migraines Father   . Heart attack Other   . Thyroid disease Brother     Health Maintenance  Topic Date Due  . Hepatitis C Screening  Never done  . COVID-19 Vaccine (1) Never done  . PAP SMEAR-Modifier  08/09/2007  . INFLUENZA VACCINE  09/22/2020 (Originally 01/24/2020)  . COLONOSCOPY  02/07/2023  . TETANUS/TDAP  12/03/2027  . HIV Screening  Completed      ----------------------------------------------------------------------------------------------------------------------------------------------------------------------------------------------------------------- Physical Exam BP 126/60 (BP Location: Left Arm, Patient Position: Sitting, Cuff Size: Large)   Pulse 90   Wt 289 lb 1.6 oz (131.1 kg)   SpO2 97%   BMI 42.33 kg/m   Physical Exam Constitutional:      Appearance: Normal appearance.  Cardiovascular:     Rate and Rhythm: Normal rate and regular rhythm.  Pulmonary:     Effort: Pulmonary effort is normal.     Breath sounds: Normal breath sounds.  Lymphadenopathy:     Cervical: No cervical adenopathy.  Neurological:     General: No focal deficit present.     Mental Status: She is alert.  Psychiatric:        Mood and Affect: Mood normal.        Behavior: Behavior normal.     ------------------------------------------------------------------------------------------------------------------------------------------------------------------------------------------------------------------- Assessment and Plan  DUB (dysfunctional uterine bleeding) DUB now with amenorrhea.  Will add UPT to upcoming labs.   She will repeat progesterone and if no period after this we discussed referral to GYN  Anxiety She is doing well with sertraline, titrate to 100mg  daily   GERD (gastroesophageal reflux disease) She is not tolerating protonix well.  Will try lansoprazole and see if this is better tolerated.    Pain, dental Rx for short term vicoprofen as needed.  She plans to have dental work soon.    Meds ordered this encounter  Medications  . sertraline (ZOLOFT) 100 MG tablet    Sig: Take 1 tablet (100 mg total) by mouth daily.    Dispense:  30 tablet    Refill:  3  . lansoprazole (PREVACID) 30 MG capsule    Sig: Take 1 capsule (30 mg total) by mouth daily at 12 noon.    Dispense:  90 capsule    Refill:  1  .  hydrocodone-ibuprofen (VICOPROFEN) 5-200 MG tablet    Sig: Take 1 tablet by mouth every 8 (eight) hours as needed for pain (Only for severe dental pain).    Dispense:  10 tablet    Refill:  0    Return in about 2 months (around 06/19/2020) for anxiety.    This visit occurred during the SARS-CoV-2 public health emergency.  Safety protocols were in place, including screening questions prior to the visit, additional usage of staff PPE, and extensive cleaning of exam room while observing appropriate contact time as indicated for disinfecting solutions.

## 2020-04-19 NOTE — Assessment & Plan Note (Signed)
Rx for short term vicoprofen as needed.  She plans to have dental work soon.

## 2020-04-19 NOTE — Assessment & Plan Note (Signed)
DUB now with amenorrhea.  Will add UPT to upcoming labs.   She will repeat progesterone and if no period after this we discussed referral to GYN

## 2020-04-22 DIAGNOSIS — N912 Amenorrhea, unspecified: Secondary | ICD-10-CM | POA: Diagnosis not present

## 2020-04-23 LAB — COMPLETE METABOLIC PANEL WITH GFR
AG Ratio: 1.7 (calc) (ref 1.0–2.5)
ALT: 8 U/L (ref 6–29)
AST: 10 U/L (ref 10–30)
Albumin: 3.5 g/dL — ABNORMAL LOW (ref 3.6–5.1)
Alkaline phosphatase (APISO): 61 U/L (ref 31–125)
BUN/Creatinine Ratio: 23 (calc) — ABNORMAL HIGH (ref 6–22)
BUN: 9 mg/dL (ref 7–25)
CO2: 22 mmol/L (ref 20–32)
Calcium: 8.8 mg/dL (ref 8.6–10.2)
Chloride: 108 mmol/L (ref 98–110)
Creat: 0.4 mg/dL — ABNORMAL LOW (ref 0.50–1.10)
GFR, Est African American: 153 mL/min/{1.73_m2} (ref >=60)
GFR, Est Non African American: 132 mL/min/{1.73_m2} (ref >=60)
Globulin: 2.1 g/dL (calc) (ref 1.9–3.7)
Glucose, Bld: 88 mg/dL (ref 65–99)
Potassium: 4.1 mmol/L (ref 3.5–5.3)
Sodium: 138 mmol/L (ref 135–146)
Total Bilirubin: 0.2 mg/dL (ref 0.2–1.2)
Total Protein: 5.6 g/dL — ABNORMAL LOW (ref 6.1–8.1)

## 2020-04-23 LAB — HEMOGLOBIN A1C
Hgb A1c MFr Bld: 5.3 % of total Hgb (ref <5.7)
Mean Plasma Glucose: 105 (calc)
eAG (mmol/L): 5.8 (calc)

## 2020-04-23 LAB — LIPID PANEL
Cholesterol: 145 mg/dL (ref <200)
HDL: 55 mg/dL (ref >=50)
LDL Cholesterol (Calc): 65 mg/dL (calc)
Non-HDL Cholesterol (Calc): 90 mg/dL (calc) (ref <130)
Total CHOL/HDL Ratio: 2.6 (calc) (ref <5.0)
Triglycerides: 186 mg/dL — ABNORMAL HIGH (ref <150)

## 2020-04-23 LAB — TSH: TSH: 1.96 mIU/L

## 2020-04-23 LAB — CBC
HCT: 38.3 % (ref 35.0–45.0)
Hemoglobin: 12.9 g/dL (ref 11.7–15.5)
MCH: 31.8 pg (ref 27.0–33.0)
MCHC: 33.7 g/dL (ref 32.0–36.0)
MCV: 94.3 fL (ref 80.0–100.0)
MPV: 11.2 fL (ref 7.5–12.5)
Platelets: 247 10*3/uL (ref 140–400)
RBC: 4.06 10*6/uL (ref 3.80–5.10)
RDW: 12.1 % (ref 11.0–15.0)
WBC: 15.3 10*3/uL — ABNORMAL HIGH (ref 3.8–10.8)

## 2020-04-23 LAB — URINALYSIS, ROUTINE W REFLEX MICROSCOPIC
Bilirubin Urine: NEGATIVE
Glucose, UA: NEGATIVE
Hgb urine dipstick: NEGATIVE
Ketones, ur: NEGATIVE
Leukocytes,Ua: NEGATIVE
Nitrite: NEGATIVE
Protein, ur: NEGATIVE
Specific Gravity, Urine: 1.015 (ref 1.001–1.03)
pH: 7.5 (ref 5.0–8.0)

## 2020-04-23 LAB — PREGNANCY, URINE: Preg Test, Ur: POSITIVE — AB

## 2020-04-24 ENCOUNTER — Encounter: Payer: Self-pay | Admitting: Family Medicine

## 2020-04-24 DIAGNOSIS — Z3490 Encounter for supervision of normal pregnancy, unspecified, unspecified trimester: Secondary | ICD-10-CM

## 2020-04-26 DIAGNOSIS — N911 Secondary amenorrhea: Secondary | ICD-10-CM | POA: Diagnosis not present

## 2020-04-28 ENCOUNTER — Telehealth: Payer: Self-pay

## 2020-04-28 NOTE — Telephone Encounter (Signed)
Ariana Parks called stating she noticed elevated WBC in her labs. Does this require treatment or concern?  She has confirmed 6 months pregnancy.

## 2020-04-29 NOTE — Telephone Encounter (Signed)
This may be due to pregnancy or the dental issues she has had.  We can recheck cbc w/ diff in 3-4 weeks and see if this is remaining stable.

## 2020-04-29 NOTE — Telephone Encounter (Signed)
LVM advising patient of recommendations. Direct callback number provided for further clarification or questions.

## 2020-05-03 DIAGNOSIS — Z23 Encounter for immunization: Secondary | ICD-10-CM | POA: Diagnosis not present

## 2020-05-03 DIAGNOSIS — Z3685 Encounter for antenatal screening for Streptococcus B: Secondary | ICD-10-CM | POA: Diagnosis not present

## 2020-05-03 DIAGNOSIS — Z3481 Encounter for supervision of other normal pregnancy, first trimester: Secondary | ICD-10-CM | POA: Diagnosis not present

## 2020-05-03 DIAGNOSIS — Z3400 Encounter for supervision of normal first pregnancy, unspecified trimester: Secondary | ICD-10-CM | POA: Diagnosis not present

## 2020-05-03 LAB — HEPATITIS C ANTIBODY: HCV Ab: NEGATIVE

## 2020-05-03 LAB — OB RESULTS CONSOLE RUBELLA ANTIBODY, IGM: Rubella: IMMUNE

## 2020-05-03 LAB — OB RESULTS CONSOLE HEPATITIS B SURFACE ANTIGEN: Hepatitis B Surface Ag: NEGATIVE

## 2020-05-03 LAB — OB RESULTS CONSOLE HIV ANTIBODY (ROUTINE TESTING): HIV: NONREACTIVE

## 2020-05-09 DIAGNOSIS — Z3403 Encounter for supervision of normal first pregnancy, third trimester: Secondary | ICD-10-CM | POA: Diagnosis not present

## 2020-05-09 DIAGNOSIS — Z363 Encounter for antenatal screening for malformations: Secondary | ICD-10-CM | POA: Diagnosis not present

## 2020-05-09 DIAGNOSIS — Z3A28 28 weeks gestation of pregnancy: Secondary | ICD-10-CM | POA: Diagnosis not present

## 2020-05-13 DIAGNOSIS — Z3A28 28 weeks gestation of pregnancy: Secondary | ICD-10-CM | POA: Diagnosis not present

## 2020-05-13 DIAGNOSIS — O99810 Abnormal glucose complicating pregnancy: Secondary | ICD-10-CM | POA: Diagnosis not present

## 2020-05-18 ENCOUNTER — Other Ambulatory Visit: Payer: Self-pay

## 2020-05-18 ENCOUNTER — Encounter: Payer: BC Managed Care – PPO | Attending: Obstetrics and Gynecology | Admitting: Registered"

## 2020-05-18 DIAGNOSIS — O24419 Gestational diabetes mellitus in pregnancy, unspecified control: Secondary | ICD-10-CM | POA: Diagnosis not present

## 2020-05-23 ENCOUNTER — Encounter: Payer: Self-pay | Admitting: Registered"

## 2020-05-23 DIAGNOSIS — O24419 Gestational diabetes mellitus in pregnancy, unspecified control: Secondary | ICD-10-CM | POA: Insufficient documentation

## 2020-05-23 NOTE — Progress Notes (Signed)
Patient was seen on 05/18/20 for Gestational Diabetes self-management class at the [facility]. The following learning objectives were met by the patient during this course:   States the definition of Gestational Diabetes  States why dietary management is important in controlling blood glucose  Describes the effects each nutrient has on blood glucose levels  Demonstrates ability to create a balanced meal plan  Demonstrates carbohydrate counting   States when to check blood glucose levels  Demonstrates proper blood glucose monitoring techniques  States the effect of stress and exercise on blood glucose levels  States the importance of limiting caffeine and abstaining from alcohol and smoking  Blood glucose monitor given: OneTouch Verio Flex Lot# [id number] X Exp: 07/25/2020 Blood Glucose: 91 mg/dL  Patient instructed to monitor glucose levels: FBS: 60 - <95; 1 hour: <140; 2 hour: <120  Patient received handouts:  Nutrition Diabetes and Pregnancy, including carb counting list  Patient will be seen for follow-up as needed.

## 2020-05-24 DIAGNOSIS — Z3A30 30 weeks gestation of pregnancy: Secondary | ICD-10-CM | POA: Diagnosis not present

## 2020-05-24 DIAGNOSIS — Z362 Encounter for other antenatal screening follow-up: Secondary | ICD-10-CM | POA: Diagnosis not present

## 2020-06-01 DIAGNOSIS — G4733 Obstructive sleep apnea (adult) (pediatric): Secondary | ICD-10-CM | POA: Diagnosis not present

## 2020-06-01 DIAGNOSIS — Z6841 Body Mass Index (BMI) 40.0 and over, adult: Secondary | ICD-10-CM | POA: Diagnosis not present

## 2020-06-07 DIAGNOSIS — O99891 Other specified diseases and conditions complicating pregnancy: Secondary | ICD-10-CM | POA: Diagnosis not present

## 2020-06-07 DIAGNOSIS — Z3A32 32 weeks gestation of pregnancy: Secondary | ICD-10-CM | POA: Diagnosis not present

## 2020-06-10 DIAGNOSIS — Z3A32 32 weeks gestation of pregnancy: Secondary | ICD-10-CM | POA: Diagnosis not present

## 2020-06-10 DIAGNOSIS — O24419 Gestational diabetes mellitus in pregnancy, unspecified control: Secondary | ICD-10-CM | POA: Diagnosis not present

## 2020-06-12 ENCOUNTER — Other Ambulatory Visit: Payer: Self-pay | Admitting: Oncology

## 2020-06-12 ENCOUNTER — Telehealth: Payer: Self-pay | Admitting: Nurse Practitioner

## 2020-06-12 DIAGNOSIS — U071 COVID-19: Secondary | ICD-10-CM

## 2020-06-12 NOTE — Telephone Encounter (Signed)
Called to Discuss with patient about Covid symptoms and the use of the monoclonal antibody infusion for those with mild to moderate Covid symptoms and at a high risk of hospitalization.     Pt appears to qualify for this infusion due to co-morbid conditions and/or a member of an at-risk group in accordance with the FDA Emergency Use Authorization. (BMI>30, pregnancy, MDD)    Unable to reach pt. Voicemail left and My Chart message sent.   Alda Lea, NP [facility]  [phone]

## 2020-06-12 NOTE — Progress Notes (Signed)
I connected by phone with Mrs. Gierke to discuss the potential use of an new treatment for mild to moderate COVID-19 viral infection in non-hospitalized patients.   This patient is a age/sex that meets the FDA criteria for Emergency Use Authorization of casirivimab\imdevimab.  Has a (+) direct SARS-CoV-2 viral test result 1. Has mild or moderate COVID-19  2. Is ? 38 years of age and weighs ? 40 kg 3. Is NOT hospitalized due to COVID-19 4. Is NOT requiring oxygen therapy or requiring an increase in baseline oxygen flow rate due to COVID-19 5. Is within 10 days of symptom onset 6. Has at least one of the high risk factor(s) for progression to severe COVID-19 and/or hospitalization as defined in EUA. Specific high risk criteria : Past Medical History:  Diagnosis Date  . Abnormal Pap smear of cervix 2005   Mod dysplasia  . Depression   . GERD (gastroesophageal reflux disease)   . High cholesterol   . Nonocclusive mesenteric ischemia ([place]) 04/04/2018   Dx colonscopy Aug 2019 [facility]AP  . OSA (obstructive sleep apnea) 07/05/2016  . Panic attack   . PCOS (polycystic ovarian syndrome)   ?  ?    Symptom onset  06/07/20   I have spoken and communicated the following to the patient or parent/caregiver:   1. FDA has authorized the emergency use of casirivimab\imdevimab for the treatment of mild to moderate COVID-19 in adults and pediatric patients with positive results of direct SARS-CoV-2 viral testing who are 69 years of age and older weighing at least 40 kg, and who are at high risk for progressing to severe COVID-19 and/or hospitalization.   2. The significant known and potential risks and benefits of casirivimab\imdevimab, and the extent to which such potential risks and benefits are unknown.   3. Information on available alternative treatments and the risks and benefits of those alternatives, including clinical trials.   4. Patients treated with casirivimab\imdevimab should continue to  self-isolate and use infection control measures (e.g., wear mask, isolate, social distance, avoid sharing personal items, clean and disinfect "high touch" surfaces, and frequent handwashing) according to CDC guidelines.    5. The patient or parent/caregiver has the option to accept or refuse casirivimab\imdevimab .   After reviewing this information with the patient, The patient agreed to proceed with receiving casirivimab\imdevimab infusion and will be provided a copy of the Fact sheet prior to receiving the infusion.Rulon Abide, AGNP-C [phone] ([place])

## 2020-06-13 ENCOUNTER — Ambulatory Visit (HOSPITAL_COMMUNITY)
Admission: RE | Admit: 2020-06-13 | Discharge: 2020-06-13 | Disposition: A | Discharge status: Home / Self Care | Payer: BC Managed Care – PPO | Source: Ambulatory Visit | Attending: Pulmonary Disease | Admitting: Pulmonary Disease

## 2020-06-13 DIAGNOSIS — U071 COVID-19: Secondary | ICD-10-CM | POA: Insufficient documentation

## 2020-06-13 MED ORDER — METHYLPREDNISOLONE SODIUM SUCC 125 MG IJ SOLR: 125.0000 mg | Freq: Once | INTRAMUSCULAR | Status: DC | PRN

## 2020-06-13 MED ORDER — DIPHENHYDRAMINE HCL 50 MG/ML IJ SOLN: 50.0000 mg | Freq: Once | INTRAMUSCULAR | Status: DC | PRN

## 2020-06-13 MED ORDER — SODIUM CHLORIDE 0.9 % IV SOLN: INTRAVENOUS | Status: DC | PRN

## 2020-06-13 MED ORDER — FAMOTIDINE IN NACL 20-0.9 MG/50ML-% IV SOLN: 20.0000 mg | Freq: Once | INTRAVENOUS | Status: DC | PRN

## 2020-06-13 MED ORDER — SODIUM CHLORIDE 0.9 % IV SOLN: Freq: Once | INTRAVENOUS | Status: AC

## 2020-06-13 MED ORDER — ALBUTEROL SULFATE HFA 108 (90 BASE) MCG/ACT IN AERS: 2.0000 | INHALATION_SPRAY | Freq: Once | RESPIRATORY_TRACT | Status: DC | PRN

## 2020-06-13 MED ORDER — EPINEPHRINE 0.3 MG/0.3ML IJ SOAJ: 0.3000 mg | Freq: Once | INTRAMUSCULAR | Status: DC | PRN

## 2020-06-13 NOTE — Discharge Instructions (Signed)
10 Things You Can Do to Manage Your COVID-19 Symptoms at Home If you have possible or confirmed COVID-19: 1. Stay home from work and school. And stay away from other public places. If you must go out, avoid using any kind of public transportation, ridesharing, or taxis. 2. Monitor your symptoms carefully. If your symptoms get worse, call your healthcare provider immediately. 3. Get rest and stay hydrated. 4. If you have a medical appointment, call the healthcare provider ahead of time and tell them that you have or may have COVID-19. 5. For medical emergencies, call 911 and notify the dispatch personnel that you have or may have COVID-19. 6. Cover your cough and sneezes with a tissue or use the inside of your elbow. 7. Wash your hands often with soap and water for at least 20 seconds or clean your hands with an alcohol-based hand sanitizer that contains at least 60% alcohol. 8. As much as possible, stay in a specific room and away from other people in your home. Also, you should use a separate bathroom, if available. If you need to be around other people in or outside of the home, wear a mask. 9. Avoid sharing personal items with other people in your household, like dishes, towels, and bedding. 10. Clean all surfaces that are touched often, like counters, tabletops, and doorknobs. Use household cleaning sprays or wipes according to the label instructions. cdc.gov/coronavirus 12/24/2018 This information is not intended to replace advice given to you by your health care provider. Make sure you discuss any questions you have with your health care provider. Document Revised: 05/28/2019 Document Reviewed: 05/28/2019 Elsevier Patient Education  2020 Elsevier Inc. What types of side effects do monoclonal antibody drugs cause?  Common side effects  In general, the more common side effects caused by monoclonal antibody drugs include: . Allergic reactions, such as hives or itching . Flu-like signs and  symptoms, including chills, fatigue, fever, and muscle aches and pains . Nausea, vomiting . Diarrhea . Skin rashes . Low blood pressure   The CDC is recommending patients who receive monoclonal antibody treatments wait at least 90 days before being vaccinated.  Currently, there are no data on the safety and efficacy of mRNA COVID-19 vaccines in persons who received monoclonal antibodies or convalescent plasma as part of COVID-19 treatment. Based on the estimated half-life of such therapies as well as evidence suggesting that reinfection is uncommon in the 90 days after initial infection, vaccination should be deferred for at least 90 days, as a precautionary measure until additional information becomes available, to avoid interference of the antibody treatment with vaccine-induced immune responses. If you have any questions or concerns after the infusion please call the Advanced Practice Provider on call at [phone]. This number is ONLY intended for your use regarding questions or concerns about the infusion post-treatment side-effects.  Please do not provide this number to others for use. For return to work notes please contact your primary care provider.   If someone you know is interested in receiving treatment please have them call the COVID hotline at [phone].   

## 2020-06-13 NOTE — Progress Notes (Signed)
  Diagnosis: COVID-19  Physician: Dr. Joya Gaskins   Procedure: [facility] Med: bamlanivimab\etesevimab infusion - Provided patient with bamlanimivab\etesevimab fact sheet for patients, parents and caregivers prior to infusion.  Complications: No immediate complications noted.  Discharge: Discharged home   Hulan Fess 06/13/2020

## 2020-06-13 NOTE — Progress Notes (Signed)
Patient reviewed Fact Sheet for Patients, Parents, and Caregivers for Emergency Use Authorization (EUA) of bamlanivimab and etesevimab for the Treatment of Coronavirus. Patient also reviewed and is agreeable to the estimated cost of treatment. Patient is agreeable to proceed.   

## 2020-06-15 ENCOUNTER — Other Ambulatory Visit: Payer: Self-pay | Admitting: Family Medicine

## 2020-06-16 ENCOUNTER — Encounter (HOSPITAL_COMMUNITY): Payer: Self-pay | Admitting: *Deleted

## 2020-06-16 ENCOUNTER — Inpatient Hospital Stay (HOSPITAL_COMMUNITY)
Admission: AD | Admit: 2020-06-16 | Discharge: 2020-06-16 | Disposition: A | Discharge status: Home / Self Care | Payer: BC Managed Care – PPO | Attending: Obstetrics and Gynecology | Admitting: Obstetrics and Gynecology

## 2020-06-16 DIAGNOSIS — Z3689 Encounter for other specified antenatal screening: Secondary | ICD-10-CM

## 2020-06-16 DIAGNOSIS — Z3A33 33 weeks gestation of pregnancy: Secondary | ICD-10-CM | POA: Diagnosis not present

## 2020-06-16 DIAGNOSIS — U071 COVID-19: Secondary | ICD-10-CM | POA: Insufficient documentation

## 2020-06-16 NOTE — Discharge Instructions (Signed)
Fetal Movement Counts Patient Name: ________________________________________________ Patient Due Date: ____________________ What is a fetal movement count?  A fetal movement count is the number of times that you feel your baby move during a certain amount of time. This may also be called a fetal kick count. A fetal movement count is recommended for every pregnant woman. You may be asked to start counting fetal movements as early as week 28 of your pregnancy. Pay attention to when your baby is most active. You may notice your baby's sleep and wake cycles. You may also notice things that make your baby move more. You should do a fetal movement count:  When your baby is normally most active.  At the same time each day. A good time to count movements is while you are resting, after having something to eat and drink. How do I count fetal movements? 1. Find a quiet, comfortable area. Sit, or lie down on your side. 2. Write down the date, the start time and stop time, and the number of movements that you felt between those two times. Take this information with you to your health care visits. 3. Write down your start time when you feel the first movement. 4. Count kicks, flutters, swishes, rolls, and jabs. You should feel at least 10 movements. 5. You may stop counting after you have felt 10 movements, or if you have been counting for 2 hours. Write down the stop time. 6. If you do not feel 10 movements in 2 hours, contact your health care provider for further instructions. Your health care provider may want to do additional tests to assess your baby's well-being. Contact a health care provider if:  You feel fewer than 10 movements in 2 hours.  Your baby is not moving like he or she usually does. Date: ____________ Start time: ____________ Stop time: ____________ Movements: ____________ Date: ____________ Start time: ____________ Stop time: ____________ Movements: ____________ Date: ____________  Start time: ____________ Stop time: ____________ Movements: ____________ Date: ____________ Start time: ____________ Stop time: ____________ Movements: ____________ Date: ____________ Start time: ____________ Stop time: ____________ Movements: ____________ Date: ____________ Start time: ____________ Stop time: ____________ Movements: ____________ Date: ____________ Start time: ____________ Stop time: ____________ Movements: ____________ Date: ____________ Start time: ____________ Stop time: ____________ Movements: ____________ Date: ____________ Start time: ____________ Stop time: ____________ Movements: ____________ This information is not intended to replace advice given to you by your health care provider. Make sure you discuss any questions you have with your health care provider. Document Revised: 01/29/2019 Document Reviewed: 01/29/2019 Elsevier Patient Education  [place].   Pregnancy and COVID-19 Coronavirus disease, also called COVID-19, is an infection of the lungs and airways (respiratory tract). It is unclear at this time if pregnancy makes it more likely for you to get COVID-19, or what effects the infection may have on your unborn baby. However, pregnancy causes changes to your heart, lungs, and your body's disease-fighting system (immune system). Some of these changes make it more likely for you to get sick and have more serious illness. Therefore, it is important for you to take precautions in order to protect yourself and your unborn baby. There have been studies showing that obesity and diabetes may put you at higher risk for serious illness. If you are pregnant and are obese or have diabetes, you should take extra precautions to protect yourself from the virus. Work with your health care team to develop a plan to protect yourself from all infections, including COVID-19. This is  one way for you to stay healthy during your pregnancy and to keep your baby healthy as  well. How does this affect me? If you get COVID-19, there is a risk that you may:  Get a respiratory illness that can lead to pneumonia.  Give birth to your baby before 37 weeks of pregnancy (premature birth). If you have or may have COVID-19, your health care provider may recommend special precautions around your pregnancy. This may affect how you:  Receive care before delivery (prenatal care). How you visit your health care provider may change. Tests and scans may need to be performed differently.  Receive care during labor and delivery. This may affect your birth plan, including who may be with you during labor and delivery.  Receive care after you deliver your baby (postpartum care). You may stay longer in the hospital and in a special room.  Feed your baby after he or she is born. Pregnancy can be an especially stressful time because of the changes in your body and the preparation involved in becoming a parent. In addition, you may be feeling especially fearful, anxious, or stressed because of COVID-19 and how it is affecting you. How does this affect my baby? It is not known whether a mother will transmit the virus to her unborn baby. There is a risk that if you get COVID-19:  The virus that causes COVID-19 can pass to your baby.  You may have premature birth. Your baby may require more medical care if this happens. What can I do to lower my risk?  There is no vaccine to help prevent COVID-19. However, there are actions that you can take to protect yourself and others from this virus. Cleaning and personal hygiene  Wash your hands often with soap and water for at least 20 seconds. If soap and water are not available, use alcohol-based hand sanitizer.  Avoid touching your mouth, face, eyes, or nose.  Clean and disinfect objects and surfaces that are frequently touched every day. These may include: ? Counters and tables. ? Doorknobs and light switches. ? Sinks and  faucets. ? Electronics such as phones, remote controls, keyboards, computers, and tablets. Stay away from others  Stay away from people who are sick, if possible.  Avoid social gatherings and travel.  Stay home as much as possible. Follow these instructions: Breastfeeding It is not known if the virus that causes COVID-19 can pass through breast milk to your baby. You should make a plan for feeding your infant with your family and your health care team. If you have or may have COVID-19, your health care provider may recommend that you take precautions while breastfeeding, such as:  Washing your hands before feeding your baby.  Wearing a mask while feeding your baby.  Pumping or expressing breast milk to feed to your baby. If possible, ask someone in your household who is not sick to feed your baby the expressed breast milk. ? Wash your hands before touching pump parts. ? Wash and disinfect all pump parts after expressing milk. Follow the manufacturer's instructions to clean and disinfect all pump parts. General instructions  If you think you have a COVID-19 infection, contact your health care provider right away. Tell your health care provider that you think you may have a COVID-19 infection.  Follow your health care provider's instructions on taking medicines. Some medicines may be unsafe to take during pregnancy.  Cover your mouth and nose by wearing a mask or other cloth covering over  your face when you go out in public.  Find ways to manage stress. These may include: ? Using relaxation techniques like meditation and deep breathing. ? Getting regular exercise. Most women can continue their usual exercise routine during pregnancy. Ask your health care provider what activities are safe for you. ? Seeking support from family, friends, or spiritual resources. If you cannot be together in person, you can still connect by phone calls, texts, video calls, or online messaging. ? Spending  time doing relaxing activities that you enjoy, like listening to music or reading a good book.  Ask for help if you have counseling or nutritional needs during pregnancy. Your health care provider can offer advice or refer you to resources or specialists who can help you with various needs.  Keep all follow-up visits as told by your health care provider. This is important. Where to find more information Centers for Disease Control and Prevention (CDC): http://gutierrez-robinson.com/ World Health Organization [facility]): https://www.morales.com/ [employer]necologists (ACOG): StickerEmporium.tn Questions to ask your health care team  What should I do if I have COVID-19 symptoms?  How will COVID-19 affect my prenatal care visits, tests and scans, labor and delivery, and postpartum care?  Should I plan to breastfeed my baby?  Where can I find mental health resources?  Where can I find support if I have financial concerns? Contact a health care provider if:  You have signs and symptoms of infection, including a fever or cough. Tell your health care team that you think you may have a COVID-19 infection.  You have strong emotions, such as sadness or anxiety.  You feel unsafe in your home and need help finding a safe place to live.  You have bloody or watery vaginal discharge or vaginal bleeding. Get help right away if:  You have signs or symptoms of labor before 37 weeks of pregnancy. These include: ? Contractions that are 5 minutes or less apart, or that increase in frequency, intensity, or length. ? Sudden, sharp pain in the abdomen or in the lower back. ? A gush or trickle of fluid from your vagina.  You have signs of more serious illness such as: ? You have difficulty breathing. ? You have chest pain. ? You have a fever  greater than 102F (39C) or higher that does not go away. ? You cannot drink fluids without vomiting. ? You feel extremely weak or you faint. These symptoms may represent a serious problem that is an emergency. Do not wait to see if the symptoms will go away. Get medical help right away. Call your local emergency services (911 in the [place] Do not drive yourself to the hospital. Summary  Coronavirus disease, also called COVID-19, is an infection of the lungs and airways (respiratory tract). It is unclear at this time if pregnancy makes you more susceptible to COVID-19 and what effects it may have on unborn babies.  It is important to take precautions to protect yourself and your developing baby. This includes washing your hands often, avoiding touching your mouth, face, eyes, or nose, avoiding social gatherings and travel, and staying away from people who are sick.  If you think you have a COVID-19 infection, contact your health care provider right away. Tell your health care provider that you think you may have a COVID-19 infection.  If you have or may have COVID-19, your health care provider may recommend special precautions during your pregnancy, labor and delivery, and after your baby is born. This  information is not intended to replace advice given to you by your health care provider. Make sure you discuss any questions you have with your health care provider. Document Revised: 04/03/2019 Document Reviewed: 10/07/2018 Elsevier Patient Education  [place].

## 2020-06-16 NOTE — MAU Note (Signed)
.   Ariana Parks is a 39 y.o. at [redacted]w[redacted]d here in MAU reporting: she was sent here for NST due to having a positive covid test Pt is having bi-weekly NST for GDM  Pain score: 0 Vitals:   06/16/20 1500 06/16/20 1501  BP:    Pulse:    Resp:    Temp:  97.9 F (36.6 C)  SpO2: 100%      FHT 135 Lab orders placed from triage:

## 2020-06-16 NOTE — MAU Provider Note (Signed)
S: Ms. Ariana Parks is a 39 y.o. G1P0000 at [redacted]w[redacted]d  who presents to MAU today for NST. Reports good FM. Recently dx with Covid.      O:  BP 139/76 (BP Location: Left Arm)   Pulse (S) 88   Temp 97.9 F (36.6 C)   Resp 18   Ht 5\' 9"  (1.753 m)   Wt 131 kg   SpO2 100%   BMI 42.65 kg/m   Fetal Monitoring: Baseline: 135 Variability: mod Accelerations: + Decelerations: no Contractions: none  MDM NST reactive. Stable for discharge home.   A: [facility] at [redacted]w[redacted]d  Reactive NST  P: Discharge home Labor precautions and kick counts included in AVS Patient to follow-up with [facility] in 4 days  Patient may return to MAU as needed   Julianne Handler, [place] 06/16/2020 4:27 PM

## 2020-06-20 DIAGNOSIS — O24419 Gestational diabetes mellitus in pregnancy, unspecified control: Secondary | ICD-10-CM | POA: Diagnosis not present

## 2020-06-20 DIAGNOSIS — O99891 Other specified diseases and conditions complicating pregnancy: Secondary | ICD-10-CM | POA: Diagnosis not present

## 2020-06-29 ENCOUNTER — Ambulatory Visit (INDEPENDENT_AMBULATORY_CARE_PROVIDER_SITE_OTHER): Payer: 59 | Admitting: Family Medicine

## 2020-06-29 ENCOUNTER — Encounter: Payer: Self-pay | Admitting: Family Medicine

## 2020-06-29 DIAGNOSIS — F419 Anxiety disorder, unspecified: Secondary | ICD-10-CM | POA: Diagnosis not present

## 2020-06-29 NOTE — Progress Notes (Signed)
Ariana Parks - 40 y.o. female MRN [id number]  Date of birth: 04-01-1981  Subjective Chief Complaint  Patient presents with  . Anxiety    HPI [name] is a G1 P0 at 35 weeks 3 days here today for follow-up of anxiety. She is currently followed by OB/GYN and will was diagnosed with gestational diabetes. This is currently managed with glyburide. She has found out that she is having a girl. She is excited yet anxious about being a mother. She has good support from family. She reports that sertraline continues to work well for her anxiety. She is tolerating this well without side effects. She does plan to breast-feed after delivery of her child.   GAD 7 : Generalized Anxiety Score 06/29/2020 04/19/2020 02/08/2020 01/14/2020  Nervous, Anxious, on Edge 2 2 2 2   Control/stop worrying 2 2 2 2   Worry too much - different things 2 3 2 2   Trouble relaxing 1 2 2 2   Restless 1 2 2 2   Easily annoyed or irritable 1 2 2 2   Afraid - awful might happen 1 2 2 2   Total GAD 7 Score 10 15 14 14   Anxiety Difficulty Somewhat difficult Somewhat difficult Somewhat difficult Somewhat difficult    Depression screen [facility] 2/9 06/29/2020 05/23/2020 04/19/2020  Decreased Interest 1 0 2  Down, Depressed, Hopeless 1 0 3  PHQ - 2 Score 2 0 5  Altered sleeping 2 - 3  Tired, decreased energy 2 - 2  Change in appetite 1 - 2  Feeling bad or failure about yourself  1 - 2  Trouble concentrating 2 - 2  Moving slowly or fidgety/restless 0 - 2  Suicidal thoughts 0 - 1  PHQ-9 Score 10 - 19  Difficult doing work/chores Somewhat difficult - Somewhat difficult  Some recent data might be hidden    ROS:  A comprehensive ROS was completed and negative except as noted per HPI  Allergies  Allergen Reactions  . Celexa [Citalopram Hydrobromide]   . Citalopram   . Ibuprofen     High doses  . Prednisone   . Prilosec [Omeprazole]   . Wellbutrin [Bupropion]   . Flexeril [Cyclobenzaprine] Other (See Comments)    Dreams     Past Medical History:  Diagnosis Date  . Abnormal Pap smear of cervix 2005   Mod dysplasia  . Depression   . GERD (gastroesophageal reflux disease)   . Gestational diabetes mellitus (GDM) affecting first pregnancy   . High cholesterol   . Nonocclusive mesenteric ischemia ([place]) 04/04/2018   Dx colonscopy Aug 2019 [facility]  . OSA (obstructive sleep apnea) 07/05/2016  . Panic attack   . PCOS (polycystic ovarian syndrome)     Past Surgical History:  Procedure Laterality Date  . KNEE SURGERY    . LEEP    . TONSILLECTOMY    . WISDOM TOOTH EXTRACTION      Social History   Socioeconomic History  . Marital status: Single    Spouse name: Not on file  . Number of children: Not on file  . Years of education: Not on file  . Highest education level: Not on file  Occupational History  . Occupation: Designer, jewellery  Tobacco Use  . Smoking status: Former Smoker    Packs/day: 0.50    Years: 15.00    Pack years: 7.50    Types: Cigarettes    Quit date: 05/25/2020    Years since quitting: 0.0  . Smokeless tobacco: Never Used  . Tobacco comment: using  vapes  Vaping Use  . Vaping Use: Never used  Substance and Sexual Activity  . Alcohol use: Not Currently    Alcohol/week: 0.0 standard drinks  . Drug use: No  . Sexual activity: Yes    Partners: Male    Birth control/protection: None  Other Topics Concern  . Not on file  Social History Narrative  . Not on file   Social Determinants of Health   Financial Resource Strain: Not on file  Food Insecurity: No Food Insecurity  . Worried About Programme researcher, broadcasting/film/video in the Last Year: Never true  . Ran Out of Food in the Last Year: Never true  Transportation Needs: Not on file  Physical Activity: Not on file  Stress: Not on file  Social Connections: Not on file    Family History  Problem Relation Age of Onset  . Hypertension Mother   . Thyroid disease Mother   . GER disease Father   . Migraines Father   . Heart attack Other   .  Thyroid disease Brother     Health Maintenance  Topic Date Due  . Hepatitis C Screening  Never done  . PAP SMEAR-Modifier  08/09/2007  . COVID-19 Vaccine (2 - Pfizer 2-dose series) 06/14/2020  . COLONOSCOPY (Pts 45-93yrs Insurance coverage will need to be confirmed)  02/07/2023  . TETANUS/TDAP  05/03/2030  . INFLUENZA VACCINE  Completed  . HIV Screening  Completed     ----------------------------------------------------------------------------------------------------------------------------------------------------------------------------------------------------------------- Physical Exam BP 139/81 (BP Location: Left Arm, Patient Position: Sitting, Cuff Size: Large)   Pulse 100   Wt 291 lb 1.9 oz (132.1 kg)   SpO2 96%   BMI 42.99 kg/m   Physical Exam Constitutional:      Appearance: Normal appearance.  Cardiovascular:     Rate and Rhythm: Normal rate and regular rhythm.  Pulmonary:     Effort: Pulmonary effort is normal.     Breath sounds: Normal breath sounds.  Neurological:     General: No focal deficit present.     Mental Status: She is alert.  Psychiatric:        Mood and Affect: Mood normal.        Behavior: Behavior normal.     ------------------------------------------------------------------------------------------------------------------------------------------------------------------------------------------------------------------- Assessment and Plan  Anxiety She continues to do well with sertraline at current strength. She does not have any significant side effects at this time. She does plan to breast-feed so sertraline will is a good choice for her to continue postpartum. I will plan to see her back in about 3 months.   No orders of the defined types were placed in this encounter.   Return in about 3 months (around 09/27/2020) for F/u sertraline.    This visit occurred during the SARS-CoV-2 public health emergency.  Safety protocols were in place,  including screening questions prior to the visit, additional usage of staff PPE, and extensive cleaning of exam room while observing appropriate contact time as indicated for disinfecting solutions.

## 2020-06-29 NOTE — Assessment & Plan Note (Signed)
She continues to do well with sertraline at current strength. She does not have any significant side effects at this time. She does plan to breast-feed so sertraline will is a good choice for her to continue postpartum. I will plan to see her back in about 3 months.

## 2020-06-29 NOTE — Patient Instructions (Signed)
Great to see you today! Continue sertraline at current strength See me again in 3 months.

## 2020-07-04 LAB — OB RESULTS CONSOLE GBS: GBS: NEGATIVE

## 2020-07-13 ENCOUNTER — Encounter (HOSPITAL_COMMUNITY): Payer: Self-pay

## 2020-07-13 NOTE — Patient Instructions (Addendum)
Ariana Parks  07/13/2020   Your procedure is scheduled on:  07/24/2020  Arrive at [place] at Entrance C on [address] at [facility]  and [address]. You are invited to use the FREE valet parking or use the Visitor's parking deck.  Pick up the phone at the desk and dial [phone].  Call this number if you have problems the morning of surgery: [phone]  Remember:   Do not eat food:(After Midnight) Desps de medianoche.  Do not drink clear liquids: (After Midnight) Desps de medianoche.  Take these medicines the morning of surgery with A SIP OF WATER:  none   Do not wear jewelry, make-up or nail polish.  Do not wear lotions, powders, or perfumes. Do not wear deodorant.  Do not shave 48 hours prior to surgery.  Do not bring valuables to the hospital.  [facility] is not   responsible for any belongings or valuables brought to the hospital.  Contacts, dentures or bridgework may not be worn into surgery.  Leave suitcase in the car. After surgery it may be brought to your room.  For patients admitted to the hospital, checkout time is 11:00 AM the day of              discharge.      Please read over the following fact sheets that you were given:     Preparing for Surgery

## 2020-07-18 NOTE — H&P (Signed)
Ariana Parks is a 40 y.o. female presenting for primary cesarean section for unstable fetal lie. U/S notes baby alternately breech and vtx. She wants to keep plans for primary cesarean section. Pregnancy complicated by L8VFI on glyburide 2.5 mg @ HS with good control, obesity, sleep apnea, AMA-no screen due to late entry into Ariana Parks @ 27 wks, depression on sertraline, Hx of LEEP. U/S 06/23/20 noted EFW 6# 1oz (86%). OB History    Gravida  1   Para  0   Term  0   Preterm  0   AB  0   Living  0     SAB  0   IAB  0   Ectopic  0   Multiple  0   Live Births             Past Medical History:  Diagnosis Date  . Abnormal Pap smear of cervix 2005   Mod dysplasia  . Depression   . Endometriosis   . GERD (gastroesophageal reflux disease)   . Gestational diabetes mellitus (GDM) affecting first pregnancy   . High cholesterol   . Nonocclusive mesenteric ischemia (Ariana Parks) 04/04/2018   Dx colonscopy Aug 2019 [facility]  . OSA (obstructive sleep apnea) 07/05/2016  . Panic attack   . PCOS (polycystic ovarian syndrome)    Past Surgical History:  Procedure Laterality Date  . KNEE SURGERY    . LEEP    . TONSILLECTOMY    . WISDOM TOOTH EXTRACTION     Family History: family history includes GER disease in her father; Heart attack in an other family member; Hypertension in her mother; Migraines in her father; Thyroid disease in her brother and mother. Social History:  reports that she quit smoking about 7 weeks ago. Her smoking use included cigarettes. She has a 7.50 pack-year smoking history. She has never used smokeless tobacco. She reports previous alcohol use. She reports that she does not use drugs.     Maternal Diabetes: Yes:  Diabetes Type:  Insulin/Medication controlled Genetic Screening: not done due to late entry for Ariana Parks Maternal Ultrasounds/Referrals: Normal Fetal Ultrasounds or other Referrals:  None Maternal Substance Abuse:  No Significant Maternal Medications:  Meds include:  Other: glyburide, sertraline Significant Maternal Lab Results:  Group B Strep negative Other Comments:  None  Review of Systems  Constitutional: Negative for fever.  Eyes: Negative for visual disturbance.  Gastrointestinal: Negative for abdominal pain.  Neurological: Negative for headaches.   Maternal Medical History:  Fetal activity: Perceived fetal activity is normal.        There were no vitals taken for this visit. Exam Physical Exam Cardiovascular:     Rate and Rhythm: Normal rate.  Pulmonary:     Effort: Pulmonary effort is normal.     Prenatal labs: ABO, Rh:   Antibody:   Rubella:   RPR:    HBsAg:    HIV:    GBS:   negative 07/04/20  Assessment/Plan: 40 yo G1P0 @ 39 0/7 weeks  A2GDM Unstable fetal lie D/W cesarean section and risks including infection, organ damage, bleeding/transfusion-HIV/Hep, DVT/PE, pneumonia. She states she understands and agrees.   Shon Millet II 07/18/2020, 5:22 PM

## 2020-07-22 ENCOUNTER — Other Ambulatory Visit (HOSPITAL_COMMUNITY)
Admission: RE | Admit: 2020-07-22 | Discharge: 2020-07-22 | Disposition: A | Discharge status: Home / Self Care | Payer: 59 | Source: Ambulatory Visit | Attending: Obstetrics and Gynecology | Admitting: Obstetrics and Gynecology

## 2020-07-22 ENCOUNTER — Other Ambulatory Visit: Payer: Self-pay

## 2020-07-22 ENCOUNTER — Encounter (HOSPITAL_COMMUNITY): Payer: Self-pay | Admitting: Obstetrics and Gynecology

## 2020-07-22 ENCOUNTER — Encounter (HOSPITAL_COMMUNITY)
Admission: RE | Admit: 2020-07-22 | Discharge: 2020-07-22 | Disposition: A | Discharge status: Home / Self Care | Payer: 59 | Source: Ambulatory Visit | Attending: Obstetrics and Gynecology | Admitting: Obstetrics and Gynecology

## 2020-07-22 DIAGNOSIS — Z01812 Encounter for preprocedural laboratory examination: Secondary | ICD-10-CM | POA: Insufficient documentation

## 2020-07-22 DIAGNOSIS — Z20822 Contact with and (suspected) exposure to covid-19: Secondary | ICD-10-CM | POA: Insufficient documentation

## 2020-07-22 HISTORY — DX: Endometriosis, unspecified: N80.9

## 2020-07-22 LAB — TYPE AND SCREEN
ABO/RH(D): A NEG
Antibody Screen: NEGATIVE

## 2020-07-22 LAB — CBC
HCT: 35.3 % — ABNORMAL LOW (ref 36.0–46.0)
Hemoglobin: 11.9 g/dL — ABNORMAL LOW (ref 12.0–15.0)
MCH: 30.6 pg (ref 26.0–34.0)
MCHC: 33.7 g/dL (ref 30.0–36.0)
MCV: 90.7 fL (ref 80.0–100.0)
Platelets: 236 10*3/uL (ref 150–400)
RBC: 3.89 MIL/uL (ref 3.87–5.11)
RDW: 13.8 % (ref 11.5–15.5)
WBC: 10.4 10*3/uL (ref 4.0–10.5)
nRBC: 0 % (ref 0.0–0.2)

## 2020-07-22 LAB — SARS CORONAVIRUS 2 (TAT 6-24 HRS): SARS Coronavirus 2: NEGATIVE

## 2020-07-22 NOTE — Anesthesia Preprocedure Evaluation (Addendum)
Anesthesia Evaluation  Patient identified by MRN, date of birth, ID band Patient awake    Reviewed: Allergy & Precautions, NPO status , Patient's Chart, lab work & pertinent test results  Airway Mallampati: II  TM Distance: >3 FB Neck ROM: Full    Dental no notable dental hx. (+) Loose, Caps, Dental Advisory Given   Pulmonary sleep apnea , Patient abstained from smoking., former smoker,  Covid-19 12/21, recovered and asymptomatic now   Pulmonary exam normal breath sounds clear to auscultation       Cardiovascular negative cardio ROS Normal cardiovascular exam Rhythm:Regular Rate:Normal     Neuro/Psych PSYCHIATRIC DISORDERS Anxiety Depression negative neurological ROS     GI/Hepatic Neg liver ROS, GERD  Medicated and Controlled,Hx/o mesenteric ischemia   Endo/Other  diabetes, Well Controlled, GestationalMorbid obesityPCOS  Renal/GU negative Renal ROS  negative genitourinary   Musculoskeletal negative musculoskeletal ROS (+)   Abdominal (+) + obese,   Peds  Hematology  (+) anemia ,   Anesthesia Other Findings   Reproductive/Obstetrics (+) Pregnancy Breech presentation AMA 39 weeks                           Anesthesia Physical Anesthesia Plan  ASA: III  Anesthesia Plan: Spinal   Post-op Pain Management:    Induction:   PONV Risk Score and Plan: Treatment may vary due to age or medical condition and Scopolamine patch - Pre-op  Airway Management Planned: Natural Airway  Additional Equipment:   Intra-op Plan:   Post-operative Plan:   Informed Consent: I have reviewed the patients History and Physical, chart, labs and discussed the procedure including the risks, benefits and alternatives for the proposed anesthesia with the patient or authorized representative who has indicated his/her understanding and acceptance.     Dental advisory given  Plan Discussed with: CRNA and  Anesthesiologist  Anesthesia Plan Comments:        Anesthesia Quick Evaluation

## 2020-07-23 LAB — RPR: RPR Ser Ql: NONREACTIVE

## 2020-07-24 ENCOUNTER — Inpatient Hospital Stay (HOSPITAL_COMMUNITY): Payer: 59 | Admitting: Anesthesiology

## 2020-07-24 ENCOUNTER — Encounter (HOSPITAL_COMMUNITY)
Admission: RE | Disposition: A | Discharge status: Home / Self Care | Payer: Self-pay | Source: Home / Self Care | Attending: Obstetrics and Gynecology

## 2020-07-24 ENCOUNTER — Inpatient Hospital Stay (HOSPITAL_COMMUNITY)
Admission: RE | Admit: 2020-07-24 | Discharge: 2020-07-27 | DRG: 788 | Disposition: A | Discharge status: Home / Self Care | Payer: 59 | Attending: Obstetrics and Gynecology | Admitting: Obstetrics and Gynecology

## 2020-07-24 ENCOUNTER — Encounter (HOSPITAL_COMMUNITY): Payer: Self-pay | Admitting: Obstetrics and Gynecology

## 2020-07-24 ENCOUNTER — Other Ambulatory Visit: Payer: Self-pay

## 2020-07-24 DIAGNOSIS — O320XX0 Maternal care for unstable lie, not applicable or unspecified: Secondary | ICD-10-CM | POA: Diagnosis present

## 2020-07-24 DIAGNOSIS — Z20822 Contact with and (suspected) exposure to covid-19: Secondary | ICD-10-CM | POA: Diagnosis present

## 2020-07-24 DIAGNOSIS — O24425 Gestational diabetes mellitus in childbirth, controlled by oral hypoglycemic drugs: Secondary | ICD-10-CM | POA: Diagnosis present

## 2020-07-24 DIAGNOSIS — Z3A39 39 weeks gestation of pregnancy: Secondary | ICD-10-CM | POA: Diagnosis not present

## 2020-07-24 DIAGNOSIS — Z87891 Personal history of nicotine dependence: Secondary | ICD-10-CM | POA: Diagnosis not present

## 2020-07-24 DIAGNOSIS — O321XX0 Maternal care for breech presentation, not applicable or unspecified: Secondary | ICD-10-CM | POA: Diagnosis present

## 2020-07-24 DIAGNOSIS — O99214 Obesity complicating childbirth: Secondary | ICD-10-CM | POA: Diagnosis present

## 2020-07-24 LAB — GLUCOSE, CAPILLARY
Glucose-Capillary: 85 mg/dL (ref 70–99)
Glucose-Capillary: 94 mg/dL (ref 70–99)

## 2020-07-24 SURGERY — Surgical Case
Anesthesia: Spinal

## 2020-07-24 MED ORDER — NALBUPHINE HCL 10 MG/ML IJ SOLN: 5.0000 mg | Freq: Once | INTRAMUSCULAR | Status: DC | PRN

## 2020-07-24 MED ORDER — LACTATED RINGERS IV SOLN: INTRAVENOUS | Status: DC

## 2020-07-24 MED ORDER — MORPHINE SULFATE (PF) 0.5 MG/ML IJ SOLN
INTRAMUSCULAR | Status: AC
  Filled 2020-07-24: qty 10

## 2020-07-24 MED ORDER — PHENYLEPHRINE HCL-NACL 20-0.9 MG/250ML-% IV SOLN
INTRAVENOUS | Status: AC
  Filled 2020-07-24: qty 250

## 2020-07-24 MED ORDER — NALBUPHINE HCL 10 MG/ML IJ SOLN: 5.0000 mg | INTRAMUSCULAR | Status: DC | PRN

## 2020-07-24 MED ORDER — OXYTOCIN-SODIUM CHLORIDE 30-0.9 UT/500ML-% IV SOLN
INTRAVENOUS | Status: AC
  Filled 2020-07-24: qty 500

## 2020-07-24 MED ORDER — WITCH HAZEL-GLYCERIN EX PADS: 1.0000 "application " | MEDICATED_PAD | CUTANEOUS | Status: DC | PRN

## 2020-07-24 MED ORDER — DEXTROSE 5 % IV SOLN
3.0000 g | INTRAVENOUS | Status: AC
  Administered 2020-07-24: 3 g via INTRAVENOUS

## 2020-07-24 MED ORDER — SODIUM CHLORIDE 0.9% FLUSH: 3.0000 mL | INTRAVENOUS | Status: DC | PRN

## 2020-07-24 MED ORDER — FENTANYL CITRATE (PF) 100 MCG/2ML IJ SOLN
INTRAMUSCULAR | Status: DC | PRN
  Administered 2020-07-24: 15 ug via INTRATHECAL

## 2020-07-24 MED ORDER — SERTRALINE HCL 100 MG PO TABS
100.0000 mg | ORAL_TABLET | Freq: Every day | ORAL | Status: DC
  Administered 2020-07-24 – 2020-07-26 (×3): 100 mg via ORAL
  Filled 2020-07-24 (×3): qty 1

## 2020-07-24 MED ORDER — SENNOSIDES-DOCUSATE SODIUM 8.6-50 MG PO TABS
2.0000 | ORAL_TABLET | Freq: Every day | ORAL | Status: DC
  Administered 2020-07-25 – 2020-07-27 (×3): 2 via ORAL
  Filled 2020-07-24 (×3): qty 2

## 2020-07-24 MED ORDER — MORPHINE SULFATE (PF) 0.5 MG/ML IJ SOLN: INTRAMUSCULAR | Status: DC | PRN

## 2020-07-24 MED ORDER — ONDANSETRON HCL 4 MG/2ML IJ SOLN: 4.0000 mg | Freq: Three times a day (TID) | INTRAMUSCULAR | Status: DC | PRN

## 2020-07-24 MED ORDER — FENTANYL CITRATE (PF) 100 MCG/2ML IJ SOLN: INTRAMUSCULAR | Status: DC | PRN

## 2020-07-24 MED ORDER — STERILE WATER FOR IRRIGATION IR SOLN
Status: DC | PRN
  Administered 2020-07-24: 1000 mL

## 2020-07-24 MED ORDER — MEPERIDINE HCL 25 MG/ML IJ SOLN: 6.2500 mg | INTRAMUSCULAR | Status: DC | PRN

## 2020-07-24 MED ORDER — NALOXONE HCL 4 MG/10ML IJ SOLN
1.0000 ug/kg/h | INTRAVENOUS | Status: DC | PRN
  Filled 2020-07-24: qty 5

## 2020-07-24 MED ORDER — SOD CITRATE-CITRIC ACID 500-334 MG/5ML PO SOLN
30.0000 mL | ORAL | Status: AC
  Administered 2020-07-24: 30 mL via ORAL

## 2020-07-24 MED ORDER — TETANUS-DIPHTH-ACELL PERTUSSIS 5-2.5-18.5 LF-MCG/0.5 IM SUSY: 0.5000 mL | PREFILLED_SYRINGE | Freq: Once | INTRAMUSCULAR | Status: DC

## 2020-07-24 MED ORDER — DIPHENHYDRAMINE HCL 25 MG PO CAPS: 25.0000 mg | ORAL_CAPSULE | Freq: Four times a day (QID) | ORAL | Status: DC | PRN

## 2020-07-24 MED ORDER — KETOROLAC TROMETHAMINE 30 MG/ML IJ SOLN
30.0000 mg | Freq: Four times a day (QID) | INTRAMUSCULAR | Status: AC | PRN
  Administered 2020-07-24 – 2020-07-25 (×3): 30 mg via INTRAVENOUS
  Filled 2020-07-24 (×2): qty 1

## 2020-07-24 MED ORDER — FENTANYL CITRATE (PF) 100 MCG/2ML IJ SOLN
INTRAMUSCULAR | Status: AC
  Filled 2020-07-24: qty 2

## 2020-07-24 MED ORDER — ONDANSETRON HCL 4 MG/2ML IJ SOLN
INTRAMUSCULAR | Status: DC | PRN
  Administered 2020-07-24: 4 mg via INTRAVENOUS

## 2020-07-24 MED ORDER — COCONUT OIL OIL: 1.0000 "application " | TOPICAL_OIL | Status: DC | PRN

## 2020-07-24 MED ORDER — BUPIVACAINE IN DEXTROSE 0.75-8.25 % IT SOLN
INTRATHECAL | Status: DC | PRN
  Administered 2020-07-24: 2 mL via INTRATHECAL

## 2020-07-24 MED ORDER — DIPHENHYDRAMINE HCL 25 MG PO CAPS: 25.0000 mg | ORAL_CAPSULE | ORAL | Status: DC | PRN

## 2020-07-24 MED ORDER — PHENYLEPHRINE HCL-NACL 20-0.9 MG/250ML-% IV SOLN
INTRAVENOUS | Status: DC | PRN
  Administered 2020-07-24: 60 ug/min via INTRAVENOUS

## 2020-07-24 MED ORDER — ACETAMINOPHEN 500 MG PO TABS
1000.0000 mg | ORAL_TABLET | Freq: Four times a day (QID) | ORAL | Status: DC
  Administered 2020-07-24 – 2020-07-27 (×11): 1000 mg via ORAL
  Filled 2020-07-24 (×11): qty 2

## 2020-07-24 MED ORDER — KETOROLAC TROMETHAMINE 30 MG/ML IJ SOLN: 30.0000 mg | Freq: Four times a day (QID) | INTRAMUSCULAR | Status: AC | PRN

## 2020-07-24 MED ORDER — SIMETHICONE 80 MG PO CHEW
80.0000 mg | CHEWABLE_TABLET | Freq: Three times a day (TID) | ORAL | Status: DC
  Administered 2020-07-24 – 2020-07-27 (×8): 80 mg via ORAL
  Filled 2020-07-24 (×9): qty 1

## 2020-07-24 MED ORDER — DEXTROSE 5 % IV SOLN
INTRAVENOUS | Status: AC
  Filled 2020-07-24: qty 3000

## 2020-07-24 MED ORDER — PRENATAL MULTIVITAMIN CH
1.0000 | ORAL_TABLET | Freq: Every day | ORAL | Status: DC
  Administered 2020-07-24 – 2020-07-27 (×4): 1 via ORAL
  Filled 2020-07-24 (×4): qty 1

## 2020-07-24 MED ORDER — PHENYLEPHRINE HCL (PRESSORS) 10 MG/ML IV SOLN
INTRAVENOUS | Status: DC | PRN
  Administered 2020-07-24 (×4): 80 ug via INTRAVENOUS

## 2020-07-24 MED ORDER — OXYTOCIN-SODIUM CHLORIDE 30-0.9 UT/500ML-% IV SOLN: 2.5000 [IU]/h | INTRAVENOUS | Status: AC

## 2020-07-24 MED ORDER — MORPHINE SULFATE (PF) 0.5 MG/ML IJ SOLN
INTRAMUSCULAR | Status: DC | PRN
  Administered 2020-07-24: .15 mg via INTRATHECAL

## 2020-07-24 MED ORDER — ONDANSETRON HCL 4 MG/2ML IJ SOLN
INTRAMUSCULAR | Status: AC
  Filled 2020-07-24: qty 2

## 2020-07-24 MED ORDER — MENTHOL 3 MG MT LOZG: 1.0000 | LOZENGE | OROMUCOSAL | Status: DC | PRN

## 2020-07-24 MED ORDER — DIBUCAINE (PERIANAL) 1 % EX OINT: 1.0000 "application " | TOPICAL_OINTMENT | CUTANEOUS | Status: DC | PRN

## 2020-07-24 MED ORDER — NALOXONE HCL 0.4 MG/ML IJ SOLN: 0.4000 mg | INTRAMUSCULAR | Status: DC | PRN

## 2020-07-24 MED ORDER — SODIUM CHLORIDE 0.9 % IR SOLN
Status: DC | PRN
  Administered 2020-07-24: 1

## 2020-07-24 MED ORDER — SOD CITRATE-CITRIC ACID 500-334 MG/5ML PO SOLN
ORAL | Status: AC
  Filled 2020-07-24: qty 30

## 2020-07-24 MED ORDER — OXYCODONE HCL 5 MG PO TABS
5.0000 mg | ORAL_TABLET | ORAL | Status: DC | PRN
  Administered 2020-07-25: 10 mg via ORAL
  Administered 2020-07-25 (×3): 5 mg via ORAL
  Administered 2020-07-26: 10 mg via ORAL
  Administered 2020-07-26: 5 mg via ORAL
  Administered 2020-07-26 – 2020-07-27 (×5): 10 mg via ORAL
  Filled 2020-07-24: qty 1
  Filled 2020-07-24: qty 2
  Filled 2020-07-24 (×2): qty 1
  Filled 2020-07-24 (×2): qty 2
  Filled 2020-07-24 (×2): qty 1
  Filled 2020-07-24: qty 2
  Filled 2020-07-24: qty 1
  Filled 2020-07-24 (×2): qty 2

## 2020-07-24 MED ORDER — DIPHENHYDRAMINE HCL 50 MG/ML IJ SOLN: 12.5000 mg | INTRAMUSCULAR | Status: DC | PRN

## 2020-07-24 MED ORDER — KETOROLAC TROMETHAMINE 30 MG/ML IJ SOLN
INTRAMUSCULAR | Status: AC
  Filled 2020-07-24: qty 1

## 2020-07-24 MED ORDER — ZOLPIDEM TARTRATE 5 MG PO TABS: 5.0000 mg | ORAL_TABLET | Freq: Every evening | ORAL | Status: DC | PRN

## 2020-07-24 MED ORDER — SIMETHICONE 80 MG PO CHEW: 80.0000 mg | CHEWABLE_TABLET | ORAL | Status: DC | PRN

## 2020-07-24 MED ORDER — FLUTICASONE PROPIONATE 50 MCG/ACT NA SUSP
1.0000 | Freq: Every day | NASAL | Status: DC
  Filled 2020-07-24: qty 16

## 2020-07-24 MED ORDER — METHOCARBAMOL 500 MG PO TABS
500.0000 mg | ORAL_TABLET | Freq: Three times a day (TID) | ORAL | Status: DC | PRN
  Filled 2020-07-24: qty 1

## 2020-07-24 MED ORDER — POVIDONE-IODINE 10 % EX SWAB
2.0000 "application " | Freq: Once | CUTANEOUS | Status: AC
  Administered 2020-07-24: 2 via TOPICAL

## 2020-07-24 MED ORDER — FENTANYL CITRATE (PF) 100 MCG/2ML IJ SOLN
25.0000 ug | INTRAMUSCULAR | Status: DC | PRN
Start: 2020-07-24 — End: 2020-07-24

## 2020-07-24 MED ORDER — HYDROMORPHONE HCL 1 MG/ML IJ SOLN: 0.2000 mg | INTRAMUSCULAR | Status: DC | PRN

## 2020-07-24 SURGICAL SUPPLY — 41 items
ADH SKN CLS APL DERMABOND .7 (GAUZE/BANDAGES/DRESSINGS)
APL SKNCLS STERI-STRIP NONHPOA (GAUZE/BANDAGES/DRESSINGS) ×1
BENZOIN TINCTURE PRP APPL 2/3 (GAUZE/BANDAGES/DRESSINGS) ×1 IMPLANT
CHLORAPREP W/TINT 26ML (MISCELLANEOUS) ×2 IMPLANT
CLAMP CORD UMBIL (MISCELLANEOUS) IMPLANT
CLOSURE STERI STRIP 1/2 X4 (GAUZE/BANDAGES/DRESSINGS) ×1 IMPLANT
CLOTH BEACON ORANGE TIMEOUT ST (SAFETY) ×2 IMPLANT
DERMABOND ADVANCED (GAUZE/BANDAGES/DRESSINGS)
DERMABOND ADVANCED .7 DNX12 (GAUZE/BANDAGES/DRESSINGS) IMPLANT
DRSG OPSITE POSTOP 4X10 (GAUZE/BANDAGES/DRESSINGS) ×2 IMPLANT
ELECT REM PT RETURN 9FT ADLT (ELECTROSURGICAL) ×2
ELECTRODE REM PT RTRN 9FT ADLT (ELECTROSURGICAL) ×1 IMPLANT
EXTRACTOR VACUUM M CUP 4 TUBE (SUCTIONS) IMPLANT
GLOVE BIO SURGEON STRL SZ7.5 (GLOVE) ×2 IMPLANT
GLOVE BIOGEL PI IND STRL 7.0 (GLOVE) ×1 IMPLANT
GLOVE BIOGEL PI INDICATOR 7.0 (GLOVE) ×1
GOWN STRL REUS W/TWL LRG LVL3 (GOWN DISPOSABLE) ×4 IMPLANT
KIT ABG SYR 3ML LUER SLIP (SYRINGE) ×2 IMPLANT
MAT PREVALON FULL STRYKER (MISCELLANEOUS) ×1 IMPLANT
NDL HYPO 25X5/8 SAFETYGLIDE (NEEDLE) ×1 IMPLANT
NEEDLE HYPO 25X5/8 SAFETYGLIDE (NEEDLE) ×2 IMPLANT
NS IRRIG 1000ML POUR BTL (IV SOLUTION) ×2 IMPLANT
PACK C SECTION WH (CUSTOM PROCEDURE TRAY) ×2 IMPLANT
PAD ABD 7.5X8 STRL (GAUZE/BANDAGES/DRESSINGS) ×2 IMPLANT
PAD OB MATERNITY 4.3X12.25 (PERSONAL CARE ITEMS) ×2 IMPLANT
PENCIL SMOKE EVAC W/HOLSTER (ELECTROSURGICAL) ×2 IMPLANT
RETRACTOR TRAXI PANNICULUS (MISCELLANEOUS) IMPLANT
SPONGE GAUZE 4X4 12PLY STER LF (GAUZE/BANDAGES/DRESSINGS) ×2 IMPLANT
STRIP CLOSURE SKIN 1/2X4 (GAUZE/BANDAGES/DRESSINGS) IMPLANT
SUT MNCRL 0 VIOLET CTX 36 (SUTURE) ×4 IMPLANT
SUT MONOCRYL 0 CTX 36 (SUTURE) ×8
SUT PDS AB 0 CTX 60 (SUTURE) ×2 IMPLANT
SUT PLAIN 0 NONE (SUTURE) IMPLANT
SUT PLAIN 2 0 (SUTURE)
SUT PLAIN 2 0 XLH (SUTURE) IMPLANT
SUT PLAIN ABS 2-0 CT1 27XMFL (SUTURE) IMPLANT
SUT VIC AB 4-0 KS 27 (SUTURE) ×2 IMPLANT
TOWEL OR 17X24 6PK STRL BLUE (TOWEL DISPOSABLE) ×2 IMPLANT
TRAXI PANNICULUS RETRACTOR (MISCELLANEOUS) ×1
TRAY FOLEY W/BAG SLVR 14FR LF (SET/KITS/TRAYS/PACK) ×2 IMPLANT
WATER STERILE IRR 1000ML POUR (IV SOLUTION) ×2 IMPLANT

## 2020-07-24 NOTE — Transfer of Care (Signed)
Immediate Anesthesia Transfer of Care Note  Patient: Ariana Parks  Procedure(s) Performed: PRIMARY CESAREAN SECTION (N/A )  Patient Location: PACU  Anesthesia Type:Spinal  Level of Consciousness: awake, alert , oriented and patient cooperative  Airway & Oxygen Therapy: Patient Spontanous Breathing  Post-op Assessment: Report given to RN and Post -op Vital signs reviewed and stable  Post vital signs: Reviewed and stable  Last Vitals:  Vitals Value Taken Time  BP    Temp    Pulse 88 07/24/20 0929  Resp 18 07/24/20 0929  SpO2 98 % 07/24/20 0929  Vitals shown include unvalidated device data.  Last Pain:  Vitals:   07/24/20 0616  TempSrc: Oral         Complications: No complications documented.

## 2020-07-24 NOTE — Anesthesia Procedure Notes (Signed)
Spinal  Patient location during procedure: OR Start time: 07/24/2020 7:57 AM End time: 07/24/2020 8:01 AM Staffing Performed: anesthesiologist  Anesthesiologist: Josephine Igo, MD Preanesthetic Checklist Completed: patient identified, IV checked, site marked, risks and benefits discussed, surgical consent, monitors and equipment checked, pre-op evaluation and timeout performed Spinal Block Patient position: sitting Prep: DuraPrep and site prepped and draped Patient monitoring: heart rate, cardiac monitor, continuous pulse ox and blood pressure Approach: midline Location: L3-4 Injection technique: single-shot Needle Needle type: Sprotte and Pencan  Needle gauge: 24 G Needle length: 9 cm Needle insertion depth: 8 cm Assessment Sensory level: T4 Additional Notes Patient tolerated procedure well. Adequate sensory level.

## 2020-07-24 NOTE — Brief Op Note (Signed)
07/24/2020  9:22 AM  PATIENT:  Ariana Parks  40 y.o. female  PRE-OPERATIVE DIAGNOSIS:  unstable lie  POST-OPERATIVE DIAGNOSIS:  unstable lie  PROCEDURE:  Procedure(s): PRIMARY CESAREAN SECTION (N/A)  SURGEON:  Surgeon(s) and Role:    * Everlene Farrier, MD - Primary  PHYSICIAN ASSISTANT:   ASSISTANTS: none   ANESTHESIA:   spinal  EBL:  1200 mL   BLOOD ADMINISTERED:none  DRAINS: Urinary Catheter (Foley)   LOCAL MEDICATIONS USED:  NONE  SPECIMEN:  Source of Specimen:  placenta  DISPOSITION OF SPECIMEN:  PATHOLOGY  COUNTS:  YES  TOURNIQUET:  * No tourniquets in log *  DICTATION: .Other Dictation: Dictation Number [id number]  PLAN OF CARE: Admit to inpatient   PATIENT DISPOSITION:  PACU - hemodynamically stable.   Delay start of Pharmacological VTE agent (>24hrs) due to surgical blood loss or risk of bleeding: not applicable

## 2020-07-24 NOTE — Progress Notes (Signed)
No change to H&P per patient history Reviewed procedure-cesarean section All questions answered Patient states she understands and agrees.

## 2020-07-24 NOTE — Lactation Note (Addendum)
This note was copied from a baby's chart. Lactation Consultation Note  Patient Name: Ariana Parks [id number] Date: 07/24/2020 Reason for consult: Initial assessment;Mother's request;Difficult latch;Primapara;1st time breastfeeding;Term;Maternal endocrine disorder Age:39 hrs   Mom has hx of PCOS and Diabetes on glyburide 2.5 mg qhs L2 medication. Mom noted her breasts were heavier during her pregnancy and she had some leakage of colostrum. Mom has a Ameda DEBP at home.    Mom stated infant latched in L and D for 15 minutes. Last feeding on the floor at 1300 for 5-6 minutes. LC attempted to latch infant at the breast but she is sleeping. LC attempted to do some breast massage and hand expression but no drops of colostrum noted.   [place] set Mom on DEBP reviewing parts, assembly, cleaning and milk storage. Mom able to pump 15 ml EBM with first pumping session.   LC attempted to latch infant at the breast but she continues to drop off to sleep. LC went to offer EBM via spoon infant took 0.5 ml and fell back to sleep. Mom is holding infant STS.   Mom taught how to hand express and spoon feed if infant is unable to latch.   Plan 1. To feed based on cues 8-12 x in 24 hour period no more than 4 hours without an attempt. Mom to place infant STS and look for signs of milk transfer.           2. Mom to offer any EBM via spoon or finger feeding if unable to get infant to latch.           3. Mom to use DEBP q 3 hours for 15 minutes.          4. I and O sheet reviewed          5 LC brochure of inpatient and outpatient services reviewed.            6. LC provided education to hold off use of pacifier for the first 4 weeks to establish a good latch.  All questions answered at the end of the visit.    [place] talked with RN Abby Pollus stated infant had fed prior to my visit longer than reported. RN will assess infant's ability to latch at the breast for the next feeding.

## 2020-07-24 NOTE — Op Note (Signed)
NAMEKRYSTALYN, Ariana Parks MEDICAL RECORD [id number] ACCOUNT [id number] DATE OF BIRTH:September 01, 1980 FACILITY: [facility] LOCATION: [place] II, MD  OPERATIVE REPORT  DATE OF PROCEDURE:  07/24/2020  PREOPERATIVE DIAGNOSIS:  Unstable lie.  POSTOPERATIVE DIAGNOSIS:  Unstable lie.  PROCEDURE:  Primary cesarean section.  SURGEON:  Everlene Farrier II, MD  ANESTHESIA:  Spinal; Josephine Igo, MD  ESTIMATED BLOOD LOSS:  1200 mL.  SPECIMENS:  Placenta to pathology.  FINDINGS:  Viable female infant.  Apgars, birth weight, arterial cord pH pending.  INDICATIONS AND CONSENT:  This patient is a 40 year old, G1, P0, at 60 and 0/7 weeks.  Prenatal care has been complicated by gestational diabetes, on glyburide.  She has also had an unstable lie with alternating vertex and breech presentation with the  cervix closed, thick, and high out of the pelvis.  After discussion, she requested a primary cesarean section.  Potential risks and complications were discussed preoperatively including but not limited to infection, organ damage, bleeding requiring  transfusion of blood products with HIV and hepatitis acquisition, DVT, PE, pneumonia, and wound breakdown.  She states she understands and agrees and consent was signed on the chart.  DESCRIPTION OF PROCEDURE:  The patient was taken to the operating room where she was identified.  Spinal anesthetic was placed per Dr. Royce Macadamia, and she was placed in the dorsal supine position with a 15-degree left lateral wedge.  She was prepped  vaginally with Betadine.  Foley catheter was placed and prepped abdominally with ChloraPrep.  A Traxi was used to elevate the panniculus.  Timeout was undertaken.  She was draped in a sterile fashion.  After testing for adequate spinal anesthesia, skin  was entered through a Pfannenstiel incision, and dissection was carried out in layers to the peritoneum.  Peritoneum was sharply entered and extended superiorly and  inferiorly.  Vesicouterine peritoneum was taken down, and the bladder flap was developed.   Bladder blade was placed.  Uterus was incised in a low transverse manner.  On the way in, a venous sinus was encountered with heavy venous bleeding.  This was controlled with pressure while the uterine cavity was entered with hemostats.  Clear fluid  was noted.  The incision was extended with the fingers, and the baby was delivered from the vertex position.  A vacuum extractor was used to deliver the baby's head through the incision with no pop-offs and without difficulty.  The baby was then  delivered.  Good cry and tone were noted.  After 1 minute, the cord was clamped and cut, and the baby was handed to waiting pediatrics team.  Placenta was manually delivered.  Uterine cavity was clean.  Uterus involuted well.  Uterus was closed in 2  running locking imbricating layers of 0 Monocryl suture.  Careful inspection revealed excellent hemostasis.  Lavage was carried out.  Anterior peritoneum was closed in a running fashion with 0 Monocryl, which was also used to reapproximate the  pyramidalis muscle in the midline.  Anterior rectus fascia was closed in a running fashion with a 0 looped PDS with generous bites on the fascia.  The subcutaneous layer was closed with interrupted 2-0 plain, and the skin was closed in a subcuticular  fashion with 4-0 Vicryl on a Keith needle.  Benzoin, Steri-Strips, honeycomb and pressure dressing were applied.  All counts were correct.  The patient was taken to recovery room in stable condition.  IN/NUANCE  D:07/24/2020 T:07/24/2020 JOB:[id number]

## 2020-07-24 NOTE — Anesthesia Postprocedure Evaluation (Signed)
Anesthesia Post Note  Patient: Ariana Parks  Procedure(s) Performed: PRIMARY CESAREAN SECTION (N/A )     Patient location during evaluation: PACU Anesthesia Type: Spinal Level of consciousness: oriented and awake and alert Pain management: pain level controlled Vital Signs Assessment: post-procedure vital signs reviewed and stable Respiratory status: spontaneous breathing, respiratory function stable and nonlabored ventilation Cardiovascular status: blood pressure returned to baseline and stable Postop Assessment: no headache, no backache, no apparent nausea or vomiting, spinal receding and patient able to bend at knees Anesthetic complications: no   No complications documented.  Last Vitals:  Vitals:   07/24/20 0945 07/24/20 1000  BP: (!) 112/41   Pulse: 76   Resp: 16   Temp:  36.6 C  SpO2: 97%     Last Pain:  Vitals:   07/24/20 1000  TempSrc: Axillary   Pain Goal:    LLE Motor Response: Purposeful movement (07/24/20 1000)   RLE Motor Response: Purposeful movement (07/24/20 1000)   L Sensory Level: T12-Inguinal (groin) region (07/24/20 1000) R Sensory Level: T12-Inguinal (groin) region (07/24/20 1000) Epidural/Spinal Function Cutaneous sensation: No Sensation (07/24/20 1000), Patient able to flex knees: No (07/24/20 1000), Patient able to lift hips off bed: No (07/24/20 1000), Back pain beyond tenderness at insertion site: No (07/24/20 1000), Progressively worsening motor and/or sensory loss: No (07/24/20 1000), Bowel and/or bladder incontinence post epidural: No (07/24/20 1000)  Akia Desroches A.

## 2020-07-25 LAB — CBC
HCT: 30.6 % — ABNORMAL LOW (ref 36.0–46.0)
Hemoglobin: 9.6 g/dL — ABNORMAL LOW (ref 12.0–15.0)
MCH: 29.6 pg (ref 26.0–34.0)
MCHC: 31.4 g/dL (ref 30.0–36.0)
MCV: 94.4 fL (ref 80.0–100.0)
Platelets: 201 10*3/uL (ref 150–400)
RBC: 3.24 MIL/uL — ABNORMAL LOW (ref 3.87–5.11)
RDW: 14.1 % (ref 11.5–15.5)
WBC: 9.9 10*3/uL (ref 4.0–10.5)
nRBC: 0 % (ref 0.0–0.2)

## 2020-07-25 LAB — KLEIHAUER-BETKE STAIN
# Vials RhIg: 1
Fetal Cells %: 0 %
Quantitation Fetal Hemoglobin: 0 mL

## 2020-07-25 MED ORDER — ALUM & MAG HYDROXIDE-SIMETH 200-200-20 MG/5ML PO SUSP
30.0000 mL | Freq: Four times a day (QID) | ORAL | Status: DC | PRN
  Administered 2020-07-25: 30 mL via ORAL
  Filled 2020-07-25 (×2): qty 30

## 2020-07-25 MED ORDER — RHO D IMMUNE GLOBULIN 1500 UNIT/2ML IJ SOSY
300.0000 ug | PREFILLED_SYRINGE | Freq: Once | INTRAMUSCULAR | Status: AC
  Administered 2020-07-25: 300 ug via INTRAVENOUS
  Filled 2020-07-25: qty 2

## 2020-07-25 NOTE — Lactation Note (Signed)
This note was copied from a baby's chart. Lactation Consultation Note  Patient Name: Ariana Parks [id number] Date: 07/25/2020 Age:39 hours  [place] student Passion Tamala Julian and LC Cottrell Gentles attempted to visit mother and mother asked for lactation to return at a later time.   Feeding Feeding Type:  (Mom and RN attempted breast and bottle feeding)  [place] Student 07/25/2020, 4:56 PM  Fatina Sprankle A Higuera Ancidey 07/25/2020, 4:58 PM

## 2020-07-25 NOTE — Progress Notes (Signed)
Call Dr. Gaetano Net  To get an order for Mylanta per patients request.Dr. Gaetano Net states that he will put the order in. For acid reflux

## 2020-07-25 NOTE — Progress Notes (Addendum)
MOB was referred for history of depression/anxiety. * Referral screened out by Clinical Social Worker because none of the following criteria appear to apply: ~ History of anxiety/depression during this pregnancy, or of post-partum depression following prior delivery. ~ Diagnosis of anxiety and/or depression within last 3 years OR * MOB's symptoms currently being treated with medication and/or therapy. Per further chart review it is noted that MOB is on Zoloft for depression/panic attacks.   Please contact the Clinical Social Worker if needs arise, by [facility] request, or if MOB scores greater than 9/yes to question 10 on [place] Postpartum Depression Screen.  Virgie Dad Sehaj Mcenroe, MSW, LCSW [facility] [place] [phone]

## 2020-07-25 NOTE — Lactation Note (Signed)
This note was copied from a baby's chart. Lactation Consultation Note Baby 32 hrs old. Baby has been having low glucose but has stabled out. Mom getting ready for bed, has no further questions. Encouraged if mom needs assistance in feeding to call for assistance.  Patient Name: Girl Dazia Lippold OACZY'S Date: 07/25/2020 Reason for consult: Follow-up assessment Age:39 hours  Maternal Data    Feeding    LATCH Score                   Interventions    Lactation Tools Discussed/Used     Consult Status Consult Status: Follow-up Date: 07/26/20 Follow-up type: In-patient    Theodoro Kalata 07/25/2020, 11:08 PM

## 2020-07-25 NOTE — Lactation Note (Incomplete)
This note was copied from a baby's chart. Lactation Consultation Note  Patient Name: Ariana Parks PRXYV'O Date: 07/25/2020   Age:39 hours  [place] student Passion Tamala Julian and LC Linels attempted to visit mother and mother asked for lactation to return at a later time.   Passion Tamala Julian 07/25/2020, 4:56 PM

## 2020-07-25 NOTE — Progress Notes (Signed)
Subjective: Postpartum Day 1: Cesarean Delivery Patient reports tolerating PO, + flatus and no problems voiding.    Objective: Vital signs in last 24 hours: Temp:  [97.3 F (36.3 C)-99 F (37.2 C)] 98.4 F (36.9 C) (01/31 0650) Pulse Rate:  [67-84] 81 (01/31 0650) Resp:  [10-20] 18 (01/31 0650) BP: (99-137)/(41-88) 137/65 (01/31 0650) SpO2:  [95 %-100 %] 100 % (01/31 0650)  Physical Exam:  General: alert Lochia: appropriate Uterine Fundus: firm Incision: healing well DVT Evaluation: No evidence of DVT seen on physical exam.  Recent Labs    07/22/20 1030 07/25/20 0512  HGB 11.9* 9.6*  HCT 35.3* 30.6*    Assessment/Plan: Status post Cesarean section. Doing well postoperatively.  Continue current care.   Tyson Dense 07/25/2020, 9:13 AM

## 2020-07-26 ENCOUNTER — Telehealth: Payer: Self-pay

## 2020-07-26 LAB — SURGICAL PATHOLOGY

## 2020-07-26 NOTE — Telephone Encounter (Signed)
We can see the baby here if she would like.   If she prefers a pediatrician I would recommend [facility] in [place], DNP or Jacquelin Hawking, MD

## 2020-07-26 NOTE — Lactation Note (Signed)
This note was copied from a baby's chart. Lactation Consultation Note  Patient Name: Girl Delilah Mulgrew FAOZH'Y Date: 07/26/2020 Reason for consult: Follow-up assessment Age:39 hours   Mother is a P27, Mother reports that she just attempt for 5 mins at the breast and infant feel asleep and then she gave infant 41 ml of Similac. Mother pumped 8 ml of breastmilk . She reports that she will feed it to her next feeding. Suggested that she page for [facility] to assist with next feeding.  Mother reports that she is pumping every 2-3 hours. She reports that she has a DEBP at home.   Plan of Care : Breastfeed infant with feeding cues Supplement infant with ebm/formula, according to supplemental guidelines. Pump using a DEBP after each feeding for 15-20 mins.   Mother to continue to cue base feed infant and feed at least 8-12 times or more in 24 hours and advised to allow for cluster feeding infant as needed.  Mother to continue to due STS. Mother is aware of available LC services at [facility], BFSG'S, OP Dept, and phone # for questions or concerns about breastfeeding.  Mother receptive to all teaching and plan of care.    Maternal Data    Feeding Mother's Current Feeding Choice: Breast Milk  LATCH Score                    Lactation Tools Discussed/Used    Interventions    Discharge    Consult Status Consult Status: Follow-up Date: 07/27/20 Follow-up type: In-patient    Jess Barters [facility] 07/26/2020, 2:03 PM

## 2020-07-26 NOTE — Progress Notes (Signed)
POD # 2  Doing well Normal post op pain.  O:  BP 121/66 (BP Location: Right Arm)   Pulse 86   Temp 98.2 F (36.8 C) (Oral)   Resp 18   Ht 5\' 9"  (1.753 m)   Wt 134.7 kg   SpO2 100%   Breastfeeding Unknown   BMI 43.86 kg/m  No results found for this or any previous visit (from the past 24 hour(s)). Abdomen is soft and non tender  IMPRESSION: POD # 2   PLAN: Doing well Routine care Routine discharge home tomorrow

## 2020-07-26 NOTE — Telephone Encounter (Signed)
Requesting a recommendation for a Pediatrician for [place]. Please advise.

## 2020-07-27 ENCOUNTER — Encounter: Payer: Self-pay | Admitting: Family Medicine

## 2020-07-27 LAB — RH IG WORKUP (INCLUDES ABO/RH)
ABO/RH(D): A NEG
Fetal Screen: POSITIVE
Gestational Age(Wks): 39
Unit division: 0
Weak D: NEGATIVE

## 2020-07-27 MED ORDER — OXYCODONE HCL 5 MG PO TABS: 5.0000 mg | ORAL_TABLET | ORAL | 0 refills | Status: DC | PRN

## 2020-07-27 MED ORDER — ACETAMINOPHEN 325 MG PO TABS: 650.0000 mg | ORAL_TABLET | Freq: Four times a day (QID) | ORAL | 0 refills | Status: AC | PRN

## 2020-07-27 NOTE — Discharge Summary (Signed)
Obstetric Discharge Summary  Ariana Parks is a 40 y.o. female that presented on 07/24/2020 for scheduled primary C section for unstable lie. She underwent procedure as planned, which was uncomplicated and she delivered a viable female infant on 07/24/20.  Her postpartum course was uncomplicated and on PPD#3, she reported well controlled pain, spontaneous voiding, ambulating without difficulty, and tolerating PO.  She was stable for discharge home on 07/27/20 with plans for in-office follow up.  Hemoglobin  Date Value Ref Range Status  07/25/2020 9.6 (L) 12.0 - 15.0 g/dL Final   HCT  Date Value Ref Range Status  07/25/2020 30.6 (L) 36.0 - 46.0 % Final    Physical Exam:  General: alert and no distress Lochia: appropriate Uterine Fundus: firm Incision: healing well, dressing in place DVT Evaluation: No evidence of DVT seen on physical exam.  Discharge Diagnoses: Term Pregnancy-delivered  Discharge Information: Date: 07/27/2020 Activity: pelvic rest and as tolerated Diet: routine Medications: tylenol, oxycodone Condition: stable Instructions: refer to practice specific booklet Discharge to: home   Newborn Data: Live born female  Birth Weight: 8 lb 2.5 oz (3700 g) APGAR: 8, 9  Newborn Delivery   Birth date/time: 07/24/2020 08:26:00 Delivery type: C-Section, Low Transverse Trial of labor: No C-section categorization: Primary      Home with mother.  Carlyon Shadow 07/27/2020, 9:06 PM

## 2020-07-27 NOTE — Lactation Note (Signed)
This note was copied from a baby's chart. Lactation Consultation Note  Patient Name: Ariana Parks [id number]'S Date: 07/27/2020 Reason for consult: Follow-up assessment;Mother's request;Difficult latch;Primapara;1st time breastfeeding;Term;Maternal endocrine disorder Age: 40 years   Maternal Data    Feeding Mother's Current Feeding Choice: Breast Milk and Formula  LATCH Score Latch: Repeated attempts needed to sustain latch, nipple held in mouth throughout feeding, stimulation needed to elicit sucking reflex.  Audible Swallowing: Spontaneous and intermittent  Type of Nipple: Everted at rest and after stimulation  Comfort (Breast/Nipple): Soft / non-tender  Hold (Positioning): Assistance needed to correctly position infant at breast and maintain latch.  LATCH Score: 8   Lactation Tools Discussed/Used Tools: Pump;Flanges;Nipple Shields Nipple shield size: 16 (Mom aware this is not appropriate size and will get a larger NS on the way home to use. Mom had no complaints of discomfort with the NS during the visit.) Flange Size: 27  Interventions Interventions: Breast feeding basics reviewed;Breast compression;Assisted with latch;Adjust position;Skin to skin;Support pillows;DEBP;Breast massage;Position options;Hand pump;Hand express;Expressed milk;Education  Discharge Discharge Education: Engorgement and breast care;Warning signs for feeding baby;Outpatient recommendation Pump: Personal WIC Program: No   Mom had signs of engorgement and not able to latch infant on the left side. Family using pacifier they may cause nipple confusion. Infant was able to latch on the right without a NS according to the Mother. On arrival, right and left breast were dense more in the outer quadrants. LC applied moist heat and then tried using the manual pump for comfort. Not able to get any EBM with the manual pump. LC attempted to latch on breasts infant continued to pop off breast. 16 NS applied and  infant able to latch on both sides using curve tip and EBM to prime shield for 20 minutes total. Mom to apply ices 2-3 x a day for 20 minutes. Plan reviewed with RN Mittie Bodo. Signs, symptoms, treatment, prevention of engorgement education provided.  [facility] brochure provided with recommendations to follow up as outpatient if infant still has trouble latching on the left side.   Consult Status Consult Status: Follow-up Date: 07/28/20 Follow-up type: In-patient    Ariana Parks  Ariana Parks 07/27/2020, 2:54 PM

## 2020-07-27 NOTE — Progress Notes (Signed)
Postpartum Progress Note  Postpartum Day 3 s/p primary Cesarean section.  Subjective:  Patient reports no overnight events.  She reports well controlled pain, ambulating without difficulty, voiding spontaneously, tolerating PO.  She reports Positive flatus, Negative BM.  Vaginal bleeding is light.  Objective: Blood pressure 135/87, pulse 98, temperature 98 F (36.7 C), temperature source Oral, resp. rate 19, height 5\' 9"  (1.753 m), weight 134.7 kg, SpO2 100 %, unknown if currently breastfeeding.  Physical Exam:  General: alert and no distress Lochia: appropriate Uterine Fundus: firm Incision: dressing in place DVT Evaluation: No evidence of DVT seen on physical exam.  Recent Labs    07/25/20 0512  HGB 9.6*  HCT 30.6*    Assessment/Plan: . Postpartum Day 3, s/p C-section . Continue ambulation . Lactation following . Plans to take Fe at home . Doing well, encouraged to ambulate. Taking stool softener, continue routine postpartum care. Anticipate discharge today or tomorrow.   LOS: 3 days   Carlyon Shadow 07/27/2020, 7:54 AM

## 2020-07-27 NOTE — Lactation Note (Signed)
This note was copied from a baby's chart. Lactation Consultation Note Baby 60 hrs old. RN told LC that mom was engorged and uncomfortable. Mom has used DEBP collecting  23 ml. LC got mom pumping. LC massaged mom's breast. Collected 30 ml from one bottle and 2 oz. The rest the pumping. Praised mom. Attempted to latch baby d/t wanting to feed more. Baby refused pushed away and cried. It made mom cry. Encouraged mom not to give baby pacifier, can cause nipple confusion. Mom's breast felt much better after pumping.  Reviewed engorgement management and prevention. Mom to call for assistance.  Patient Name: Girl Debora Stockdale [id number] Date: 07/27/2020 Reason for consult: Follow-up assessment;Primapara;Term;Maternal endocrine disorder;Engorgement Age:12 hours  Maternal Data    Feeding Nipple Type: Nfant Slow Flow (purple)  LATCH Score Latch: Too sleepy or reluctant, no latch achieved, no sucking elicited.  Audible Swallowing: None  Type of Nipple: Everted at rest and after stimulation  Comfort (Breast/Nipple): Filling, red/small blisters or bruises, mild/mod discomfort  Hold (Positioning): Full assist, staff holds infant at breast  LATCH Score: 3   Lactation Tools Discussed/Used    Interventions Interventions: Breast feeding basics reviewed;Adjust position;Assisted with latch;Support pillows;Skin to skin;Position options;Breast massage;Expressed milk;Hand express;Breast compression;DEBP  Discharge Pump: DEBP  Consult Status Consult Status: Follow-up Date: 07/27/20 Follow-up type: In-patient    Chijioke Lasser, Elta Guadeloupe 07/27/2020, 2:21 AM

## 2020-07-28 ENCOUNTER — Telehealth: Payer: Self-pay

## 2020-07-28 NOTE — Telephone Encounter (Signed)
Transition Care Management Unsuccessful Follow-up Telephone Call  Date of discharge and from where:  07/27/20 from [facility]  Attempts:  1st Attempt  Reason for unsuccessful TCM follow-up call:  Left voice message    

## 2020-07-28 NOTE — Telephone Encounter (Signed)
Pt called back.   Transition Care Management Follow-up Telephone Call  Date of discharge and from where: 07/27/20 from [facility]  How have you been since you were released from the hospital? Pt stated that she is some pain but all in all everything is going well.    Any questions or concerns? No  Items Reviewed:  Did the pt receive and understand the discharge instructions provided? Yes   Medications obtained and verified? Yes   Other? No   Any new allergies since your discharge? No   Dietary orders reviewed? N/A  Do you have support at home? Yes   Functional Questionnaire: (I = Independent and D = Dependent) ADLs: I  Bathing/Dressing- I  Meal Prep- I  Eating- I  Maintaining continence- I  Transferring/Ambulation- I  Managing Meds- I   Follow up appointments reviewed:   [facility] f/u appt confirmed? No  Pt will Dr. Sherlynn Stalls office to scheduled.   Are transportation arrangements needed? No  If their condition worsens, is the pt aware to call PCP or go to the Emergency Dept.? Yes Was the patient provided with contact information for the PCP's office or ED? Yes Was to pt encouraged to call back with questions or concerns? Yes

## 2020-08-17 ENCOUNTER — Encounter: Payer: Self-pay | Admitting: Family Medicine

## 2020-09-09 ENCOUNTER — Telehealth: Payer: Self-pay

## 2020-09-09 NOTE — Telephone Encounter (Signed)
Left message advising on recommendations.

## 2020-09-09 NOTE — Telephone Encounter (Signed)
Would not recommend breast feeding while taking these.  She can resume breast feeding 8 hours after last dose of each of these medications.  She should pump and dump to keep supply up and prior to resuming breast feeding.

## 2020-09-09 NOTE — Telephone Encounter (Signed)
Exilda's mom called and states she had dental procedure and was given Vicodin and Ativan. She wanted to know if it was ok to take while breast feeding. If not can how soon after can she breast feed after taking the medication. She is in a lot of pain.

## 2020-09-20 ENCOUNTER — Encounter: Payer: Self-pay | Admitting: Family Medicine

## 2020-09-20 NOTE — Telephone Encounter (Signed)
Would mom be willing to bring her in to see Lovena Le this afternoon?

## 2020-09-27 ENCOUNTER — Ambulatory Visit: Payer: 59 | Admitting: Family Medicine

## 2020-09-27 ENCOUNTER — Ambulatory Visit (INDEPENDENT_AMBULATORY_CARE_PROVIDER_SITE_OTHER): Payer: Medicaid Other | Admitting: Family Medicine

## 2020-09-27 ENCOUNTER — Encounter: Payer: Self-pay | Admitting: Family Medicine

## 2020-09-27 ENCOUNTER — Other Ambulatory Visit: Payer: Self-pay

## 2020-09-27 DIAGNOSIS — F331 Major depressive disorder, recurrent, moderate: Secondary | ICD-10-CM

## 2020-09-27 MED ORDER — SERTRALINE HCL 100 MG PO TABS: 1.5000 | ORAL_TABLET | Freq: Every day | ORAL | 1 refills | Status: DC

## 2020-09-27 NOTE — Progress Notes (Signed)
Ariana Parks - 40 y.o. female MRN [id number]  Date of birth: 1980-12-20  Subjective Chief Complaint  Patient presents with  . Follow-up    HPI Ariana Parks is a 40 y.o. female here today for follow up of depression and anxiety.  She is 2 month post partum.  Overall she is doing ok.  Reports that some days are difficult.  She is currently taking sertraline 100mg  daily.  States that some days she feels like she could sit and cry but holds it together for her baby.  Feels like she could increase sertraline some.  Depression screen [facility] 2/9 09/27/2020 06/29/2020 05/23/2020  Decreased Interest 1 1 0  Down, Depressed, Hopeless 1 1 0  PHQ - 2 Score 2 2 0  Altered sleeping 1 2 -  Tired, decreased energy 2 2 -  Change in appetite 2 1 -  Feeling bad or failure about yourself  2 1 -  Trouble concentrating 1 2 -  Moving slowly or fidgety/restless 1 0 -  Suicidal thoughts 0 0 -  PHQ-9 Score 11 10 -  Difficult doing work/chores - Somewhat difficult -  Some recent data might be hidden   GAD 7 : Generalized Anxiety Score 09/27/2020 06/29/2020 04/19/2020 02/08/2020  Nervous, Anxious, on Edge - 2 2 2   Control/stop worrying 2 2 2 2   Worry too much - different things 2 2 3 2   Trouble relaxing 1 1 2 2   Restless 1 1 2 2   Easily annoyed or irritable 1 1 2 2   Afraid - awful might happen 1 1 2 2   Total GAD 7 Score - 10 15 14   Anxiety Difficulty - Somewhat difficult Somewhat difficult Somewhat difficult      Allergies  Allergen Reactions  . Citalopram     Other reaction(s): Bradycardia  . Celexa [Citalopram Hydrobromide] Other (See Comments)    Hospitalized due to low heart rate  . Ibuprofen Other (See Comments)    High doses (petechial bruising)  . Prednisone Other (See Comments)    Shaking uncontrollably   . Prilosec [Omeprazole] Other (See Comments)    Hospitalized due to low heart rate  . Wellbutrin [Bupropion] Other (See Comments)    Lows seizure threshold   . Flexeril  [Cyclobenzaprine] Other (See Comments)    Bizarre dreams/exacerbate depression    Past Medical History:  Diagnosis Date  . Abnormal Pap smear of cervix 2005   Mod dysplasia  . Depression   . Endometriosis   . GERD (gastroesophageal reflux disease)   . Gestational diabetes mellitus (GDM) affecting first pregnancy   . High cholesterol   . Nonocclusive mesenteric ischemia ([place]) 04/04/2018   Dx colonscopy Aug 2019 GAP  . OSA (obstructive sleep apnea) 07/05/2016  . Panic attack   . PCOS (polycystic ovarian syndrome)     Past Surgical History:  Procedure Laterality Date  . CESAREAN SECTION N/A 07/24/2020   Procedure: PRIMARY CESAREAN SECTION;  Surgeon: Everlene Farrier, MD;  Location: [place] [facility];  Service: Obstetrics;  Laterality: N/A;  . KNEE SURGERY    . LEEP    . TONSILLECTOMY    . WISDOM TOOTH EXTRACTION      Social History   Socioeconomic History  . Marital status: Single    Spouse name: Not on file  . Number of children: Not on file  . Years of education: Not on file  . Highest education level: Not on file  Occupational History  . Occupation: Designer, jewellery  Tobacco Use  .  Smoking status: Former Smoker    Packs/day: 0.50    Years: 15.00    Pack years: 7.50    Types: Cigarettes    Quit date: 05/25/2020    Years since quitting: 0.3  . Smokeless tobacco: Never Used  . Tobacco comment: using vapes  Vaping Use  . Vaping Use: Never used  Substance and Sexual Activity  . Alcohol use: Not Currently    Alcohol/week: 0.0 standard drinks  . Drug use: No  . Sexual activity: Yes    Partners: Male    Birth control/protection: None  Other Topics Concern  . Not on file  Social History Narrative  . Not on file   Social Determinants of Health   Financial Resource Strain: Not on file  Food Insecurity: No Food Insecurity  . Worried About Charity fundraiser in the Last Year: Never true  . Ran Out of Food in the Last Year: Never true  Transportation Needs: Not on file   Physical Activity: Not on file  Stress: Not on file  Social Connections: Not on file    Family History  Problem Relation Age of Onset  . Hypertension Mother   . Thyroid disease Mother   . GER disease Father   . Migraines Father   . Heart attack Other   . Thyroid disease Brother     Health Maintenance  Topic Date Due  . PAP SMEAR-Modifier  08/09/2007  . COVID-19 Vaccine (2 - Pfizer 3-dose series) 06/14/2020  . INFLUENZA VACCINE  01/23/2021  . COLONOSCOPY (Pts 45-66yrs Insurance coverage will need to be confirmed)  02/07/2023  . TETANUS/TDAP  05/03/2030  . Hepatitis C Screening  Completed  . HIV Screening  Completed  . HPV VACCINES  Aged Out     ----------------------------------------------------------------------------------------------------------------------------------------------------------------------------------------------------------------- Physical Exam BP (!) 114/53 (BP Location: Left Arm, Patient Position: Sitting, Cuff Size: Large)   Pulse 69   Temp (!) 97.2 F (36.2 C)   Ht 5\' 9"  (1.753 m)   Wt 290 lb 6.4 oz (131.7 kg)   SpO2 98%   BMI 42.88 kg/m   Physical Exam Constitutional:      Appearance: Normal appearance.  HENT:     Head: Normocephalic and atraumatic.  Neurological:     General: No focal deficit present.     Mental Status: She is alert.  Psychiatric:        Mood and Affect: Mood normal.        Behavior: Behavior normal.     ------------------------------------------------------------------------------------------------------------------------------------------------------------------------------------------------------------------- Assessment and Plan  MDD (major depressive disorder) Some increased anxiety and depression, worsened some in post-partum state. She will plan to make follow up with her therapist and we'll increase her sertraline to 150mg  daily. F/u in 3 months or sooner if needed.    Meds ordered this encounter   Medications  . sertraline (ZOLOFT) 100 MG tablet    Sig: Take 1.5 tablets (150 mg total) by mouth daily.    Dispense:  135 tablet    Refill:  1    Return in about 3 months (around 12/27/2020) for depression/anxiety.    This visit occurred during the SARS-CoV-2 public health emergency.  Safety protocols were in place, including screening questions prior to the visit, additional usage of staff PPE, and extensive cleaning of exam room while observing appropriate contact time as indicated for disinfecting solutions.

## 2020-09-27 NOTE — Patient Instructions (Signed)
Increase zoloft to 150mg  daily. Follow up with therapist.  See me again in 3 months.

## 2020-09-27 NOTE — Assessment & Plan Note (Signed)
Some increased anxiety and depression, worsened some in post-partum state. She will plan to make follow up with her therapist and we'll increase her sertraline to 150mg  daily. F/u in 3 months or sooner if needed.

## 2020-11-01 ENCOUNTER — Encounter: Payer: Self-pay | Admitting: Family Medicine

## 2020-11-28 DIAGNOSIS — G4733 Obstructive sleep apnea (adult) (pediatric): Secondary | ICD-10-CM | POA: Diagnosis not present

## 2020-12-08 ENCOUNTER — Ambulatory Visit: Payer: Medicaid Other | Admitting: Family Medicine

## 2020-12-08 ENCOUNTER — Other Ambulatory Visit: Payer: Self-pay

## 2020-12-08 ENCOUNTER — Encounter: Payer: Self-pay | Admitting: Family Medicine

## 2020-12-08 DIAGNOSIS — M62830 Muscle spasm of back: Secondary | ICD-10-CM | POA: Diagnosis not present

## 2020-12-08 MED ORDER — NAPROXEN 500 MG PO TABS: 500.0000 mg | ORAL_TABLET | Freq: Two times a day (BID) | ORAL | 0 refills | Status: DC

## 2020-12-08 NOTE — Progress Notes (Signed)
Ariana Parks - 40 y.o. female MRN [id number]  Date of birth: 03/22/1981  Subjective Chief Complaint  Patient presents with   Back Pain    HPI Ariana Parks is a 40 y.o. female here today with complaint of low back pain.  She feels like she is having spasm of her back.  She is getting pain and catching sensation with moving.  Pain is not radicular in nature.  She doesn't recall any specific injury or overuse.  She has not tried anything so far for treatment.  She has had similar issue before.  She is unable to take flexeril and would prefer to not take prednisone.  ROS:  A comprehensive ROS was completed and negative except as noted per HPI  Allergies  Allergen Reactions   Citalopram     Other reaction(s): Bradycardia   Celexa [Citalopram Hydrobromide] Other (See Comments)    Hospitalized due to low heart rate   Ibuprofen Other (See Comments)    High doses (petechial bruising)   Prednisone Other (See Comments)    Shaking uncontrollably    Prilosec [Omeprazole] Other (See Comments)    Hospitalized due to low heart rate   Wellbutrin [Bupropion] Other (See Comments)    Lows seizure threshold    Flexeril [Cyclobenzaprine] Other (See Comments)    Bizarre dreams/exacerbate depression    Past Medical History:  Diagnosis Date   Abnormal Pap smear of cervix 2005   Mod dysplasia   Depression    Endometriosis    GERD (gastroesophageal reflux disease)    Gestational diabetes mellitus (GDM) affecting first pregnancy    High cholesterol    Nonocclusive mesenteric ischemia ([place]) 04/04/2018   Dx colonscopy Aug 2019 GAP   OSA (obstructive sleep apnea) 07/05/2016   Panic attack    PCOS (polycystic ovarian syndrome)     Past Surgical History:  Procedure Laterality Date   CESAREAN SECTION N/A 07/24/2020   Procedure: PRIMARY CESAREAN SECTION;  Surgeon: Everlene Farrier, MD;  Location: [facility];  Service: Obstetrics;  Laterality: N/A;   KNEE SURGERY     LEEP     TONSILLECTOMY      WISDOM TOOTH EXTRACTION      Social History   Socioeconomic History   Marital status: Single    Spouse name: Not on file   Number of children: Not on file   Years of education: Not on file   Highest education level: Not on file  Occupational History   Occupation: Designer, jewellery  Tobacco Use   Smoking status: Former    Packs/day: 0.50    Years: 15.00    Pack years: 7.50    Types: Cigarettes    Quit date: 05/25/2020    Years since quitting: 0.5   Smokeless tobacco: Never   Tobacco comments:    using vapes  Vaping Use   Vaping Use: Never used  Substance and Sexual Activity   Alcohol use: Not Currently    Alcohol/week: 0.0 standard drinks   Drug use: No   Sexual activity: Yes    Partners: Male    Birth control/protection: None  Other Topics Concern   Not on file  Social History Narrative   Not on file   Social Determinants of Health   Financial Resource Strain: Not on file  Food Insecurity: No Food Insecurity   Worried About Running Out of Food in the Last Year: Never true   Ran Out of Food in the Last Year: Never true  Transportation Needs: Not on  file  Physical Activity: Not on file  Stress: Not on file  Social Connections: Not on file    Family History  Problem Relation Age of Onset   Hypertension Mother    Thyroid disease Mother    GER disease Father    Migraines Father    Heart attack Other    Thyroid disease Brother     Health Maintenance  Topic Date Due   PAP SMEAR-Modifier  08/09/2007   COVID-19 Vaccine (2 - Pfizer series) 06/14/2020   INFLUENZA VACCINE  01/23/2021   COLONOSCOPY (Pts 45-30yrs Insurance coverage will need to be confirmed)  02/07/2023   TETANUS/TDAP  05/03/2030   Hepatitis C Screening  Completed   HIV Screening  Completed   Pneumococcal Vaccine 68-59 Years old  Aged Out   HPV VACCINES  Aged Out      ----------------------------------------------------------------------------------------------------------------------------------------------------------------------------------------------------------------- Physical Exam BP 108/73 (BP Location: Left Arm, Patient Position: Sitting, Cuff Size: Large)   Pulse 66   Wt (!) 315 lb (142.9 kg)   SpO2 96%   BMI 46.52 kg/m   Physical Exam Constitutional:      Appearance: Normal appearance.  Cardiovascular:     Rate and Rhythm: Normal rate and regular rhythm.  Musculoskeletal:     Comments: ROM of back is limited in flexion with increased pain. TTP along bilateral paraspinal muscles of the lumbar spine. SLR is negative. Strength in LE normal.    Neurological:     Mental Status: She is alert.    ------------------------------------------------------------------------------------------------------------------------------------------------------------------------------------------------------------------- Assessment and Plan  Lumbar paraspinal muscle spasm Recommend using ice or heat as needed for comfort.  Handout for home exercises given  Unable to utilize most muscle relaxers due to breastfeeding.  Adding naproxen as needed.  Can add formal PT if not improving.     Meds ordered this encounter  Medications   naproxen (NAPROSYN) 500 MG tablet    Sig: Take 1 tablet (500 mg total) by mouth 2 (two) times daily with a meal for 10 days.    Dispense:  20 tablet    Refill:  0    No follow-ups on file.    This visit occurred during the SARS-CoV-2 public health emergency.  Safety protocols were in place, including screening questions prior to the visit, additional usage of staff PPE, and extensive cleaning of exam room while observing appropriate contact time as indicated for disinfecting solutions.

## 2020-12-08 NOTE — Assessment & Plan Note (Signed)
Recommend using ice or heat as needed for comfort.  Handout for home exercises given  Unable to utilize most muscle relaxers due to breastfeeding.  Adding naproxen as needed.  Can add formal PT if not improving.

## 2020-12-27 ENCOUNTER — Other Ambulatory Visit: Payer: Self-pay

## 2020-12-27 ENCOUNTER — Encounter: Payer: Self-pay | Admitting: Family Medicine

## 2020-12-27 ENCOUNTER — Ambulatory Visit (INDEPENDENT_AMBULATORY_CARE_PROVIDER_SITE_OTHER): Payer: Medicaid Other | Admitting: Family Medicine

## 2020-12-27 DIAGNOSIS — M62830 Muscle spasm of back: Secondary | ICD-10-CM | POA: Diagnosis not present

## 2020-12-27 DIAGNOSIS — F331 Major depressive disorder, recurrent, moderate: Secondary | ICD-10-CM | POA: Diagnosis not present

## 2020-12-27 MED ORDER — SERTRALINE HCL 100 MG PO TABS
200.0000 mg | ORAL_TABLET | Freq: Every day | ORAL | 1 refills | Status: DC
Start: 2020-12-27 — End: 2021-05-02

## 2020-12-27 MED ORDER — NAPROXEN 500 MG PO TABS: 500.0000 mg | ORAL_TABLET | Freq: Two times a day (BID) | ORAL | 3 refills | Status: DC

## 2020-12-27 NOTE — Assessment & Plan Note (Signed)
Increasing sertraline to 200mg  daily.   Return in about 4 months (around 04/29/2021) for anxiety/depression.

## 2020-12-27 NOTE — Patient Instructions (Signed)
Increase sertraline to 200mg  daily.  Continue naproxen as needed.

## 2020-12-27 NOTE — Progress Notes (Signed)
Ariana Parks - 40 y.o. female MRN [id number]  Date of birth: May 07, 1981  Subjective No chief complaint on file.   HPI Ariana Parks is a 40 y.o. female here today for follow up visit.  She continues to have some low back pain but this does improved with naproxn.  She requests refill of this.    She feels like sertraline remains helpful but feels like she needs to increase the dose on this.  Has 61 month old baby which creates some stress due to sleeping difficulty.  Depression screen [facility] 2/9 12/27/2020 12/08/2020 09/27/2020  Decreased Interest 1 2 1   Down, Depressed, Hopeless 2 1 1   PHQ - 2 Score 3 3 2   Altered sleeping 2 3 1   Tired, decreased energy 3 3 2   Change in appetite 2 2 2   Feeling bad or failure about yourself  1 1 2   Trouble concentrating 1 1 1   Moving slowly or fidgety/restless 1 1 1   Suicidal thoughts 0 1 0  PHQ-9 Score 13 15 11   Difficult doing work/chores Somewhat difficult Somewhat difficult -  Some recent data might be hidden   GAD 7 : Generalized Anxiety Score 12/27/2020 12/08/2020 09/27/2020 06/29/2020  Nervous, Anxious, on Edge 2 2 - 2  Control/stop worrying 2 3 2 2   Worry too much - different things 2 3 2 2   Trouble relaxing 1 1 1 1   Restless 1 1 1 1   Easily annoyed or irritable 1 1 1 1   Afraid - awful might happen 2 2 1 1   Total GAD 7 Score 11 13 - 10  Anxiety Difficulty Somewhat difficult Somewhat difficult - Somewhat difficult   ROS:  A comprehensive ROS was completed and negative except as noted per HPI   Allergies  Allergen Reactions   Citalopram     Other reaction(s): Bradycardia   Celexa [Citalopram Hydrobromide] Other (See Comments)    Hospitalized due to low heart rate   Ibuprofen Other (See Comments)    High doses (petechial bruising)   Prednisone Other (See Comments)    Shaking uncontrollably    Prilosec [Omeprazole] Other (See Comments)    Hospitalized due to low heart rate   Wellbutrin [Bupropion] Other (See Comments)    Lows seizure  threshold    Flexeril [Cyclobenzaprine] Other (See Comments)    Bizarre dreams/exacerbate depression    Past Medical History:  Diagnosis Date   Abnormal Pap smear of cervix 2005   Mod dysplasia   Depression    Endometriosis    GERD (gastroesophageal reflux disease)    Gestational diabetes mellitus (GDM) affecting first pregnancy    High cholesterol    Nonocclusive mesenteric ischemia ([place]) 04/04/2018   Dx colonscopy Aug 2019 GAP   OSA (obstructive sleep apnea) 07/05/2016   Panic attack    PCOS (polycystic ovarian syndrome)     Past Surgical History:  Procedure Laterality Date   CESAREAN SECTION N/A 07/24/2020   Procedure: PRIMARY CESAREAN SECTION;  Surgeon: Everlene Farrier, MD;  Location: [facility];  Service: Obstetrics;  Laterality: N/A;   KNEE SURGERY     LEEP     TONSILLECTOMY     WISDOM TOOTH EXTRACTION      Social History   Socioeconomic History   Marital status: Single    Spouse name: Not on file   Number of children: Not on file   Years of education: Not on file   Highest education level: Not on file  Occupational History   Occupation: Designer, jewellery  Tobacco Use   Smoking status: Former    Packs/day: 0.50    Years: 15.00    Pack years: 7.50    Types: Cigarettes    Quit date: 05/25/2020    Years since quitting: 0.5   Smokeless tobacco: Never   Tobacco comments:    using vapes  Vaping Use   Vaping Use: Never used  Substance and Sexual Activity   Alcohol use: Not Currently    Alcohol/week: 0.0 standard drinks   Drug use: No   Sexual activity: Yes    Partners: Male    Birth control/protection: None  Other Topics Concern   Not on file  Social History Narrative   Not on file   Social Determinants of Health   Financial Resource Strain: Not on file  Food Insecurity: No Food Insecurity   Worried About Charity fundraiser in the Last Year: Never true   Ran Out of Food in the Last Year: Never true  Transportation Needs: Not on file  Physical  Activity: Not on file  Stress: Not on file  Social Connections: Not on file    Family History  Problem Relation Age of Onset   Hypertension Mother    Thyroid disease Mother    GER disease Father    Migraines Father    Heart attack Other    Thyroid disease Brother     Health Maintenance  Topic Date Due   PAP SMEAR-Modifier  08/09/2007   COVID-19 Vaccine (2 - Pfizer series) 06/14/2020   INFLUENZA VACCINE  01/23/2021   COLONOSCOPY (Pts 45-64yrs Insurance coverage will need to be confirmed)  02/07/2023   TETANUS/TDAP  05/03/2030   Hepatitis C Screening  Completed   HIV Screening  Completed   Pneumococcal Vaccine 48-76 Years old  Aged Out   HPV VACCINES  Aged Out     ----------------------------------------------------------------------------------------------------------------------------------------------------------------------------------------------------------------- Physical Exam BP 111/70   Pulse 73   Ht 5\' 9"  (1.753 m)   Wt (!) 317 lb (143.8 kg)   BMI 46.81 kg/m   Physical Exam Constitutional:      Appearance: Normal appearance.  HENT:     Head: Normocephalic and atraumatic.  Eyes:     General: No scleral icterus. Skin:    General: Skin is warm and dry.  Neurological:     General: No focal deficit present.     Mental Status: She is alert.  Psychiatric:        Mood and Affect: Mood normal.        Behavior: Behavior normal.    ------------------------------------------------------------------------------------------------------------------------------------------------------------------------------------------------------------------- Assessment and Plan  Lumbar paraspinal muscle spasm Naproxen renewed.  She will continue as needed.   MDD (major depressive disorder) Increasing sertraline to 200mg  daily.   Return in about 4 months (around 04/29/2021) for anxiety/depression.   Meds ordered this encounter  Medications   naproxen (NAPROSYN) 500 MG  tablet    Sig: Take 1 tablet (500 mg total) by mouth 2 (two) times daily with a meal for 10 days.    Dispense:  30 tablet    Refill:  3   sertraline (ZOLOFT) 100 MG tablet    Sig: Take 2 tablets (200 mg total) by mouth daily.    Dispense:  180 tablet    Refill:  1    Return in about 4 months (around 04/29/2021) for anxiety/depression.    This visit occurred during the SARS-CoV-2 public health emergency.  Safety protocols were in place, including screening questions prior to the visit, additional usage  of staff PPE, and extensive cleaning of exam room while observing appropriate contact time as indicated for disinfecting solutions.

## 2020-12-27 NOTE — Assessment & Plan Note (Signed)
Naproxen renewed.  She will continue as needed.

## 2021-01-10 ENCOUNTER — Encounter: Payer: Self-pay | Admitting: Family Medicine

## 2021-01-13 ENCOUNTER — Encounter: Payer: Self-pay | Admitting: Family Medicine

## 2021-01-13 NOTE — Telephone Encounter (Signed)
Patient scheduled.

## 2021-01-13 NOTE — Telephone Encounter (Signed)
Recommend appt the first of next week or take to the ED if symptoms worsen.

## 2021-01-31 ENCOUNTER — Encounter: Payer: Self-pay | Admitting: Family Medicine

## 2021-03-24 ENCOUNTER — Encounter: Payer: Self-pay | Admitting: Family Medicine

## 2021-03-24 NOTE — Telephone Encounter (Signed)
Offered appt for Monday 03/27/2021 however, pt's mother states that pt has an appointment on 03/28/2021.  Mother states that she will keep this appt and have f/u at that time.   Charyl Bigger, CMA

## 2021-04-04 ENCOUNTER — Encounter: Payer: Self-pay | Admitting: Family Medicine

## 2021-04-06 ENCOUNTER — Encounter: Payer: Self-pay | Admitting: Family Medicine

## 2021-04-12 DIAGNOSIS — M170 Bilateral primary osteoarthritis of knee: Secondary | ICD-10-CM | POA: Diagnosis not present

## 2021-04-18 ENCOUNTER — Other Ambulatory Visit: Payer: Self-pay | Admitting: Family Medicine

## 2021-04-18 NOTE — Telephone Encounter (Signed)
[facility] is requesting med refill for naproxen. Rx not listed in active med list.

## 2021-04-20 ENCOUNTER — Encounter: Payer: Self-pay | Admitting: Family Medicine

## 2021-05-01 ENCOUNTER — Ambulatory Visit: Payer: Medicaid Other | Admitting: Family Medicine

## 2021-05-02 ENCOUNTER — Ambulatory Visit (INDEPENDENT_AMBULATORY_CARE_PROVIDER_SITE_OTHER): Payer: Medicaid Other | Admitting: Family Medicine

## 2021-05-02 ENCOUNTER — Other Ambulatory Visit: Payer: Self-pay

## 2021-05-02 ENCOUNTER — Encounter: Payer: Self-pay | Admitting: Family Medicine

## 2021-05-02 VITALS — BP 104/67 | HR 68 | Ht 69.0 in | Wt 340.0 lb

## 2021-05-02 DIAGNOSIS — G8929 Other chronic pain: Secondary | ICD-10-CM

## 2021-05-02 DIAGNOSIS — F419 Anxiety disorder, unspecified: Secondary | ICD-10-CM | POA: Diagnosis not present

## 2021-05-02 DIAGNOSIS — O24419 Gestational diabetes mellitus in pregnancy, unspecified control: Secondary | ICD-10-CM | POA: Diagnosis not present

## 2021-05-02 DIAGNOSIS — E282 Polycystic ovarian syndrome: Secondary | ICD-10-CM

## 2021-05-02 DIAGNOSIS — M545 Low back pain, unspecified: Secondary | ICD-10-CM | POA: Diagnosis not present

## 2021-05-02 DIAGNOSIS — R635 Abnormal weight gain: Secondary | ICD-10-CM

## 2021-05-02 MED ORDER — SERTRALINE HCL 100 MG PO TABS: 200.0000 mg | ORAL_TABLET | Freq: Every day | ORAL | 1 refills | Status: DC

## 2021-05-02 NOTE — Assessment & Plan Note (Signed)
She is doing well with sertraline at current strength.  We will continue at current strength.

## 2021-05-02 NOTE — Assessment & Plan Note (Signed)
Updated x-rays of lumbar spine ordered.  Given handout for back rehab and core strengthening exercises.  Weight loss recommended.

## 2021-05-02 NOTE — Assessment & Plan Note (Signed)
Updating labs today. Orders Placed This Encounter  Procedures  . DG Lumbar Spine Complete    Standing Status:   Future    Standing Expiration Date:   05/02/2022    Order Specific Question:   Reason for Exam (SYMPTOM  OR DIAGNOSIS REQUIRED)    Answer:   Chronic low back pain    Order Specific Question:   Is patient pregnant?    Answer:   No    Order Specific Question:   Preferred imaging location?    Answer:   [facility]  . COMPLETE METABOLIC PANEL WITH GFR  . CBC with Differential  . TSH  . HgB A1c

## 2021-05-02 NOTE — Progress Notes (Addendum)
Ariana Parks - 40 y.o. female MRN [id number]  Date of birth: 1981-01-07  Subjective Chief Complaint  Patient presents with   Back Pain   Anxiety    HPI Ariana Parks is a 40 year old female here today for follow-up visit.  She has continued complaint of low back pain.  She has tried naproxen without much improvement.  Pain does not radiate into buttocks or lower extremities.  She denies any lower extremity weakness.  She has had weight gain over the past year.  She does not exercise regularly.  She does feel like anxiety continues to remain well managed with sertraline at current strength.  She denies side effects with this.  ROS:  A comprehensive ROS was completed and negative except as noted per HPI  Allergies  Allergen Reactions   Citalopram     Other reaction(s): Bradycardia   Celexa [Citalopram Hydrobromide] Other (See Comments)    Hospitalized due to low heart rate   Ibuprofen Other (See Comments)    High doses (petechial bruising)   Prednisone Other (See Comments)    Shaking uncontrollably    Prilosec [Omeprazole] Other (See Comments)    Hospitalized due to low heart rate   Wellbutrin [Bupropion] Other (See Comments)    Lows seizure threshold    Flexeril [Cyclobenzaprine] Other (See Comments)    Bizarre dreams/exacerbate depression    Past Medical History:  Diagnosis Date   Abnormal Pap smear of cervix 2005   Mod dysplasia   Depression    Endometriosis    GERD (gastroesophageal reflux disease)    Gestational diabetes mellitus (GDM) affecting first pregnancy    High cholesterol    Nonocclusive mesenteric ischemia ([place]) 04/04/2018   Dx colonscopy Aug 2019 GAP   OSA (obstructive sleep apnea) 07/05/2016   Panic attack    PCOS (polycystic ovarian syndrome)     Past Surgical History:  Procedure Laterality Date   CESAREAN SECTION N/A 07/24/2020   Procedure: PRIMARY CESAREAN SECTION;  Surgeon: Everlene Farrier, MD;  Location: [facility];  Service: Obstetrics;   Laterality: N/A;   KNEE SURGERY     LEEP     TONSILLECTOMY     WISDOM TOOTH EXTRACTION      Social History   Socioeconomic History   Marital status: Single    Spouse name: Not on file   Number of children: Not on file   Years of education: Not on file   Highest education level: Not on file  Occupational History   Occupation: Designer, jewellery  Tobacco Use   Smoking status: Former    Packs/day: 0.50    Years: 15.00    Pack years: 7.50    Types: Cigarettes    Quit date: 05/25/2020    Years since quitting: 0.9   Smokeless tobacco: Never   Tobacco comments:    using vapes  Vaping Use   Vaping Use: Never used  Substance and Sexual Activity   Alcohol use: Not Currently    Alcohol/week: 0.0 standard drinks   Drug use: No   Sexual activity: Yes    Partners: Male    Birth control/protection: None  Other Topics Concern   Not on file  Social History Narrative   Not on file   Social Determinants of Health   Financial Resource Strain: Not on file  Food Insecurity: No Food Insecurity   Worried About Running Out of Food in the Last Year: Never true   Ran Out of Food in the Last Year: Never true  Transportation  Needs: Not on file  Physical Activity: Not on file  Stress: Not on file  Social Connections: Not on file    Family History  Problem Relation Age of Onset   Hypertension Mother    Thyroid disease Mother    GER disease Father    Migraines Father    Heart attack Other    Thyroid disease Brother     Health Maintenance  Topic Date Due   COVID-19 Vaccine (2 - Pfizer series) 06/14/2020   PAP SMEAR-Modifier  07/10/2021 (Originally 08/09/2007)   COLONOSCOPY (Pts 45-54yrs Insurance coverage will need to be confirmed)  02/07/2023   TETANUS/TDAP  05/03/2030   INFLUENZA VACCINE  Completed   Hepatitis C Screening  Completed   HIV Screening  Completed   Pneumococcal Vaccine 70-69 Years old  Aged Out   HPV VACCINES  Aged Out      ----------------------------------------------------------------------------------------------------------------------------------------------------------------------------------------------------------------- Physical Exam BP 104/67 (BP Location: Right Arm, Patient Position: Sitting, Cuff Size: Large)   Pulse 68   Ht 5\' 9"  (1.753 m)   Wt (!) 340 lb (154.2 kg)   SpO2 97%   BMI 50.21 kg/m   Physical Exam Constitutional:      Appearance: Normal appearance.  HENT:     Head: Normocephalic and atraumatic.  Eyes:     General: No scleral icterus. Cardiovascular:     Rate and Rhythm: Normal rate and regular rhythm.  Pulmonary:     Effort: Pulmonary effort is normal.     Breath sounds: Normal breath sounds.  Musculoskeletal:     Cervical back: Neck supple.  Neurological:     General: No focal deficit present.     Mental Status: She is alert.  Psychiatric:        Mood and Affect: Mood normal.        Behavior: Behavior normal.    ------------------------------------------------------------------------------------------------------------------------------------------------------------------------------------------------------------------- Assessment and Plan  Anxiety She is doing well with sertraline at current strength.  We will continue at current strength.  Abnormal weight gain Updating labs today. Orders Placed This Encounter  Procedures   DG Lumbar Spine Complete    Standing Status:   Future    Standing Expiration Date:   05/02/2022    Order Specific Question:   Reason for Exam (SYMPTOM  OR DIAGNOSIS REQUIRED)    Answer:   Chronic low back pain    Order Specific Question:   Is patient pregnant?    Answer:   No    Order Specific Question:   Preferred imaging location?    Answer:   [facility]   COMPLETE METABOLIC PANEL WITH GFR   CBC with Differential   TSH   HgB A1c     Chronic bilateral low back pain without sciatica Updated x-rays of lumbar  spine ordered.  Given handout for back rehab and core strengthening exercises.  Weight loss recommended.   Meds ordered this encounter  Medications   sertraline (ZOLOFT) 100 MG tablet    Sig: Take 2 tablets (200 mg total) by mouth daily.    Dispense:  180 tablet    Refill:  1    Return in about 8 weeks (around 06/27/2021) for Back pain.    This visit occurred during the SARS-CoV-2 public health emergency.  Safety protocols were in place, including screening questions prior to the visit, additional usage of staff PPE, and extensive cleaning of exam room while observing appropriate contact time as indicated for disinfecting solutions.

## 2021-05-05 ENCOUNTER — Other Ambulatory Visit: Payer: Self-pay

## 2021-05-05 ENCOUNTER — Ambulatory Visit (INDEPENDENT_AMBULATORY_CARE_PROVIDER_SITE_OTHER): Payer: Medicaid Other

## 2021-05-05 DIAGNOSIS — G8929 Other chronic pain: Secondary | ICD-10-CM

## 2021-05-05 DIAGNOSIS — O24419 Gestational diabetes mellitus in pregnancy, unspecified control: Secondary | ICD-10-CM | POA: Diagnosis not present

## 2021-05-05 DIAGNOSIS — R635 Abnormal weight gain: Secondary | ICD-10-CM | POA: Diagnosis not present

## 2021-05-05 DIAGNOSIS — M545 Low back pain, unspecified: Secondary | ICD-10-CM

## 2021-05-05 DIAGNOSIS — E282 Polycystic ovarian syndrome: Secondary | ICD-10-CM | POA: Diagnosis not present

## 2021-05-06 LAB — CBC WITH DIFFERENTIAL/PLATELET
Absolute Monocytes: 870 cells/uL (ref 200–950)
Basophils Absolute: 57 cells/uL (ref 0–200)
Basophils Relative: 0.5 %
Eosinophils Absolute: 136 cells/uL (ref 15–500)
Eosinophils Relative: 1.2 %
HCT: 41.6 % (ref 35.0–45.0)
Hemoglobin: 13.9 g/dL (ref 11.7–15.5)
Lymphs Abs: 3028 cells/uL (ref 850–3900)
MCH: 31.4 pg (ref 27.0–33.0)
MCHC: 33.4 g/dL (ref 32.0–36.0)
MCV: 94.1 fL (ref 80.0–100.0)
MPV: 10.6 fL (ref 7.5–12.5)
Monocytes Relative: 7.7 %
Neutro Abs: 7209 cells/uL (ref 1500–7800)
Neutrophils Relative %: 63.8 %
Platelets: 268 10*3/uL (ref 140–400)
RBC: 4.42 10*6/uL (ref 3.80–5.10)
RDW: 12.6 % (ref 11.0–15.0)
Total Lymphocyte: 26.8 %
WBC: 11.3 10*3/uL — ABNORMAL HIGH (ref 3.8–10.8)

## 2021-05-06 LAB — HEMOGLOBIN A1C
Hgb A1c MFr Bld: 5.7 % of total Hgb — ABNORMAL HIGH (ref <5.7)
Mean Plasma Glucose: 117 mg/dL
eAG (mmol/L): 6.5 mmol/L

## 2021-05-06 LAB — COMPLETE METABOLIC PANEL WITH GFR
AG Ratio: 1.9 (calc) (ref 1.0–2.5)
ALT: 28 U/L (ref 6–29)
AST: 20 U/L (ref 10–30)
Albumin: 4.4 g/dL (ref 3.6–5.1)
Alkaline phosphatase (APISO): 77 U/L (ref 31–125)
BUN: 17 mg/dL (ref 7–25)
CO2: 28 mmol/L (ref 20–32)
Calcium: 9.4 mg/dL (ref 8.6–10.2)
Chloride: 103 mmol/L (ref 98–110)
Creat: 0.53 mg/dL (ref 0.50–0.97)
Globulin: 2.3 g/dL (calc) (ref 1.9–3.7)
Glucose, Bld: 89 mg/dL (ref 65–99)
Potassium: 4.5 mmol/L (ref 3.5–5.3)
Sodium: 139 mmol/L (ref 135–146)
Total Bilirubin: 0.4 mg/dL (ref 0.2–1.2)
Total Protein: 6.7 g/dL (ref 6.1–8.1)
eGFR: 121 mL/min/{1.73_m2} (ref >=60)

## 2021-05-06 LAB — TSH: TSH: 1.34 mIU/L

## 2021-05-08 ENCOUNTER — Encounter: Payer: Self-pay | Admitting: Family Medicine

## 2021-05-09 ENCOUNTER — Encounter: Payer: Self-pay | Admitting: Family Medicine

## 2021-05-10 ENCOUNTER — Encounter: Payer: Self-pay | Admitting: Family Medicine

## 2021-05-10 DIAGNOSIS — G8929 Other chronic pain: Secondary | ICD-10-CM

## 2021-05-11 NOTE — Telephone Encounter (Signed)
Yes, please.

## 2021-06-05 ENCOUNTER — Other Ambulatory Visit: Payer: Self-pay

## 2021-06-05 ENCOUNTER — Encounter: Payer: Self-pay | Admitting: Physical Therapy

## 2021-06-05 ENCOUNTER — Ambulatory Visit: Payer: Medicaid Other | Attending: Family Medicine | Admitting: Physical Therapy

## 2021-06-05 DIAGNOSIS — M545 Low back pain, unspecified: Secondary | ICD-10-CM | POA: Insufficient documentation

## 2021-06-05 DIAGNOSIS — M6281 Muscle weakness (generalized): Secondary | ICD-10-CM

## 2021-06-05 DIAGNOSIS — R262 Difficulty in walking, not elsewhere classified: Secondary | ICD-10-CM

## 2021-06-05 DIAGNOSIS — M5442 Lumbago with sciatica, left side: Secondary | ICD-10-CM | POA: Diagnosis not present

## 2021-06-05 DIAGNOSIS — R252 Cramp and spasm: Secondary | ICD-10-CM | POA: Diagnosis not present

## 2021-06-05 DIAGNOSIS — G8929 Other chronic pain: Secondary | ICD-10-CM

## 2021-06-05 DIAGNOSIS — M5441 Lumbago with sciatica, right side: Secondary | ICD-10-CM | POA: Insufficient documentation

## 2021-06-05 NOTE — Patient Instructions (Signed)
Access Code: [email or web address] URL: [email or web address] Date: 06/05/2021 Prepared by: Glenetta Hew  Exercises Seated Posterior Pelvic Tilt - 1 x daily - 7 x weekly - 2 sets - 10 reps Seated Hamstring Stretch - 1 x daily - 7 x weekly - 1 sets - 3 reps - 30 sec hold Seated Trunk Rotation - Arms Crossed - 1 x daily - 7 x weekly - 2 sets - 10 reps Seated Long Arc Quad - 1 x daily - 7 x weekly - 2 sets - 10 reps

## 2021-06-05 NOTE — Therapy (Signed)
[facility] [address]  [place], [place], [address] Phone: [phone]   Fax:  [phone]  Physical Therapy Evaluation  Patient Details  Name: Ariana Parks MRN: [id number] Date of Birth: March 24, 1981 Referring Provider (PT): Luetta Nutting DO   Encounter Date: 06/05/2021   PT End of Session - 06/05/21 1805     Visit Number 1    Number of Visits 12    Date for PT Re-Evaluation 07/17/21    Authorization Type [employer]    PT Start Time 6967    PT Stop Time 8938    PT Time Calculation (min) 40 min    Activity Tolerance Patient limited by pain    Behavior During Therapy [facility] for tasks assessed/performed             Past Medical History:  Diagnosis Date   Abnormal Pap smear of cervix 2005   Mod dysplasia   Depression    Endometriosis    GERD (gastroesophageal reflux disease)    Gestational diabetes mellitus (GDM) affecting first pregnancy    High cholesterol    Nonocclusive mesenteric ischemia ([place]) 04/04/2018   Dx colonscopy Aug 2019 GAP   OSA (obstructive sleep apnea) 07/05/2016   Panic attack    PCOS (polycystic ovarian syndrome)     Past Surgical History:  Procedure Laterality Date   CESAREAN SECTION N/A 07/24/2020   Procedure: PRIMARY CESAREAN SECTION;  Surgeon: Everlene Farrier, MD;  Location: [facility];  Service: Obstetrics;  Laterality: N/A;   KNEE SURGERY     LEEP     TONSILLECTOMY     WISDOM TOOTH EXTRACTION      There were no vitals filed for this visit.    Subjective Assessment - 06/05/21 1455     Subjective Patient reports that she has had back pain since her 20's, worsened when threw it out when worked for [employer], able to deal with it but the pain increased significantly after she had her child in January.  Bending over and picking up her child really hurts her back .    Pertinent History history chronic LBP, C-section, GERD, OSA, PCOS, anxiety    Limitations Lifting     How long can you sit comfortably? 10-15 min with good posture.    How long can you stand comfortably? 10-15 min    How long can you walk comfortably? 10 min    Diagnostic tests lumbar IMPRESSION:  No recent fracture is seen. Degenerative changes are noted,  particularly prominent at L1-L2 level    Patient Stated Goals help with the pain so I can walk and care for baby, exercise to lose some weight so I can take care of my baby.    Currently in Pain? Yes    Pain Score 4    worst 8-9/10   Pain Location Back    Pain Orientation Lower    Pain Descriptors / Indicators Squeezing;Stabbing;Spasm    Pain Type Chronic pain    Pain Radiating Towards down both legs to knees, numbness R foot ball of toe    Pain Onset More than a month ago    Pain Frequency Constant    Aggravating Factors  bending, lifting    Pain Relieving Factors hot showers, lidocaine patches    Effect of Pain on Daily Activities difficulty caring for daughter, hurts to pick up                [facility] PT  Assessment - 06/05/21 0001       Assessment   Medical Diagnosis M54.50,G89.29 (ICD-10-CM) - Chronic bilateral low back pain without sciatica    Referring Provider (PT) Luetta Nutting DO    Onset Date/Surgical Date --   worsened January 2021 after childbirth   Hand Dominance Left    Next MD Visit January 2023    Prior Therapy no      Precautions   Precautions None      Restrictions   Weight Bearing Restrictions No      Balance Screen   Has the patient fallen in the past 6 months No    Has the patient had a decrease in activity level because of a fear of falling?  No    Is the patient reluctant to leave their home because of a fear of falling?  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, lifting, bending      Observation/Other Assessments   Focus on Therapeutic Outcomes (FOTO)  32   48 predicted after 11 visits     ROM / Strength   AROM / PROM / Strength PROM;Strength;AROM      AROM   Overall AROM  Deficits    Overall AROM Comments increased pain all directions except flexion      PROM   Overall PROM  Deficits    Overall PROM Comments increased pain with all hip movements      Strength   Overall Strength Within functional limits for tasks performed    Overall Strength Comments 5/5 bil LE strength except L hip flexion 4+/5, but decreased core strength      Flexibility   Soft Tissue Assessment /Muscle Length yes    Hamstrings tightness bil 60 deg      Palpation   Palpation comment noted increased pain with all movements, tested in seated position today due to pain, pain primarily lower thoracic and lumbar vertebrae and paraspinals.  Reports some tenderness over bil SIJ.                        Objective measurements completed on examination: See above findings.       [facility] PT Treatment/Exercise - 06/05/21 0001       Exercises   Exercises Lumbar      Lumbar Exercises: Stretches   Active Hamstring Stretch Limitations demo in sitting    Lower Trunk Rotation Limitations increased pain    Pelvic Tilt 5 reps    Pelvic Tilt Limitations increased pain in prone, tolerated well in sitting      Lumbar Exercises: Seated   LAQ on Chair Limitations demo, side of preference if needed    Other Seated Lumbar Exercises trunk rotation in sitting                     PT Education - 06/05/21 1804     Education Details education on findings, POC, and initial HEP.  Due to allodynia and poor tolerance to positioning, recommended aquatic therapy.  Person(s) Educated Patient    Methods  Explanation;Demonstration;Handout;Verbal cues    Comprehension Verbalized understanding;Returned demonstration              PT Short Term Goals - 06/05/21 1821       PT SHORT TERM GOAL #1   Title Independent with initial HEP    Time 2    Period Weeks    Status New    Target Date 06/19/21               PT Long Term Goals - 06/05/21 1821       PT LONG TERM GOAL #1   Title Pt. will be independent with progressed HEP for core strengthening to improve outcomes.    Time 6    Period Weeks    Status New    Target Date 07/17/21      PT LONG TERM GOAL #2   Title Pt. will be able to tolerate standing/walking for 20 minutes without increased pain/numbness.    Baseline 10-15 minutes with bil LE numbness and increased pain.    Time 6    Period Weeks    Status New    Target Date 07/17/21      PT LONG TERM GOAL #3   Title Patient will be able to lift 20lbs without increased LBP to care for daughter.    Baseline increased LBP with lifting    Time 6    Period Weeks    Status New    Target Date 07/17/21      PT LONG TERM GOAL #4   Title Pt. will improve FOTO to 48% to demonstrate improved physical functioning.    Baseline 32%    Time 6    Period Weeks    Status New    Target Date 07/17/21      PT LONG TERM GOAL #5   Title Pt. will report 50% improvement in LBP and radicular symptoms.    Time 6    Period Weeks    Status New    Target Date 07/17/21                    Plan - 06/05/21 1806     Clinical Impression Statement Ariana Parks is a 40 year old female referred for chronic LBP exacerbated significantly following C-section in January 2022.  She reports increased pain in low back especially with lifting, but reports radicular symptoms and numbness in bil LE with standing and walking more than ten minutes.  Today she demonstrated increased pain with prone positioning and increased pain with all movements in this position.  Tolerated sitting exercises  better.  Based on response to exercise today and pain level, discussed that she would be a good candidate for aquatic therapy, as unweighting and support of water would help decrease pain and allow better participation with exercise.  She was agreement in this plan and does not have difficulty with transportation to [place].  She would benefit from skilled physical therapy for core strengthening to decrease pain and allow ability to care for daughter.    Personal Factors and Comorbidities Comorbidity 3+    Comorbidities chronic LBP, C-section, PCOS, obesity, GERD, anxiety/depression, bil knee surgery (lateral release)    Examination-Activity Limitations Bed Mobility;Bend;Caring for Others;Carry;Locomotion Level;Stairs;Stand;Squat;Sit;Lift;Dressing    Examination-Participation Restrictions Interpersonal Relationship;Cleaning;Community Activity;Driving    Stability/Clinical Decision Making Evolving/Moderate complexity    Clinical Decision Making Moderate    Rehab Potential Good    PT Frequency 2x /  week    PT Duration 6 weeks    PT Treatment/Interventions Aquatic Therapy;ADLs/Self Care Home Management;Cryotherapy;Electrical Stimulation;Moist Heat;Ultrasound;Gait training;Stair training;Functional mobility training;Therapeutic activities;Therapeutic exercise;Balance training;Neuromuscular re-education;Patient/family education;Manual techniques;Passive range of motion;Dry needling;Taping;Spinal Manipulations;Joint Manipulations    PT Next Visit Plan aquatic therapy for core strengthening.    PT Home Exercise Plan Access Code: [email or web address]    Consulted and Agree with Plan of Care Patient             Patient will benefit from skilled therapeutic intervention in order to improve the following deficits and impairments:  Decreased endurance, Decreased mobility, Difficulty walking, Increased muscle spasms, Impaired sensation, Decreased range of motion, Impaired perceived functional ability, Decreased  activity tolerance, Decreased strength, Increased fascial restricitons, Impaired flexibility, Postural dysfunction, Pain  Visit Diagnosis: Chronic midline low back pain with bilateral sciatica  Cramp and spasm  Difficulty in walking, not elsewhere classified  Muscle weakness (generalized)     Problem List Patient Active Problem List   Diagnosis Date Noted   Abnormal weight gain 05/02/2021   Chronic bilateral low back pain without sciatica 05/02/2021   Lumbar paraspinal muscle spasm 12/08/2020   COVID-19 06/16/2020   Gestational diabetes mellitus (GDM), antepartum 05/23/2020   Pain, dental 04/19/2020   GERD (gastroesophageal reflux disease) 03/24/2020   Nonocclusive mesenteric ischemia ([place]) 04/04/2018   DUB (dysfunctional uterine bleeding) 03/14/2017   Prediabetes 02/14/2017   OSA (obstructive sleep apnea) 07/05/2016   Morbid obesity ([place]) 07/05/2016   PCOS (polycystic ovarian syndrome) 05/31/2015   Abnormal EEG 01/02/2012   ADD (attention deficit disorder) 01/02/2012   Anxiety 01/02/2012   MDD (major depressive disorder) 01/02/2012   Current tobacco use 01/02/2012    Rennie Natter, PT, DPT  06/05/2021, 6:29 PM  [facility] [address]  [place], [place], [address] Phone: [phone]   Fax:  [phone]  Name: Ariana Parks MRN: [id number] Date of Birth: 09/08/1980

## 2021-06-13 ENCOUNTER — Encounter: Payer: Self-pay | Admitting: Family Medicine

## 2021-06-13 MED ORDER — NIRMATRELVIR/RITONAVIR (PAXLOVID)TABLET: 3.0000 | ORAL_TABLET | Freq: Two times a day (BID) | ORAL | 0 refills | Status: AC

## 2021-06-14 ENCOUNTER — Ambulatory Visit: Payer: Medicaid Other | Admitting: Physical Therapy

## 2021-06-15 ENCOUNTER — Encounter: Payer: Self-pay | Admitting: Family Medicine

## 2021-06-22 ENCOUNTER — Encounter: Payer: Self-pay | Admitting: Family Medicine

## 2021-06-29 ENCOUNTER — Encounter (HOSPITAL_BASED_OUTPATIENT_CLINIC_OR_DEPARTMENT_OTHER): Payer: Self-pay | Admitting: Physical Therapy

## 2021-06-29 ENCOUNTER — Other Ambulatory Visit: Payer: Self-pay

## 2021-06-29 ENCOUNTER — Ambulatory Visit (HOSPITAL_BASED_OUTPATIENT_CLINIC_OR_DEPARTMENT_OTHER): Payer: Medicaid Other | Attending: Family Medicine | Admitting: Physical Therapy

## 2021-06-29 DIAGNOSIS — M545 Low back pain, unspecified: Secondary | ICD-10-CM | POA: Insufficient documentation

## 2021-06-29 DIAGNOSIS — G8929 Other chronic pain: Secondary | ICD-10-CM | POA: Diagnosis not present

## 2021-06-29 DIAGNOSIS — R262 Difficulty in walking, not elsewhere classified: Secondary | ICD-10-CM

## 2021-06-29 DIAGNOSIS — M6281 Muscle weakness (generalized): Secondary | ICD-10-CM

## 2021-06-29 DIAGNOSIS — R252 Cramp and spasm: Secondary | ICD-10-CM

## 2021-06-29 NOTE — Therapy (Signed)
[facility] [address] [place], [place], [address] Phone: [phone]   Fax:  [phone]  Physical Therapy Treatment  Patient Details  Name: Ariana Parks MRN: [id number] Date of Birth: 1980/12/22 Referring Provider (PT): Luetta Nutting DO   Encounter Date: 06/29/2021   PT End of Session - 06/29/21 1408     Visit Number 2    Number of Visits 12    Date for PT Re-Evaluation 07/17/21    Authorization Type [employer]    PT Start Time 1324    PT Stop Time 4010    PT Time Calculation (min) 41 min    Activity Tolerance Patient limited by pain    Behavior During Therapy [facility] for tasks assessed/performed             Past Medical History:  Diagnosis Date   Abnormal Pap smear of cervix 2005   Mod dysplasia   Depression    Endometriosis    GERD (gastroesophageal reflux disease)    Gestational diabetes mellitus (GDM) affecting first pregnancy    High cholesterol    Nonocclusive mesenteric ischemia ([place]) 04/04/2018   Dx colonscopy Aug 2019 GAP   OSA (obstructive sleep apnea) 07/05/2016   Panic attack    PCOS (polycystic ovarian syndrome)     Past Surgical History:  Procedure Laterality Date   CESAREAN SECTION N/A 07/24/2020   Procedure: PRIMARY CESAREAN SECTION;  Surgeon: Everlene Farrier, MD;  Location: [facility];  Service: Obstetrics;  Laterality: N/A;   KNEE SURGERY     LEEP     TONSILLECTOMY     WISDOM TOOTH EXTRACTION      There were no vitals filed for this visit.   Subjective Assessment - 06/29/21 1418     Subjective 6/10 pain today.  No difficulty walking distance from parkinglot to pool    Pain Score 6     Pain Location Back    Pain Orientation Lower    Pain Descriptors / Indicators Squeezing;Stabbing;Spasm    Pain Type Chronic pain    Pain Onset More than a month ago    Pain Frequency Constant                                          PT Short Term Goals  - 06/05/21 1821       PT SHORT TERM GOAL #1   Title Independent with initial HEP    Time 2    Period Weeks    Status New    Target Date 06/19/21               PT Long Term Goals - 06/05/21 1821       PT LONG TERM GOAL #1   Title Pt. will be independent with progressed HEP for core strengthening to improve outcomes.    Time 6    Period Weeks    Status New    Target Date 07/17/21      PT LONG TERM GOAL #2   Title Pt. will be able to tolerate standing/walking for 20 minutes without increased pain/numbness.    Baseline 10-15 minutes with bil LE numbness and increased pain.    Time 6    Period Weeks    Status New    Target Date 07/17/21      PT LONG TERM GOAL #3   Title Patient will  be able to lift 20lbs without increased LBP to care for daughter.    Baseline increased LBP with lifting    Time 6    Period Weeks    Status New    Target Date 07/17/21      PT LONG TERM GOAL #4   Title Pt. will improve FOTO to 48% to demonstrate improved physical functioning.    Baseline 32%    Time 6    Period Weeks    Status New    Target Date 07/17/21      PT LONG TERM GOAL #5   Title Pt. will report 50% improvement in LBP and radicular symptoms.    Time 6    Period Weeks    Status New    Target Date 07/17/21            Pt seen for aquatic therapy today.  Treatment took place in water 3.25-4.8 ft in depth at the [facility] pool. Temp of water was 93.  Pt entered/exited the pool via stairs (step through pattern) independently with bilat rail.  Introduction to setting Warm up walking fwd, bkw and side stepping x 5 minutes Seated stretching gastroc and hamstring 3x20-25 sec LB stretch in pike position at steps hip hiking R/L Buoyancy resisted kick board push down 2x15.  Instruction on QA and glut strengthening/core control/"suck and tuck" Resisted Lateral rotation  using kick board R/L x5.  Some slight increase in LB discomfort Pt submerged 4 ft3" Hip hinges  2x20 Seated flutter kick at hip 2x20 -add/abd x12 Cool down walking Pt requires buoyancy for support and to offload joints with strengthening exercises. Viscosity of the water is needed for resistance of strengthening; water current perturbations provides challenge to standing balance unsupported, requiring increased core activation.        Plan - 06/29/21 1445     Clinical Impression Statement Pt instroduced to setting.  She demonstrates indep and confidenced submeged.  Pain 6/10 on arrival.  She is directed through stretching, gentle LB/core strengthening and aerobic capacity challenges which she tolerates well.  does report slught increase in discomfort to 6.5/10 upon completion.  She finishes in jacuzzi (unbilled) for warm water massage x 5 mins.  She is a good candidate for aquatic therapy with improved ability to move and exercise due to water properties.  It will faciliatate and enhance pt's progress towards goals    Personal Factors and Comorbidities Comorbidity 3+    Examination-Activity Limitations Bed Mobility;Bend;Caring for Others;Carry;Locomotion Level;Stairs;Stand;Squat;Sit;Lift;Dressing    Examination-Participation Restrictions Interpersonal Relationship;Cleaning;Community Activity;Driving    Stability/Clinical Decision Making Evolving/Moderate complexity    Clinical Decision Making Moderate    Rehab Potential Good    PT Frequency 2x / week    PT Duration 6 weeks    PT Treatment/Interventions Aquatic Therapy;ADLs/Self Care Home Management;Cryotherapy;Electrical Stimulation;Moist Heat;Ultrasound;Gait training;Stair training;Functional mobility training;Therapeutic activities;Therapeutic exercise;Balance training;Neuromuscular re-education;Patient/family education;Manual techniques;Passive range of motion;Dry needling;Taping;Spinal Manipulations;Joint Manipulations    PT Next Visit Plan progress core strengtheing    PT Home Exercise Plan Access Code: [email or web address] will add aquatics  HEP if/when pt has access to pool.            Patient will benefit from skilled therapeutic intervention in order to improve the following deficits and impairments:  Decreased endurance, Decreased mobility, Difficulty walking, Increased muscle spasms, Impaired sensation, Decreased range of motion, Impaired perceived functional ability, Decreased activity tolerance, Decreased strength, Increased fascial restricitons, Impaired flexibility, Postural dysfunction, Pain  Visit Diagnosis: Chronic midline low back pain  with bilateral sciatica  Cramp and spasm  Difficulty in walking, not elsewhere classified  Muscle weakness (generalized)     Problem List Patient Active Problem List   Diagnosis Date Noted   Abnormal weight gain 05/02/2021   Chronic bilateral low back pain without sciatica 05/02/2021   Lumbar paraspinal muscle spasm 12/08/2020   COVID-19 06/16/2020   Gestational diabetes mellitus (GDM), antepartum 05/23/2020   Pain, dental 04/19/2020   GERD (gastroesophageal reflux disease) 03/24/2020   Nonocclusive mesenteric ischemia ([place]) 04/04/2018   DUB (dysfunctional uterine bleeding) 03/14/2017   Prediabetes 02/14/2017   OSA (obstructive sleep apnea) 07/05/2016   Morbid obesity ([place]) 07/05/2016   PCOS (polycystic ovarian syndrome) 05/31/2015   Abnormal EEG 01/02/2012   ADD (attention deficit disorder) 01/02/2012   Anxiety 01/02/2012   MDD (major depressive disorder) 01/02/2012   Current tobacco use 01/02/2012    Annamarie Major) Kannon Granderson MPT 06/29/2021, 2:52 PM  [facility] [address] [place], [place], [address] Phone: [phone]   Fax:  [phone]  Name: Ariana Parks MRN: [id number] Date of Birth: 1980-12-17

## 2021-07-03 ENCOUNTER — Encounter: Payer: Self-pay | Admitting: Family Medicine

## 2021-07-03 ENCOUNTER — Ambulatory Visit: Payer: Medicaid Other | Admitting: Family Medicine

## 2021-07-03 ENCOUNTER — Other Ambulatory Visit: Payer: Self-pay

## 2021-07-03 DIAGNOSIS — M545 Low back pain, unspecified: Secondary | ICD-10-CM

## 2021-07-03 DIAGNOSIS — G8929 Other chronic pain: Secondary | ICD-10-CM | POA: Diagnosis not present

## 2021-07-03 NOTE — Assessment & Plan Note (Signed)
Continue with aquatic therapy.  Strongly encouraged increased activity outside of therapy as well as dietary change for weight loss as I think this is putting significant strain on her lumbar spine.

## 2021-07-03 NOTE — Progress Notes (Signed)
Ariana Parks - 41 y.o. female MRN [id number]  Date of birth: September 13, 1980  Subjective Chief Complaint  Patient presents with   Back Pain   Abdominal Pain    HPI Ariana Parks is a 41 year old female here today for follow-up of low back pain.  Referred to physical therapy and has since started aquatic therapy as she was not tolerating physical therapy well.  She is doing well with aquatic therapy so far.  She has only had 1 session.  Her activity is fairly minimal outside of her prescribed therapy.  Her weight is up 30 pounds over the past 6 months.  ROS:  A comprehensive ROS was completed and negative except as noted per HPI  Allergies  Allergen Reactions   Citalopram     Other reaction(s): Bradycardia   Celexa [Citalopram Hydrobromide] Other (See Comments)    Hospitalized due to low heart rate   Ibuprofen Other (See Comments)    High doses (petechial bruising)   Prednisone Other (See Comments)    Shaking uncontrollably    Prilosec [Omeprazole] Other (See Comments)    Hospitalized due to low heart rate   Wellbutrin [Bupropion] Other (See Comments)    Lows seizure threshold    Flexeril [Cyclobenzaprine] Other (See Comments)    Bizarre dreams/exacerbate depression    Past Medical History:  Diagnosis Date   Abnormal Pap smear of cervix 2005   Mod dysplasia   Depression    Endometriosis    GERD (gastroesophageal reflux disease)    Gestational diabetes mellitus (GDM) affecting first pregnancy    High cholesterol    Nonocclusive mesenteric ischemia ([place]) 04/04/2018   Dx colonscopy Aug 2019 GAP   OSA (obstructive sleep apnea) 07/05/2016   Panic attack    PCOS (polycystic ovarian syndrome)     Past Surgical History:  Procedure Laterality Date   CESAREAN SECTION N/A 07/24/2020   Procedure: PRIMARY CESAREAN SECTION;  Surgeon: Everlene Farrier, MD;  Location: [facility];  Service: Obstetrics;  Laterality: N/A;   KNEE SURGERY     LEEP     TONSILLECTOMY     WISDOM TOOTH EXTRACTION       Social History   Socioeconomic History   Marital status: Single    Spouse name: Not on file   Number of children: Not on file   Years of education: Not on file   Highest education level: Not on file  Occupational History   Occupation: Designer, jewellery  Tobacco Use   Smoking status: Former    Packs/day: 0.50    Years: 15.00    Pack years: 7.50    Types: Cigarettes    Quit date: 05/25/2020    Years since quitting: 1.1   Smokeless tobacco: Never   Tobacco comments:    using vapes  Vaping Use   Vaping Use: Never used  Substance and Sexual Activity   Alcohol use: Not Currently    Alcohol/week: 0.0 standard drinks   Drug use: No   Sexual activity: Yes    Partners: Male    Birth control/protection: None  Other Topics Concern   Not on file  Social History Narrative   Not on file   Social Determinants of Health   Financial Resource Strain: Not on file  Food Insecurity: Not on file  Transportation Needs: Not on file  Physical Activity: Not on file  Stress: Not on file  Social Connections: Not on file    Family History  Problem Relation Age of Onset   Hypertension Mother  Thyroid disease Mother    GER disease Father    Migraines Father    Heart attack Other    Thyroid disease Brother     Health Maintenance  Topic Date Due   COVID-19 Vaccine (2 - Pfizer series) 06/14/2020   PAP SMEAR-Modifier  07/10/2021 (Originally 08/09/2007)   COLONOSCOPY (Pts 45-38yrs Insurance coverage will need to be confirmed)  02/07/2023   TETANUS/TDAP  05/03/2030   INFLUENZA VACCINE  Completed   Hepatitis C Screening  Completed   HIV Screening  Completed   Pneumococcal Vaccine 60-39 Years old  Aged Out   HPV VACCINES  Aged Out     ----------------------------------------------------------------------------------------------------------------------------------------------------------------------------------------------------------------- Physical Exam BP 114/76 (BP Location:  Right Arm, Patient Position: Sitting, Cuff Size: Large)    Pulse 80    Ht 5\' 9"  (1.753 m)    Wt (!) 347 lb (157.4 kg)    SpO2 96%    BMI 51.24 kg/m   Physical Exam Constitutional:      Appearance: She is well-developed.  Eyes:     General: No scleral icterus. Musculoskeletal:     Cervical back: Neck supple.  Neurological:     Mental Status: She is alert.  Psychiatric:        Mood and Affect: Mood normal.        Behavior: Behavior normal.    ------------------------------------------------------------------------------------------------------------------------------------------------------------------------------------------------------------------- Assessment and Plan  Chronic bilateral low back pain without sciatica Continue with aquatic therapy.  Strongly encouraged increased activity outside of therapy as well as dietary change for weight loss as I think this is putting significant strain on her lumbar spine.   No orders of the defined types were placed in this encounter.   No follow-ups on file.    This visit occurred during the SARS-CoV-2 public health emergency.  Safety protocols were in place, including screening questions prior to the visit, additional usage of staff PPE, and extensive cleaning of exam room while observing appropriate contact time as indicated for disinfecting solutions.

## 2021-07-04 ENCOUNTER — Ambulatory Visit (HOSPITAL_BASED_OUTPATIENT_CLINIC_OR_DEPARTMENT_OTHER): Payer: Medicaid Other | Admitting: Physical Therapy

## 2021-07-04 ENCOUNTER — Encounter (HOSPITAL_BASED_OUTPATIENT_CLINIC_OR_DEPARTMENT_OTHER): Payer: Self-pay | Admitting: Physical Therapy

## 2021-07-04 DIAGNOSIS — M6281 Muscle weakness (generalized): Secondary | ICD-10-CM

## 2021-07-04 DIAGNOSIS — R252 Cramp and spasm: Secondary | ICD-10-CM

## 2021-07-04 DIAGNOSIS — G8929 Other chronic pain: Secondary | ICD-10-CM | POA: Diagnosis not present

## 2021-07-04 DIAGNOSIS — M545 Low back pain, unspecified: Secondary | ICD-10-CM | POA: Diagnosis not present

## 2021-07-04 DIAGNOSIS — R262 Difficulty in walking, not elsewhere classified: Secondary | ICD-10-CM

## 2021-07-04 NOTE — Therapy (Signed)
[facility] [address] [place], [place], [address] Phone: [phone]   Fax:  [phone]  Physical Therapy Treatment  Patient Details  Name: Ariana Parks MRN: [id number] Date of Birth: 02-11-1981 Referring Provider (PT): Luetta Nutting DO   Encounter Date: 07/04/2021   PT End of Session - 07/04/21 0909     Visit Number 3    Number of Visits 12    Date for PT Re-Evaluation 07/17/21    Authorization Type [facility] Medicaid    PT Start Time [phone]    PT Stop Time 0945    PT Time Calculation (min) 41 min    Activity Tolerance Patient limited by pain    Behavior During Therapy [facility] for tasks assessed/performed             Past Medical History:  Diagnosis Date   Abnormal Pap smear of cervix 2005   Mod dysplasia   Depression    Endometriosis    GERD (gastroesophageal reflux disease)    Gestational diabetes mellitus (GDM) affecting first pregnancy    High cholesterol    Nonocclusive mesenteric ischemia ([place]) 04/04/2018   Dx colonscopy Aug 2019 GAP   OSA (obstructive sleep apnea) 07/05/2016   Panic attack    PCOS (polycystic ovarian syndrome)     Past Surgical History:  Procedure Laterality Date   CESAREAN SECTION N/A 07/24/2020   Procedure: PRIMARY CESAREAN SECTION;  Surgeon: Everlene Farrier, MD;  Location: [facility];  Service: Obstetrics;  Laterality: N/A;   KNEE SURGERY     LEEP     TONSILLECTOMY     WISDOM TOOTH EXTRACTION      There were no vitals filed for this visit.   Subjective Assessment - 07/04/21 1159     Subjective "discomfort day following last session but didn't linger"                                          PT Short Term Goals - 06/05/21 1821       PT SHORT TERM GOAL #1   Title Independent with initial HEP    Time 2    Period Weeks    Status New    Target Date 06/19/21               PT Long Term Goals - 06/05/21 1821       PT LONG TERM  GOAL #1   Title Pt. will be independent with progressed HEP for core strengthening to improve outcomes.    Time 6    Period Weeks    Status New    Target Date 07/17/21      PT LONG TERM GOAL #2   Title Pt. will be able to tolerate standing/walking for 20 minutes without increased pain/numbness.    Baseline 10-15 minutes with bil LE numbness and increased pain.    Time 6    Period Weeks    Status New    Target Date 07/17/21      PT LONG TERM GOAL #3   Title Patient will be able to lift 20lbs without increased LBP to care for daughter.    Baseline increased LBP with lifting    Time 6    Period Weeks    Status New    Target Date 07/17/21      PT LONG TERM GOAL #4   Title  Pt. will improve FOTO to 48% to demonstrate improved physical functioning.    Baseline 32%    Time 6    Period Weeks    Status New    Target Date 07/17/21      PT LONG TERM GOAL #5   Title Pt. will report 50% improvement in LBP and radicular symptoms.    Time 6    Period Weeks    Status New    Target Date 07/17/21             Pt seen for aquatic therapy today.  Treatment took place in water 3.25-4.8 ft in depth at the [facility] pool. Temp of water was 93.  Pt entered/exited the pool via stairs (step through pattern) independently with bilat rail.    Warm up walking fwd, bkw and side stepping x 5 minutes LB stretch in pike position at steps hip hiking R/L 3 x 20-30 seconds Hip hinges alternated with lumbar rotation 2x20 Buoyancy resisted kick board push down 2x15.  Cues for"suck and tuck" Resisted Lateral rotation  using kick board R/L x10 for core strengthening.  No discomfort today and pt reaching full rotation ROM. Cued for added resistance increasing speed/submerging kick board deeper. LB stretch into extension in prone suspension using yellow hand buoys 3 x 25 seconds. VC and demonstration for proper execution Seated flutter kick at hip 2x20;add/abd x20 Walking between exercises for  stretch and recovery  Pt requires buoyancy for support and to offload joints with strengthening exercises. Viscosity of the water is needed for resistance of strengthening; water current perturbations provides challenge to standing balance unsupported, requiring increased core activation.      Plan - 07/04/21 0945     Clinical Impression Statement Pt reports some slight muscle discomfort after last session which resolved quickly.  She is directed through activities today demonstraing improved execution as well as improved ROM of lumbar spine with rotation and improved tolerance to stretgthening exercises.  She is encouraged to contine with mindfulness of core control with everyday activities to improve strength and decrease pain ( when cooking and holding baby.    Stability/Clinical Decision Making Evolving/Moderate complexity    Clinical Decision Making Moderate    Rehab Potential Good    PT Treatment/Interventions Aquatic Therapy;ADLs/Self Care Home Management;Cryotherapy;Electrical Stimulation;Moist Heat;Ultrasound;Gait training;Stair training;Functional mobility training;Therapeutic activities;Therapeutic exercise;Balance training;Neuromuscular re-education;Patient/family education;Manual techniques;Passive range of motion;Dry needling;Taping;Spinal Manipulations;Joint Manipulations    PT Next Visit Plan progress core strengtheing    PT Home Exercise Plan Access Code: [email or web address] will add aquatics HEP if/when pt has access to pool.           Patient will benefit from skilled therapeutic intervention in order to improve the following deficits and impairments:  Decreased endurance, Decreased mobility, Difficulty walking, Increased muscle spasms, Impaired sensation, Decreased range of motion, Impaired perceived functional ability, Decreased activity tolerance, Decreased strength, Increased fascial restricitons, Impaired flexibility, Postural dysfunction, Pain  Visit Diagnosis: Chronic midline  low back pain with bilateral sciatica  Cramp and spasm  Difficulty in walking, not elsewhere classified  Muscle weakness (generalized)     Problem List Patient Active Problem List   Diagnosis Date Noted   Abnormal weight gain 05/02/2021   Chronic bilateral low back pain without sciatica 05/02/2021   Lumbar paraspinal muscle spasm 12/08/2020   COVID-19 06/16/2020   Gestational diabetes mellitus (GDM), antepartum 05/23/2020   Pain, dental 04/19/2020   GERD (gastroesophageal reflux disease) 03/24/2020   Nonocclusive mesenteric ischemia ([place]) 04/04/2018   DUB (  dysfunctional uterine bleeding) 03/14/2017   Prediabetes 02/14/2017   OSA (obstructive sleep apnea) 07/05/2016   Morbid obesity ([place]) 07/05/2016   PCOS (polycystic ovarian syndrome) 05/31/2015   Abnormal EEG 01/02/2012   ADD (attention deficit disorder) 01/02/2012   Anxiety 01/02/2012   MDD (major depressive disorder) 01/02/2012   Current tobacco use 01/02/2012    Annamarie Major) Ariana Parks MPT 07/04/2021, 1:27 PM  [facility] [place], [place], [address] Phone: [phone]   Fax:  [phone]  Name: Ariana Parks MRN: [id number] Date of Birth: Jun 27, 1980

## 2021-07-06 ENCOUNTER — Other Ambulatory Visit: Payer: Self-pay

## 2021-07-06 ENCOUNTER — Ambulatory Visit (HOSPITAL_BASED_OUTPATIENT_CLINIC_OR_DEPARTMENT_OTHER): Payer: Medicaid Other | Admitting: Physical Therapy

## 2021-07-06 ENCOUNTER — Encounter (HOSPITAL_BASED_OUTPATIENT_CLINIC_OR_DEPARTMENT_OTHER): Payer: Self-pay | Admitting: Physical Therapy

## 2021-07-06 DIAGNOSIS — R252 Cramp and spasm: Secondary | ICD-10-CM

## 2021-07-06 DIAGNOSIS — R262 Difficulty in walking, not elsewhere classified: Secondary | ICD-10-CM

## 2021-07-06 DIAGNOSIS — M545 Low back pain, unspecified: Secondary | ICD-10-CM | POA: Diagnosis not present

## 2021-07-06 DIAGNOSIS — M6281 Muscle weakness (generalized): Secondary | ICD-10-CM

## 2021-07-06 DIAGNOSIS — G8929 Other chronic pain: Secondary | ICD-10-CM | POA: Diagnosis not present

## 2021-07-06 NOTE — Therapy (Signed)
[facility] [address] [place], [place], [address] Phone: [phone]   Fax:  [phone]  Physical Therapy Treatment  Patient Details  Name: Ariana Parks MRN: [id number] Date of Birth: Apr 20, 1981 Referring Provider (PT): Luetta Nutting DO   Encounter Date: 07/06/2021   PT End of Session - 07/06/21 0910     Visit Number 5    Number of Visits 12    PT Start Time 0904    PT Stop Time 0945    PT Time Calculation (min) 41 min    Activity Tolerance Patient limited by pain    Behavior During Therapy [facility] for tasks assessed/performed             Past Medical History:  Diagnosis Date   Abnormal Pap smear of cervix 2005   Mod dysplasia   Depression    Endometriosis    GERD (gastroesophageal reflux disease)    Gestational diabetes mellitus (GDM) affecting first pregnancy    High cholesterol    Nonocclusive mesenteric ischemia ([place]) 04/04/2018   Dx colonscopy Aug 2019 GAP   OSA (obstructive sleep apnea) 07/05/2016   Panic attack    PCOS (polycystic ovarian syndrome)     Past Surgical History:  Procedure Laterality Date   CESAREAN SECTION N/A 07/24/2020   Procedure: PRIMARY CESAREAN SECTION;  Surgeon: Everlene Farrier, MD;  Location: [facility] LD [facility];  Service: Obstetrics;  Laterality: N/A;   KNEE SURGERY     LEEP     TONSILLECTOMY     WISDOM TOOTH EXTRACTION      There were no vitals filed for this visit.   Subjective Assessment - 07/06/21 0910     Subjective "discomfort day following last session but didn't linger"Back is hurting bad today.  May need new mattress.  Felt very good after last treatment.    Pertinent History history chronic LBP, C-section, GERD, OSA, PCOS, anxiety    Limitations Lifting    How long can you sit comfortably? 10-15 min with good posture.    How long can you stand comfortably? 10-15 min    How long can you walk comfortably? 10 min    Diagnostic tests lumbar IMPRESSION:  No recent fracture  is seen. Degenerative changes are noted,  particularly prominent at L1-L2 level    Patient Stated Goals help with the pain so I can walk and care for baby, exercise to lose some weight so I can take care of my baby.    Pain Onset More than a month ago                                          PT Short Term Goals - 06/05/21 1821       PT SHORT TERM GOAL #1   Title Independent with initial HEP    Time 2    Period Weeks    Status New    Target Date 06/19/21               PT Long Term Goals - 06/05/21 1821       PT LONG TERM GOAL #1   Title Pt. will be independent with progressed HEP for core strengthening to improve outcomes.    Time 6    Period Weeks    Status New    Target Date 07/17/21      PT LONG TERM GOAL #2  Title Pt. will be able to tolerate standing/walking for 20 minutes without increased pain/numbness.    Baseline 10-15 minutes with bil LE numbness and increased pain.    Time 6    Period Weeks    Status New    Target Date 07/17/21      PT LONG TERM GOAL #3   Title Patient will be able to lift 20lbs without increased LBP to care for daughter.    Baseline increased LBP with lifting    Time 6    Period Weeks    Status New    Target Date 07/17/21      PT LONG TERM GOAL #4   Title Pt. will improve FOTO to 48% to demonstrate improved physical functioning.    Baseline 32%    Time 6    Period Weeks    Status New    Target Date 07/17/21      PT LONG TERM GOAL #5   Title Pt. will report 50% improvement in LBP and radicular symptoms.    Time 6    Period Weeks    Status New    Target Date 07/17/21             Pt seen for aquatic therapy today.  Treatment took place in water 3.25-4.8 ft in depth at the [facility] pool. Temp of water was 93.  Pt entered/exited the pool via stairs (step through pattern) independently with bilat rail.    Warm up walking fwd, bkw and side stepping x 5 minutes; TR and HR  x20  Reviewed current function, pain levels, response to prior Rx, and HEP compliance.  Seated flutter kick at hip 3x25; add/abd x20 LB stretch in pike position at steps hip hiking R/L 3 x 20-30 seconds Resisted Lateral rotation  using kick board R/L 2x10 for core strengthening. Increased submersion of kick board to 90% tolerated. Buoyancy resisted yellow hand buoy pushdown for core engagement x20 alternating with prone suspension for core strength x 30 secs 3 trials Supine suspension with core engagement holding isometrically 3 x 30 secs.  Introducted prone to supine pendulum  Walking between exercises for stretch and recovery  Pt requires buoyancy for support and to offload joints with strengthening exercises. Viscosity of the water is needed for resistance of strengthening; water current perturbations provides challenge to standing balance unsupported, requiring increased core activation.      Plan - 07/06/21 0917     Clinical Impression Statement Improved toleration to last visit reporting "feeling good" with little discomfort afterwards.  Flared this am which she is attributing to needing new mattress. Advanced core strengthening using prone and supine suspended positioning.  Pt executes well for first go around.  Able to gain good core engagement as she reports and is visably observed.  Improvement demonstrated in core rotation movement and strength as pt able to tolerate resisted core rotation in full range with increased resistance and without pain    Personal Factors and Comorbidities Comorbidity 3+    Stability/Clinical Decision Making Evolving/Moderate complexity    Clinical Decision Making Moderate    Rehab Potential Good    PT Frequency 2x / week    PT Duration 6 weeks    PT Treatment/Interventions Aquatic Therapy;ADLs/Self Care Home Management;Cryotherapy;Electrical Stimulation;Moist Heat;Ultrasound;Gait training;Stair training;Functional mobility training;Therapeutic  activities;Therapeutic exercise;Balance training;Neuromuscular re-education;Patient/family education;Manual techniques;Passive range of motion;Dry needling;Taping;Spinal Manipulations;Joint Manipulations    PT Next Visit Plan progress core strengtheing    PT Home Exercise Plan Access Code: [email or web address] will  add aquatics HEP if/when pt has access to pool.            Patient will benefit from skilled therapeutic intervention in order to improve the following deficits and impairments:  Decreased endurance, Decreased mobility, Difficulty walking, Increased muscle spasms, Impaired sensation, Decreased range of motion, Impaired perceived functional ability, Decreased activity tolerance, Decreased strength, Increased fascial restricitons, Impaired flexibility, Postural dysfunction, Pain  Visit Diagnosis: Chronic midline low back pain with bilateral sciatica  Cramp and spasm  Difficulty in walking, not elsewhere classified  Muscle weakness (generalized)     Problem List Patient Active Problem List   Diagnosis Date Noted   Abnormal weight gain 05/02/2021   Chronic bilateral low back pain without sciatica 05/02/2021   Lumbar paraspinal muscle spasm 12/08/2020   COVID-19 06/16/2020   Gestational diabetes mellitus (GDM), antepartum 05/23/2020   Pain, dental 04/19/2020   GERD (gastroesophageal reflux disease) 03/24/2020   Nonocclusive mesenteric ischemia ([place]) 04/04/2018   DUB (dysfunctional uterine bleeding) 03/14/2017   Prediabetes 02/14/2017   OSA (obstructive sleep apnea) 07/05/2016   Morbid obesity ([place]) 07/05/2016   PCOS (polycystic ovarian syndrome) 05/31/2015   Abnormal EEG 01/02/2012   ADD (attention deficit disorder) 01/02/2012   Anxiety 01/02/2012   MDD (major depressive disorder) 01/02/2012   Current tobacco use 01/02/2012    Annamarie Major) Stanford Strauch MPT 07/06/2021, 1:10 PM  [facility] [address] [place], [place],  [address] Phone: [phone]   Fax:  [phone]  Name: Ariana Parks MRN: [id number] Date of Birth: Jun 21, 1981

## 2021-07-11 ENCOUNTER — Other Ambulatory Visit: Payer: Self-pay

## 2021-07-11 ENCOUNTER — Encounter: Payer: Medicaid Other | Admitting: Physical Therapy

## 2021-07-11 ENCOUNTER — Ambulatory Visit (HOSPITAL_BASED_OUTPATIENT_CLINIC_OR_DEPARTMENT_OTHER): Payer: Medicaid Other | Admitting: Physical Therapy

## 2021-07-11 ENCOUNTER — Encounter (HOSPITAL_BASED_OUTPATIENT_CLINIC_OR_DEPARTMENT_OTHER): Payer: Self-pay | Admitting: Physical Therapy

## 2021-07-11 DIAGNOSIS — R252 Cramp and spasm: Secondary | ICD-10-CM

## 2021-07-11 DIAGNOSIS — M545 Low back pain, unspecified: Secondary | ICD-10-CM | POA: Diagnosis not present

## 2021-07-11 DIAGNOSIS — M5442 Lumbago with sciatica, left side: Secondary | ICD-10-CM

## 2021-07-11 DIAGNOSIS — R262 Difficulty in walking, not elsewhere classified: Secondary | ICD-10-CM

## 2021-07-11 DIAGNOSIS — G8929 Other chronic pain: Secondary | ICD-10-CM | POA: Diagnosis not present

## 2021-07-11 DIAGNOSIS — M6281 Muscle weakness (generalized): Secondary | ICD-10-CM

## 2021-07-11 NOTE — Therapy (Signed)
[facility] [place], [place], [address] Phone: [phone]   Fax:  [phone]  Physical Therapy Treatment  Patient Details  Name: Ariana Parks MRN: [id number] Date of Birth: 07-27-1980 Referring Provider (PT): Luetta Nutting DO   Encounter Date: 07/11/2021   PT End of Session - 07/11/21 1347     Visit Number 6    Number of Visits 12    Date for PT Re-Evaluation 07/17/21    Authorization Type [employer]    PT Start Time 1400    PT Stop Time 6256    PT Time Calculation (min) 45 min    Activity Tolerance Patient limited by pain;Patient tolerated treatment well    Behavior During Therapy --             Past Medical History:  Diagnosis Date   Abnormal Pap smear of cervix 2005   Mod dysplasia   Depression    Endometriosis    GERD (gastroesophageal reflux disease)    Gestational diabetes mellitus (GDM) affecting first pregnancy    High cholesterol    Nonocclusive mesenteric ischemia ([place]) 04/04/2018   Dx colonscopy Aug 2019 GAP   OSA (obstructive sleep apnea) 07/05/2016   Panic attack    PCOS (polycystic ovarian syndrome)     Past Surgical History:  Procedure Laterality Date   CESAREAN SECTION N/A 07/24/2020   Procedure: PRIMARY CESAREAN SECTION;  Surgeon: Everlene Farrier, MD;  [facility];  Service: Obstetrics;  Laterality: N/A;   KNEE SURGERY     LEEP     TONSILLECTOMY     WISDOM TOOTH EXTRACTION      There were no vitals filed for this visit.   Subjective Assessment - 07/11/21 1414     Subjective "Pain has come down some, not as sharp"    Pain Score 5     Pain Location Back    Pain Orientation Lower    Pain Type Chronic pain                                          PT Short Term Goals - 06/05/21 1821       PT SHORT TERM GOAL #1   Title Independent with initial HEP    Time 2    Period Weeks    Status New    Target Date 06/19/21                PT Long Term Goals - 07/11/21 1406       PT LONG TERM GOAL #4   Baseline Baseline 32%      07/11/21: 36    Status On-going            Pt seen for aquatic therapy today.  Treatment took place in water 3.25-4.8 ft in depth at the [facility] pool. Temp of water was 93.  Pt entered/exited the pool via stairs (step through pattern) independently with bilat rail.     Warm up walking fwd, bkw and side stepping x 5 minutes;   LB stretch in pike position at steps hip hiking R/L 3 x 20-30 seconds  Reviewed current function, pain levels decreased (5/10), response to prior Rx, and HEP compliance (pt not completing due to increased pain) Foto completed  Resisted Lateral rotation  using kick board R/L 2x10 for core strengthening. Increased speed and submersion  of kick board tolerated well. Buoyancy resisted ball pushdown for core engagement x20 alternating with rotation. Supine suspension with core engagement  and stretching holding isomerically alternating with prone suspension 3 x 30 secs  Introduced side to side pendulum. Initiated holding position isomerically R/L 3x30 sec. Progressed to exercise.  Pt completes with vc, and demonstration Seated: hip flutter and add/abd 3x25 reps      Pt requires buoyancy for support and to offload joints with strengthening exercises. Viscosity of the water is needed for resistance of strengthening; water current perturbations provides challenge to standing balance unsupported, requiring increased core activation.       Plan - 07/11/21 1404     Clinical Impression Statement Foto 36 from 32.  Pt reports application of core instruction at home stating she has been nursing her baby while maintaining tightened abdomin and glutes.  We advance core strengtheing today with lateral core exercises.  She completes with increase in sx.  Overall strength look to be improving as evidenced by increased resistance toleration with all  activities in pool/aquatics, toleration of high level core strengthening challenges.    Stability/Clinical Decision Making Evolving/Moderate complexity    Clinical Decision Making Moderate    Rehab Potential Good    PT Frequency 2x / week    PT Treatment/Interventions Aquatic Therapy;ADLs/Self Care Home Management;Cryotherapy;Electrical Stimulation;Moist Heat;Ultrasound;Gait training;Stair training;Functional mobility training;Therapeutic activities;Therapeutic exercise;Balance training;Neuromuscular re-education;Patient/family education;Manual techniques;Passive range of motion;Dry needling;Taping;Spinal Manipulations;Joint Manipulations    PT Next Visit Plan Plan to see on land therapist in next week for re-assessment             Patient will benefit from skilled therapeutic intervention in order to improve the following deficits and impairments:  Decreased endurance, Decreased mobility, Difficulty walking, Increased muscle spasms, Impaired sensation, Decreased range of motion, Impaired perceived functional ability, Decreased activity tolerance, Decreased strength, Increased fascial restricitons, Impaired flexibility, Postural dysfunction, Pain  Visit Diagnosis: Chronic midline low back pain with bilateral sciatica  Cramp and spasm  Difficulty in walking, not elsewhere classified  Muscle weakness (generalized)     Problem List Patient Active Problem List   Diagnosis Date Noted   Abnormal weight gain 05/02/2021   Chronic bilateral low back pain without sciatica 05/02/2021   Lumbar paraspinal muscle spasm 12/08/2020   COVID-19 06/16/2020   Gestational diabetes mellitus (GDM), antepartum 05/23/2020   Pain, dental 04/19/2020   GERD (gastroesophageal reflux disease) 03/24/2020   Nonocclusive mesenteric ischemia ([place]) 04/04/2018   DUB (dysfunctional uterine bleeding) 03/14/2017   Prediabetes 02/14/2017   OSA (obstructive sleep apnea) 07/05/2016   Morbid obesity ([place]) 07/05/2016    PCOS (polycystic ovarian syndrome) 05/31/2015   Abnormal EEG 01/02/2012   ADD (attention deficit disorder) 01/02/2012   Anxiety 01/02/2012   MDD (major depressive disorder) 01/02/2012   Current tobacco use 01/02/2012    Annamarie Major) Ysabela Keisler MPT 07/11/2021, 6:24 PM  [facility] [address] [place], [place], [address] Phone: [phone]   Fax:  [phone]  Name: Christmas Faraci MRN: [id number] Date of Birth: 1981/04/12

## 2021-07-13 ENCOUNTER — Encounter (HOSPITAL_BASED_OUTPATIENT_CLINIC_OR_DEPARTMENT_OTHER): Payer: Self-pay | Admitting: Physical Therapy

## 2021-07-13 ENCOUNTER — Other Ambulatory Visit: Payer: Self-pay

## 2021-07-13 ENCOUNTER — Ambulatory Visit (HOSPITAL_BASED_OUTPATIENT_CLINIC_OR_DEPARTMENT_OTHER): Payer: Medicaid Other | Admitting: Physical Therapy

## 2021-07-13 DIAGNOSIS — R262 Difficulty in walking, not elsewhere classified: Secondary | ICD-10-CM

## 2021-07-13 DIAGNOSIS — G8929 Other chronic pain: Secondary | ICD-10-CM

## 2021-07-13 DIAGNOSIS — R252 Cramp and spasm: Secondary | ICD-10-CM

## 2021-07-13 DIAGNOSIS — M6281 Muscle weakness (generalized): Secondary | ICD-10-CM

## 2021-07-13 DIAGNOSIS — M545 Low back pain, unspecified: Secondary | ICD-10-CM | POA: Diagnosis not present

## 2021-07-13 NOTE — Therapy (Signed)
[facility] [place], [place], [address] Phone: [phone]   Fax:  [phone]  Physical Therapy Treatment  Patient Details  Name: Ariana Parks MRN: [id number] Date of Birth: 09/11/1980 Referring Provider (PT): Luetta Nutting DO   Encounter Date: 07/13/2021   PT End of Session - 07/13/21 1447     Visit Number 7    Number of Visits 12    Date for PT Re-Evaluation 07/17/21    Authorization Type [employer]    PT Start Time 1245    PT Stop Time 1529    PT Time Calculation (min) 41 min    Activity Tolerance Patient limited by pain;Patient tolerated treatment well             Past Medical History:  Diagnosis Date   Abnormal Pap smear of cervix 2005   Mod dysplasia   Depression    Endometriosis    GERD (gastroesophageal reflux disease)    Gestational diabetes mellitus (GDM) affecting first pregnancy    High cholesterol    Nonocclusive mesenteric ischemia ([place]) 04/04/2018   Dx colonscopy Aug 2019 GAP   OSA (obstructive sleep apnea) 07/05/2016   Panic attack    PCOS (polycystic ovarian syndrome)     Past Surgical History:  Procedure Laterality Date   CESAREAN SECTION N/A 07/24/2020   Procedure: PRIMARY CESAREAN SECTION;  Surgeon: Everlene Farrier, MD;  [facility]: [facility];  Service: Obstetrics;  Laterality: N/A;   KNEE SURGERY     LEEP     TONSILLECTOMY     WISDOM TOOTH EXTRACTION      There were no vitals filed for this visit.   Subjective Assessment - 07/13/21 1345     Subjective "Doing ok"  Felt better after last treatment although pt states pain returns    Currently in Pain? Yes    Pain Score 4     Pain Location Back    Pain Orientation Lower    Pain Descriptors / Indicators Spasm;Sore    Pain Type Chronic pain    Pain Onset More than a month ago                                          PT Short Term Goals - 06/05/21 1821       PT SHORT  TERM GOAL #1   Title Independent with initial HEP    Time 2    Period Weeks    Status New    Target Date 06/19/21               PT Long Term Goals - 07/11/21 1406       PT LONG TERM GOAL #4   Baseline Baseline 32%      07/11/21: 36    Status On-going            Pt seen for aquatic therapy today.  Treatment took place in water 3.25-4.8 ft in depth at the [facility] pool. Temp of water was 93.  Pt entered/exited the pool via stairs (step through pattern) independently with bilat rail.     Warm up walking fwd, bkw and side stepping x 5 minutes;   LB stretch in pike position at steps hip hiking R/L 3 x 20-30 seconds   Reviewed current function, pain levels , response to prior Rx, and HEP compliance   Standing:  hip flex; extension , marching and add/abd 2x10   Plank on bench hip extension 2x10 Plank using yellow hand buoys holding position 3x30 seconds -plank with lat pull down 2x5  Resisted core rotations using kick board R/L x10 Supine suspension with core engagement  and stretching holding isomerically alternating with prone suspension 3 x 30 secs  Introduced side to side pendulum. Initiated holding position isomerically R/L 3x30 sec. Progressed to exercise.  Pt completes with vc, and demonstration  Pt requires buoyancy for support and to offload joints with strengthening exercises. Viscosity of the water is needed for resistance of strengthening; water current perturbations provides challenge to standing balance unsupported, requiring increased core activation.       Plan - 07/14/21 1347     Clinical Impression Statement Pt directed through exercises advancing core strengtheing and edu on posture/positioning.  She completes without any adverse effects.  As strength in core improves pt able to tolerat eand execute  higher level core strengtheing exercises.    Stability/Clinical Decision Making Evolving/Moderate complexity    Clinical Decision Making  Moderate    Rehab Potential Good    PT Frequency 2x / week    PT Duration 6 weeks    PT Treatment/Interventions Aquatic Therapy;ADLs/Self Care Home Management;Cryotherapy;Electrical Stimulation;Moist Heat;Ultrasound;Gait training;Stair training;Functional mobility training;Therapeutic activities;Therapeutic exercise;Balance training;Neuromuscular re-education;Patient/family education;Manual techniques;Passive range of motion;Dry needling;Taping;Spinal Manipulations;Joint Manipulations    PT Next Visit Plan Plan to see on land therapist in next week for re-assessment    PT Home Exercise Plan Access Code: [email or web address] will add aquatics HEP if/when pt has access to pool.             Patient will benefit from skilled therapeutic intervention in order to improve the following deficits and impairments:  Decreased endurance, Decreased mobility, Difficulty walking, Increased muscle spasms, Impaired sensation, Decreased range of motion, Impaired perceived functional ability, Decreased activity tolerance, Decreased strength, Increased fascial restricitons, Impaired flexibility, Postural dysfunction, Pain  Visit Diagnosis: Chronic midline low back pain with bilateral sciatica  Cramp and spasm  Difficulty in walking, not elsewhere classified  Muscle weakness (generalized)     Problem List Patient Active Problem List   Diagnosis Date Noted   Abnormal weight gain 05/02/2021   Chronic bilateral low back pain without sciatica 05/02/2021   Lumbar paraspinal muscle spasm 12/08/2020   COVID-19 06/16/2020   Gestational diabetes mellitus (GDM), antepartum 05/23/2020   Pain, dental 04/19/2020   GERD (gastroesophageal reflux disease) 03/24/2020   Nonocclusive mesenteric ischemia ([place]) 04/04/2018   DUB (dysfunctional uterine bleeding) 03/14/2017   Prediabetes 02/14/2017   OSA (obstructive sleep apnea) 07/05/2016   Morbid obesity ([place]) 07/05/2016   PCOS (polycystic ovarian syndrome) 05/31/2015    Abnormal EEG 01/02/2012   ADD (attention deficit disorder) 01/02/2012   Anxiety 01/02/2012   MDD (major depressive disorder) 01/02/2012   Current tobacco use 01/02/2012    Annamarie Major) Darina Hartwell MPT 07/14/2021, 1:50 PM  [facility] [address] [place], [place], [address] Phone: [phone]   Fax:  [phone]  Name: Ariana Parks MRN: [id number] Date of Birth: 06/10/1981

## 2021-07-18 ENCOUNTER — Ambulatory Visit (HOSPITAL_BASED_OUTPATIENT_CLINIC_OR_DEPARTMENT_OTHER): Payer: Self-pay | Admitting: Physical Therapy

## 2021-07-20 ENCOUNTER — Ambulatory Visit (HOSPITAL_BASED_OUTPATIENT_CLINIC_OR_DEPARTMENT_OTHER): Payer: Self-pay | Admitting: Physical Therapy

## 2021-07-24 ENCOUNTER — Ambulatory Visit: Payer: Medicaid Other | Admitting: Physical Therapy

## 2021-07-24 ENCOUNTER — Telehealth: Payer: Medicaid Other | Admitting: Nurse Practitioner

## 2021-07-24 ENCOUNTER — Encounter: Payer: Self-pay | Admitting: Family Medicine

## 2021-07-24 DIAGNOSIS — J0140 Acute pansinusitis, unspecified: Secondary | ICD-10-CM | POA: Diagnosis not present

## 2021-07-24 MED ORDER — AMOXICILLIN-POT CLAVULANATE 875-125 MG PO TABS: 1.0000 | ORAL_TABLET | Freq: Two times a day (BID) | ORAL | 0 refills | Status: AC

## 2021-07-24 NOTE — Progress Notes (Signed)
Virtual Visit Consent   Ariana Parks, you are scheduled for a virtual visit with a [facility] provider today.     Just as with appointments in the office, your consent must be obtained to participate.  Your consent will be active for this visit and any virtual visit you may have with one of our providers in the next 365 days.     If you have a MyChart account, a copy of this consent can be sent to you electronically.  All virtual visits are billed to your insurance company just like a traditional visit in the office.    As this is a virtual visit, video technology does not allow for your provider to perform a traditional examination.  This may limit your provider's ability to fully assess your condition.  If your provider identifies any concerns that need to be evaluated in person or the need to arrange testing (such as labs, EKG, etc.), we will make arrangements to do so.     Although advances in technology are sophisticated, we cannot ensure that it will always work on either your end or our end.  If the connection with a video visit is poor, the visit may have to be switched to a telephone visit.  With either a video or telephone visit, we are not always able to ensure that we have a secure connection.     I need to obtain your verbal consent now.   Are you willing to proceed with your visit today?    Ariana Parks has provided verbal consent on 07/24/2021 for a virtual visit (video or telephone).   Apolonio Schneiders, FNP   Date: 07/24/2021 10:54 AM   Virtual Visit via Video Note   I, Apolonio Schneiders, connected with  Ariana Parks  ([id number], 12-Jul-1980) on 07/24/21 at 11:00 AM EST by a video-enabled telemedicine application and verified that I am speaking with the correct person using two identifiers.  Location: Patient: Virtual Visit Location Patient: Home Provider: Virtual Visit Location Provider: Home Office   I discussed the limitations of evaluation and management by  telemedicine and the availability of in person appointments. The patient expressed understanding and agreed to proceed.    History of Present Illness: Ariana Parks is a 41 y.o. who identifies as a female who was assigned female at birth, and is being seen today with complaints of sinus pressure and congestion, she has a runny nose, she also noted some burning in her nostrils.   She noted pain in her teeth today that made her feel like this has become a sinus infection   Denies a fever.   She has not taken a COVID test  Denies any sick contacts.   She had COVID in December.   She has not taken anything over the counter  She is drinking tea with honey    Problems:  Patient Active Problem List   Diagnosis Date Noted   Abnormal weight gain 05/02/2021   Chronic bilateral low back pain without sciatica 05/02/2021   Lumbar paraspinal muscle spasm 12/08/2020   COVID-19 06/16/2020   Gestational diabetes mellitus (GDM), antepartum 05/23/2020   Pain, dental 04/19/2020   GERD (gastroesophageal reflux disease) 03/24/2020   Nonocclusive mesenteric ischemia ([place]) 04/04/2018   DUB (dysfunctional uterine bleeding) 03/14/2017   Prediabetes 02/14/2017   OSA (obstructive sleep apnea) 07/05/2016   Morbid obesity ([place]) 07/05/2016   PCOS (polycystic ovarian syndrome) 05/31/2015   Abnormal EEG 01/02/2012   ADD (attention deficit disorder) 01/02/2012  Anxiety 01/02/2012   MDD (major depressive disorder) 01/02/2012   Current tobacco use 01/02/2012    Allergies:  Allergies  Allergen Reactions   Citalopram     Other reaction(s): Bradycardia   Celexa [Citalopram Hydrobromide] Other (See Comments)    Hospitalized due to low heart rate   Ibuprofen Other (See Comments)    High doses (petechial bruising)   Prednisone Other (See Comments)    Shaking uncontrollably    Prilosec [Omeprazole] Other (See Comments)    Hospitalized due to low heart rate   Wellbutrin [Bupropion] Other (See Comments)     Lows seizure threshold    Flexeril [Cyclobenzaprine] Other (See Comments)    Bizarre dreams/exacerbate depression   Medications:  Current Outpatient Medications:    acetaminophen (TYLENOL) 325 MG tablet, Take 2 tablets (650 mg total) by mouth every 6 (six) hours as needed., Disp: 30 tablet, Rfl: 0   sertraline (ZOLOFT) 100 MG tablet, Take 2 tablets (200 mg total) by mouth daily., Disp: 180 tablet, Rfl: 1  Observations/Objective: Patient is well-developed, well-nourished in no acute distress.  Resting comfortably at home.  Head is normocephalic, atraumatic.  No labored breathing.  Speech is clear and coherent with logical content.  Patient is alert and oriented at baseline.    Assessment and Plan: 1. Acute non-recurrent pansinusitis Take with food  Increase water intake  May use Mucinex to help with congestion as well as discussed   - amoxicillin-clavulanate (AUGMENTIN) 875-125 MG tablet; Take 1 tablet by mouth 2 (two) times daily for 7 days.  Dispense: 14 tablet; Refill: 0     Follow Up Instructions: I discussed the assessment and treatment plan with the patient. The patient was provided an opportunity to ask questions and all were answered. The patient agreed with the plan and demonstrated an understanding of the instructions.  A copy of instructions were sent to the patient via MyChart unless otherwise noted below.    The patient was advised to call back or seek an in-person evaluation if the symptoms worsen or if the condition fails to improve as anticipated.  Time:  I spent 10 minutes with the patient via telehealth technology discussing the above problems/concerns.    Apolonio Schneiders, FNP

## 2021-08-03 ENCOUNTER — Encounter: Payer: Self-pay | Admitting: Family Medicine

## 2021-08-03 NOTE — Telephone Encounter (Signed)
How long has this been going on?  How high is temp?  Using anything for this?

## 2021-08-17 ENCOUNTER — Other Ambulatory Visit: Payer: Self-pay | Admitting: Family Medicine

## 2021-09-05 ENCOUNTER — Ambulatory Visit: Payer: Medicaid Other | Attending: Family Medicine | Admitting: Physical Therapy

## 2021-09-05 ENCOUNTER — Other Ambulatory Visit: Payer: Self-pay

## 2021-09-05 ENCOUNTER — Encounter: Payer: Self-pay | Admitting: Physical Therapy

## 2021-09-05 DIAGNOSIS — G8929 Other chronic pain: Secondary | ICD-10-CM | POA: Diagnosis present

## 2021-09-05 DIAGNOSIS — R252 Cramp and spasm: Secondary | ICD-10-CM | POA: Diagnosis present

## 2021-09-05 DIAGNOSIS — M6281 Muscle weakness (generalized): Secondary | ICD-10-CM

## 2021-09-05 DIAGNOSIS — R262 Difficulty in walking, not elsewhere classified: Secondary | ICD-10-CM | POA: Diagnosis present

## 2021-09-05 DIAGNOSIS — M5442 Lumbago with sciatica, left side: Secondary | ICD-10-CM | POA: Insufficient documentation

## 2021-09-05 DIAGNOSIS — M5441 Lumbago with sciatica, right side: Secondary | ICD-10-CM | POA: Insufficient documentation

## 2021-09-05 MED ORDER — NAPROXEN 500 MG PO TABS: 500.0000 mg | ORAL_TABLET | Freq: Two times a day (BID) | ORAL | 2 refills | Status: DC

## 2021-09-05 NOTE — Patient Instructions (Signed)
Access Code: [email or web address] ?URL: [email or web address] ?Date: 09/05/2021 ?Prepared by: Glenetta Hew ? ?Exercises ?Supine Lower Trunk Rotation - 1 x daily - 7 x weekly - 3 sets - 10 reps ?Supine Posterior Pelvic Tilt - 1 x daily - 7 x weekly - 1 sets - 10 reps - 5-10 sec hold ?Supine Bridge - 1 x daily - 7 x weekly - 3 sets - 10 reps ?Prone Press Up On Elbows - 1 x daily - 7 x weekly - 3 sets - 10 reps ?Prone Knee Flexion - 1 x daily - 7 x weekly - 3 sets - 10 reps ?Seated Pelvic Tilt - 1 x daily - 7 x weekly - 1 sets - 10 reps - 5-10 sec hold ?Seated March - 1 x daily - 7 x weekly - 3 sets - 10 reps ?Heel Raises with Counter Support - 1 x daily - 7 x weekly - 3 sets - 10 reps ?Standing Hip Extension with Counter Support - 1 x daily - 7 x weekly - 3 sets - 10 reps ?Standing Hip Abduction with Counter Support - 1 x daily - 7 x weekly - 3 sets - 10 reps ? ?

## 2021-09-05 NOTE — Therapy (Signed)
[facility] [address]  [address] [place], [place], [address] Phone: [phone]   Fax:  [phone]  Physical Therapy Re-evaluation  Patient Details  Name: Ariana Parks MRN: [id number] Date of Birth: December 20, 1980 Referring Provider (PT): Everrett Coombe DO   Encounter Date: 09/05/2021   PT End of Session - 09/05/21 1625     Visit Number 8    Number of Visits 16    Date for PT Re-Evaluation 10/31/21    Authorization Type [employer]    PT Start Time 1615    PT Stop Time 1700    PT Time Calculation (min) 45 min    Activity Tolerance Patient limited by pain;Patient tolerated treatment well             Past Medical History:  Diagnosis Date   Abnormal Pap smear of cervix 2005   Mod dysplasia   Depression    Endometriosis    GERD (gastroesophageal reflux disease)    Gestational diabetes mellitus (GDM) affecting first pregnancy    High cholesterol    Nonocclusive mesenteric ischemia (HCC) 04/04/2018   Dx colonscopy Aug 2019 GAP   OSA (obstructive sleep apnea) 07/05/2016   Panic attack    PCOS (polycystic ovarian syndrome)     Past Surgical History:  Procedure Laterality Date   CESAREAN SECTION N/A 07/24/2020   Procedure: PRIMARY CESAREAN SECTION;  Surgeon: Harold Hedge, MD;  Location: [facility];  Service: Obstetrics;  Laterality: N/A;   KNEE SURGERY     LEEP     TONSILLECTOMY     WISDOM TOOTH EXTRACTION      There were no vitals filed for this visit.    Subjective Assessment - 09/05/21 1618     Subjective Pt. reported that the aquatic therapy was extremely helpful, she was tired after sessions but felt that it was making her stronger.  Due to illness, has not been able to return until re-evaluation, so starting to feel effect of not having therapy.    Pertinent History history chronic LBP, C-section, GERD, OSA, PCOS, anxiety    Diagnostic tests lumbar IMPRESSION:  No recent fracture is seen.  Degenerative changes are noted,  particularly prominent at L1-L2 level    Patient Stated Goals help with the pain so I can walk and care for baby, exercise to lose some weight so I can take care of my baby.    Currently in Pain? Yes    Pain Score 6     Pain Location Back    Pain Orientation Lower    Pain Radiating Towards toes stay numb, thighs get numb with walking    Pain Onset More than a month ago                [facility] PT Assessment - 09/05/21 0001       Assessment   Medical Diagnosis M54.50,G89.29 (ICD-10-CM) - Chronic bilateral low back pain without sciatica    Referring Provider (PT) Everrett Coombe DO    Hand Dominance Left    Next MD Visit January 2023    Prior Therapy no      Precautions   Precautions None      Restrictions   Weight Bearing Restrictions No      Observation/Other Assessments   Focus on Therapeutic Outcomes (FOTO)  35      AROM   Overall AROM  Deficits    Overall AROM Comments increased low back pain all directions except  rotation.    AROM Assessment Site Lumbar      Strength   Overall Strength Deficits    Overall Strength Comments decreased core strength.                        Objective measurements completed on examination: See above findings.       [facility] Adult PT Treatment/Exercise - 09/05/21 0001       Exercises   Exercises Lumbar      Lumbar Exercises: Stretches   Lower Trunk Rotation Limitations x10 small ROM tolerated well    Pelvic Tilt 5 reps    Lumbar Stabilization Level 1 Limitations marching with TrA activation    Press Ups Limitations demo only      Lumbar Exercises: Standing   Heel Raises 10 reps    Other Standing Lumbar Exercises standing hip abduction and hip extension x 5 bil at counter for support.      Lumbar Exercises: Supine   Bridge Limitations x 5 small, unable to lift buttocks off table.      Lumbar Exercises: Prone   Single Arm Raises Limitations demo    Straight Leg Raises  Limitations demo    Other Prone Lumbar Exercises prone knee bends, demo                     PT Education - 09/05/21 1732     Education Details HEP update; Access Code: [email or web address]    Person(s) Educated Patient    Methods Explanation;Demonstration;Handout    Comprehension Verbalized understanding              PT Short Term Goals - 09/05/21 1625       PT SHORT TERM GOAL #1   Title Independent with initial HEP    Time 2    Period Weeks    Status Achieved    Target Date 06/19/21               PT Long Term Goals - 09/05/21 1625       PT LONG TERM GOAL #1   Title Pt. will be independent with progressed HEP for core strengthening to improve outcomes.    Time 6    Period Weeks    Status On-going    Target Date 10/31/21      PT LONG TERM GOAL #2   Title Pt. will be able to tolerate standing/walking for 20 minutes without increased pain/numbness.    Baseline 10-15 minutes with bil LE numbness and increased pain.    Time 6    Period Weeks    Status On-going   09/05/21- reports improved tolerance to standing and walking overall, but still <15 min   Target Date 10/31/21      PT LONG TERM GOAL #3   Title Patient will be able to lift 20lbs without increased LBP to care for daughter.    Baseline increased LBP with lifting    Time 6    Period Weeks    Status Achieved    Target Date 10/31/21      PT LONG TERM GOAL #4   Title Pt. will improve FOTO to 48% to demonstrate improved physical functioning.    Baseline 32%    Time 6    Period Weeks    Status On-going   09/05/21- 35%, no significant change.   Target Date 10/31/21      PT LONG TERM GOAL #5   Title Pt.  will report 50% improvement in LBP and radicular symptoms.    Time 6    Period Weeks    Status On-going   09/05/21- 10% improvement   Target Date 10/31/21                    Plan - 09/05/21 1720     Clinical Impression Statement Patient reports significant benefit with aquatic therapy,  and more LBP now that she has had several weeks without therapy.  She still demonstates significant core weakness, decreased tolerance to activity/movement and LBP. Her FOTO was 35% today, demonstrate 65% impairment.  She did meet LTG # 3 and is able to lift daughter Ian Bushman) without increased LBP, and reports 10% improvement overall.  She would benefit from continued skilled therapy with focus on aquatic therapy, with goal of transitioning to gym based aquatic exercise program, as [facility]  Today reviewed exercises (primarily demo only due to poor tolerance to positioning), in supine, prone, seated and standing to start strengthening program for home as well. ,    Personal Factors and Comorbidities Comorbidity 3+    Comorbidities chronic LBP, C-section, PCOS, obesity, GERD, anxiety/depression, bil knee surgery (lateral release)    Examination-Activity Limitations Bed Mobility;Bend;Caring for Others;Carry;Locomotion Level;Stairs;Stand;Squat;Sit;Lift;Dressing    Examination-Participation Restrictions Interpersonal Relationship;Cleaning;Community Activity;Driving    Stability/Clinical Decision Making Evolving/Moderate complexity    Rehab Potential Good    PT Frequency Other (comment)   1-2x/week   PT Duration 8 weeks    PT Treatment/Interventions Aquatic Therapy;ADLs/Self Care Home Management;Cryotherapy;Electrical Stimulation;Moist Heat;Ultrasound;Gait training;Stair training;Functional mobility training;Therapeutic activities;Therapeutic exercise;Balance training;Neuromuscular re-education;Patient/family education;Manual techniques;Passive range of motion;Dry needling;Taping;Spinal Manipulations;Joint Manipulations    PT Next Visit Plan continue core strengthening as tolerated.    PT Home Exercise Plan Access Code: [email or web address];  Access Code: [email or web address] (09/05/21)             Patient will benefit from skilled therapeutic intervention in order to improve the following deficits and  impairments:  Decreased endurance, Decreased mobility, Difficulty walking, Increased muscle spasms, Impaired sensation, Decreased range of motion, Impaired perceived functional ability, Decreased activity tolerance, Decreased strength, Increased fascial restricitons, Impaired flexibility, Postural dysfunction, Pain  Visit Diagnosis: Chronic midline low back pain with bilateral sciatica  Cramp and spasm  Difficulty in walking, not elsewhere classified  Muscle weakness (generalized)     Problem List Patient Active Problem List   Diagnosis Date Noted   Abnormal weight gain 05/02/2021   Chronic bilateral low back pain without sciatica 05/02/2021   Lumbar paraspinal muscle spasm 12/08/2020   COVID-19 06/16/2020   Gestational diabetes mellitus (GDM), antepartum 05/23/2020   Pain, dental 04/19/2020   GERD (gastroesophageal reflux disease) 03/24/2020   Nonocclusive mesenteric ischemia (HCC) 04/04/2018   DUB (dysfunctional uterine bleeding) 03/14/2017   Prediabetes 02/14/2017   OSA (obstructive sleep apnea) 07/05/2016   Morbid obesity (HCC) 07/05/2016   PCOS (polycystic ovarian syndrome) 05/31/2015   Abnormal EEG 01/02/2012   ADD (attention deficit disorder) 01/02/2012   Anxiety 01/02/2012   MDD (major depressive disorder) 01/02/2012   Current tobacco use 01/02/2012    Jena Gauss, PT, DPT  09/05/2021, 5:33 PM  [facility] [address]  [address] [place], [place], [address] Phone: [phone]   Fax:  [phone]  Name: Ariana Parks MRN: [id number] Date of Birth: 02/05/1981

## 2021-09-05 NOTE — Addendum Note (Signed)
Addended by: Rennie Natter on: 09/05/2021 05:38 PM ? ? Modules accepted: Orders ? ?

## 2021-09-13 DIAGNOSIS — Z1329 Encounter for screening for other suspected endocrine disorder: Secondary | ICD-10-CM | POA: Diagnosis not present

## 2021-09-13 DIAGNOSIS — Z6841 Body Mass Index (BMI) 40.0 and over, adult: Secondary | ICD-10-CM | POA: Diagnosis not present

## 2021-09-13 DIAGNOSIS — N911 Secondary amenorrhea: Secondary | ICD-10-CM | POA: Diagnosis not present

## 2021-09-13 DIAGNOSIS — Z13228 Encounter for screening for other metabolic disorders: Secondary | ICD-10-CM | POA: Diagnosis not present

## 2021-09-13 DIAGNOSIS — Z01419 Encounter for gynecological examination (general) (routine) without abnormal findings: Secondary | ICD-10-CM | POA: Diagnosis not present

## 2021-09-13 DIAGNOSIS — Z124 Encounter for screening for malignant neoplasm of cervix: Secondary | ICD-10-CM | POA: Diagnosis not present

## 2021-09-13 DIAGNOSIS — Z0000 Encounter for general adult medical examination without abnormal findings: Secondary | ICD-10-CM | POA: Diagnosis not present

## 2021-09-13 DIAGNOSIS — R7309 Other abnormal glucose: Secondary | ICD-10-CM | POA: Diagnosis not present

## 2021-09-21 ENCOUNTER — Encounter (HOSPITAL_BASED_OUTPATIENT_CLINIC_OR_DEPARTMENT_OTHER): Payer: Self-pay | Admitting: Physical Therapy

## 2021-09-21 ENCOUNTER — Ambulatory Visit (HOSPITAL_BASED_OUTPATIENT_CLINIC_OR_DEPARTMENT_OTHER): Payer: Medicaid Other | Attending: Family Medicine | Admitting: Physical Therapy

## 2021-09-21 DIAGNOSIS — G8929 Other chronic pain: Secondary | ICD-10-CM | POA: Insufficient documentation

## 2021-09-21 DIAGNOSIS — R262 Difficulty in walking, not elsewhere classified: Secondary | ICD-10-CM | POA: Insufficient documentation

## 2021-09-21 DIAGNOSIS — M6281 Muscle weakness (generalized): Secondary | ICD-10-CM | POA: Insufficient documentation

## 2021-09-21 DIAGNOSIS — Z1231 Encounter for screening mammogram for malignant neoplasm of breast: Secondary | ICD-10-CM | POA: Diagnosis not present

## 2021-09-21 DIAGNOSIS — R252 Cramp and spasm: Secondary | ICD-10-CM | POA: Insufficient documentation

## 2021-09-21 DIAGNOSIS — M5442 Lumbago with sciatica, left side: Secondary | ICD-10-CM | POA: Insufficient documentation

## 2021-09-21 DIAGNOSIS — M5441 Lumbago with sciatica, right side: Secondary | ICD-10-CM | POA: Insufficient documentation

## 2021-09-21 NOTE — Therapy (Signed)
-[place] ?[place] [address] ?[place], [place], [address] ?Phone: [phone]   Fax:  [phone] ? ?Physical Therapy Re-evaluation ? ?Patient Details  ?Name: Ariana Parks ?MRN: [id number] ?Date of Birth: [birth year] ?Referring Provider (PT): Luetta Nutting DO ? ? ?Encounter Date: 09/21/2021 ? ? PT End of Session - 09/21/21 1459   ? ? Visit Number 9   ? Number of Visits 16   ? Date for PT Re-Evaluation 10/31/21   ? Authorization Type [facility] Medicaid   ? PT Start Time 0867   ? PT Stop Time 1530   ? PT Time Calculation (min) 33 min   ? Activity Tolerance Patient limited by pain;Patient tolerated treatment well   ? ?  ?  ? ?  ? ? ?Past Medical History:  ?Diagnosis Date  ? Abnormal Pap smear of cervix 2005  ? Mod dysplasia  ? Depression   ? Endometriosis   ? GERD (gastroesophageal reflux disease)   ? Gestational diabetes mellitus (GDM) affecting first pregnancy   ? High cholesterol   ? Nonocclusive mesenteric ischemia ([place]) 04/04/2018  ? Dx colonscopy Aug 2019 GAP  ? OSA (obstructive sleep apnea) 07/05/2016  ? Panic attack   ? PCOS (polycystic ovarian syndrome)   ? ? ?Past Surgical History:  ?Procedure Laterality Date  ? CESAREAN SECTION N/A 07/24/2020  ? Procedure: PRIMARY CESAREAN SECTION;  Surgeon: Everlene Farrier, MD;  Location: [facility] LD ORS;  Service: Obstetrics;  Laterality: N/A;  ? KNEE SURGERY    ? LEEP    ? TONSILLECTOMY    ? WISDOM TOOTH EXTRACTION    ? ? ?There were no vitals filed for this visit. ? ? ? Subjective Assessment - 09/21/21 1501   ? ? Subjective Pain 4/10 LB.  Has been out due to mutiple illnesses.  Ready to get back.   ? Currently in Pain? Yes   ? Pain Score 4    ? Pain Onset More than a month ago   ? Pain Frequency Constant   ? ?  ?  ? ?  ? ? ? ? ? ? ? ? ? ? ? ? ? ? ? ? ? ? ? ?Objective measurements completed on examination: See above findings.  ? ? ? ? ? ? ? ? ? ? ? ? ? ? ? ? ? ? PT Short Term Goals - 09/05/21 1625   ? ?  ? PT SHORT TERM GOAL  #1  ? Title Independent with initial HEP   ? Time 2   ? Period Weeks   ? Status Achieved   ? Target Date 06/19/21   ? ?  ?  ? ?  ? ? ? ? PT Long Term Goals - 09/05/21 1625   ? ?  ? PT LONG TERM GOAL #1  ? Title Pt. will be independent with progressed HEP for core strengthening to improve outcomes.   ? Time 6   ? Period Weeks   ? Status On-going   ? Target Date 10/31/21   ?  ? PT LONG TERM GOAL #2  ? Title Pt. will be able to tolerate standing/walking for 20 minutes without increased pain/numbness.   ? Baseline 10-15 minutes with bil LE numbness and increased pain.   ? Time 6   ? Period Weeks   ? Status On-going   09/05/21- reports improved tolerance to standing and walking overall, but still <15 min  ? Target Date 10/31/21   ?  ? PT LONG TERM  GOAL #3  ? Title Patient will be able to lift 20lbs without increased LBP to care for daughter.   ? Baseline increased LBP with lifting   ? Time 6   ? Period Weeks   ? Status Achieved   ? Target Date 10/31/21   ?  ? PT LONG TERM GOAL #4  ? Title Pt. will improve FOTO to 48% to demonstrate improved physical functioning.   ? Baseline 32%   ? Time 6   ? Period Weeks   ? Status On-going   09/05/21- 35%, no significant change.  ? Target Date 10/31/21   ?  ? PT LONG TERM GOAL #5  ? Title Pt. will report 50% improvement in LBP and radicular symptoms.   ? Time 6   ? Period Weeks   ? Status On-going   09/05/21- 10% improvement  ? Target Date 10/31/21   ? ?  ?  ? ?  ? ?  ?Pt seen for aquatic therapy today.  Treatment took place in water 3.25-4.8 ft in depth at the [employer] pool. Temp of water was 93?Marland Kitchen  Pt entered/exited the pool via stairs (step through pattern) independently with bilat rail. ?  ?  ?Warm up walking fwd, bkw and side stepping x 5 minutes;  ? LB stretch using kick board ? Hip hinges alternated with lumbar rotation 2x10 ?Kick board push pull 3x20 feet together then tandem ?Ant-posterior pelvic tilts, hip hiking and pelvic rotation (CW, CCW) sitting on yellow  noodle ?Plank on step hip extension x10 ?LB stretch in suspended prone ?-forward to back pendulum x5 ?-side to side pendulum x 5 ? ?Pt requires buoyancy for support and to offload joints with strengthening exercises. Viscosity of the water is needed for resistance of strengthening; water current perturbations provides challenge to standing balance unsupported, requiring increased core activation. ? ? ? ? ? ? ? Plan - 09/21/21 1512   ? ? Clinical Impression Statement Reviewed abdominal bracing and glut tightening for core stabilization with completion of exercises. Moderate cues given for exerises.  She has  difficulty maintaining tandem stance as well as executing penduums demonstrating Slight loss of core strength in 6 weeks of hiatus.  Did achieve good core stretch in positions. Will benefit from aquatic therapy and progress to meetin all goals.   ? Comorbidities chronic LBP, C-section, PCOS, obesity, GERD, anxiety/depression, bil knee surgery (lateral release)   ? Examination-Activity Limitations Bed Mobility;Bend;Caring for Others;Carry;Locomotion Level;Stairs;Stand;Squat;Sit;Lift;Dressing   ? Examination-Participation Restrictions Interpersonal Relationship;Cleaning;Community Activity;Driving   ? Stability/Clinical Decision Making Evolving/Moderate complexity   ? Clinical Decision Making Moderate   ? Rehab Potential Good   ? PT Frequency Other (comment)   ? PT Duration 8 weeks   ? PT Treatment/Interventions Aquatic Therapy;ADLs/Self Care Home Management;Cryotherapy;Electrical Stimulation;Moist Heat;Ultrasound;Gait training;Stair training;Functional mobility training;Therapeutic activities;Therapeutic exercise;Balance training;Neuromuscular re-education;Patient/family education;Manual techniques;Passive range of motion;Dry needling;Taping;Spinal Manipulations;Joint Manipulations   ? PT Next Visit Plan continue core strengthening as tolerated.   ? ?  ?  ? ?  ? ? ? ?Patient will benefit from skilled therapeutic  intervention in order to improve the following deficits and impairments:  Decreased endurance, Decreased mobility, Difficulty walking, Increased muscle spasms, Impaired sensation, Decreased range of motion, Impaired perceived functional ability, Decreased activity tolerance, Decreased strength, Increased fascial restricitons, Impaired flexibility, Postural dysfunction, Pain ? ?Visit Diagnosis: ?Chronic midline low back pain with bilateral sciatica ? ?Cramp and spasm ? ?Difficulty in walking, not elsewhere classified ? ?Muscle weakness (generalized) ? ? ? ? ?Problem List ?  Patient Active Problem List  ? Diagnosis Date Noted  ? Abnormal weight gain 05/02/2021  ? Chronic bilateral low back pain without sciatica 05/02/2021  ? Lumbar paraspinal muscle spasm 12/08/2020  ? COVID-19 06/16/2020  ? Gestational diabetes mellitus (GDM), antepartum 05/23/2020  ? Pain, dental 04/19/2020  ? GERD (gastroesophageal reflux disease) 03/24/2020  ? Nonocclusive mesenteric ischemia ([place]) 04/04/2018  ? DUB (dysfunctional uterine bleeding) 03/14/2017  ? Prediabetes 02/14/2017  ? OSA (obstructive sleep apnea) 07/05/2016  ? Morbid obesity ([place]) 07/05/2016  ? PCOS (polycystic ovarian syndrome) 05/31/2015  ? Abnormal EEG 01/02/2012  ? ADD (attention deficit disorder) 01/02/2012  ? Anxiety 01/02/2012  ? MDD (major depressive disorder) 01/02/2012  ? Current tobacco use 01/02/2012  ? ? ?Stanton Kidney Tharon Aquas) Jasiri Hanawalt MPT ?09/21/2021, 6:11 PM ? ?[place] ?[address] ?[address] ?[place], [place], [address] ?Phone: [phone]   Fax:  [phone] ? ?Name: Ariana Parks ?MRN: [id number] ?Date of Birth: 1981/04/13 ? ? ?

## 2021-09-28 ENCOUNTER — Encounter (HOSPITAL_BASED_OUTPATIENT_CLINIC_OR_DEPARTMENT_OTHER): Payer: Self-pay | Admitting: Physical Therapy

## 2021-09-28 ENCOUNTER — Ambulatory Visit (HOSPITAL_BASED_OUTPATIENT_CLINIC_OR_DEPARTMENT_OTHER): Payer: Medicaid Other | Attending: Family Medicine | Admitting: Physical Therapy

## 2021-09-28 DIAGNOSIS — M6281 Muscle weakness (generalized): Secondary | ICD-10-CM | POA: Insufficient documentation

## 2021-09-28 DIAGNOSIS — M5441 Lumbago with sciatica, right side: Secondary | ICD-10-CM | POA: Diagnosis present

## 2021-09-28 DIAGNOSIS — R262 Difficulty in walking, not elsewhere classified: Secondary | ICD-10-CM | POA: Diagnosis present

## 2021-09-28 DIAGNOSIS — G8929 Other chronic pain: Secondary | ICD-10-CM | POA: Insufficient documentation

## 2021-09-28 DIAGNOSIS — M5442 Lumbago with sciatica, left side: Secondary | ICD-10-CM | POA: Insufficient documentation

## 2021-09-28 DIAGNOSIS — R252 Cramp and spasm: Secondary | ICD-10-CM | POA: Insufficient documentation

## 2021-09-28 NOTE — Therapy (Signed)
-[facility] ?[place] [address] ?[place], [place], [address] ?Phone: [phone]   Fax:  [phone] ? ?Physical Therapy Re-evaluation ? ?Patient Details  ?Name: Ariana Parks ?MRN: [id number] ?Date of Birth: 1980-12-24 ?Referring Provider (PT): Luetta Nutting DO ? ? ?Encounter Date: 09/28/2021 ? ? PT End of Session - 09/28/21 1504   ? ? Visit Number 10   ? Number of Visits 16   ? Date for PT Re-Evaluation 10/31/21   ? Authorization Type [facility] Medicaid   ? PT Start Time 1500   ? PT Stop Time 4268   ? PT Time Calculation (min) 32 min   ? Activity Tolerance Patient limited by pain;Patient tolerated treatment well   ? ?  ?  ? ?  ? ? ?Past Medical History:  ?Diagnosis Date  ? Abnormal Pap smear of cervix 2005  ? Mod dysplasia  ? Depression   ? Endometriosis   ? GERD (gastroesophageal reflux disease)   ? Gestational diabetes mellitus (GDM) affecting first pregnancy   ? High cholesterol   ? Nonocclusive mesenteric ischemia ([place]) 04/04/2018  ? Dx colonscopy Aug 2019 GAP  ? OSA (obstructive sleep apnea) 07/05/2016  ? Panic attack   ? PCOS (polycystic ovarian syndrome)   ? ? ?Past Surgical History:  ?Procedure Laterality Date  ? CESAREAN SECTION N/A 07/24/2020  ? Procedure: PRIMARY CESAREAN SECTION;  Surgeon: Everlene Farrier, MD;  Location: [facility];  Service: Obstetrics;  Laterality: N/A;  ? KNEE SURGERY    ? LEEP    ? TONSILLECTOMY    ? WISDOM TOOTH EXTRACTION    ? ? ?There were no vitals filed for this visit. ? ? ? Subjective Assessment - 09/28/21 1833   ? ? Subjective "pains about the same"   ? Pertinent History history chronic LBP, C-section, GERD, OSA, PCOS, anxiety   ? Currently in Pain? Yes   ? Pain Score 4    ? Pain Location Back   ? Pain Orientation Lower   ? Pain Descriptors / Indicators Spasm   ? Pain Type Chronic pain   ? Pain Onset More than a month ago   ? Pain Frequency Constant   ? ?  ?  ? ?  ? ? ? ? ? ? ? ? ? ? ? ? ? ? ? ? ? ? ? ? ?Objective measurements  completed on examination: See above findings.  ? ? ? ? ? ? ? ? ? ? ? ? ? ? ? ? ? ? PT Short Term Goals - 09/05/21 1625   ? ?  ? PT SHORT TERM GOAL #1  ? Title Independent with initial HEP   ? Time 2   ? Period Weeks   ? Status Achieved   ? Target Date 06/19/21   ? ?  ?  ? ?  ? ? ? ? PT Long Term Goals - 09/05/21 1625   ? ?  ? PT LONG TERM GOAL #1  ? Title Pt. will be independent with progressed HEP for core strengthening to improve outcomes.   ? Time 6   ? Period Weeks   ? Status On-going   ? Target Date 10/31/21   ?  ? PT LONG TERM GOAL #2  ? Title Pt. will be able to tolerate standing/walking for 20 minutes without increased pain/numbness.   ? Baseline 10-15 minutes with bil LE numbness and increased pain.   ? Time 6   ? Period Weeks   ? Status  On-going   09/05/21- reports improved tolerance to standing and walking overall, but still <15 min  ? Target Date 10/31/21   ?  ? PT LONG TERM GOAL #3  ? Title Patient will be able to lift 20lbs without increased LBP to care for daughter.   ? Baseline increased LBP with lifting   ? Time 6   ? Period Weeks   ? Status Achieved   ? Target Date 10/31/21   ?  ? PT LONG TERM GOAL #4  ? Title Pt. will improve FOTO to 48% to demonstrate improved physical functioning.   ? Baseline 32%   ? Time 6   ? Period Weeks   ? Status On-going   09/05/21- 35%, no significant change.  ? Target Date 10/31/21   ?  ? PT LONG TERM GOAL #5  ? Title Pt. will report 50% improvement in LBP and radicular symptoms.   ? Time 6   ? Period Weeks   ? Status On-going   09/05/21- 10% improvement  ? Target Date 10/31/21   ? ?  ?  ? ?  ? ?  ?Pt seen for aquatic therapy today.  Treatment took place in water 3.25-4.8 ft in depth at the [facility] pool. Temp of water was 91?.  Pt entered/exited the pool via stairs step through pattern independently with bilat rail. ?  ?  ?Warm up walking fwd, bkw and side stepping x 5 minutes;  ?LB stretch in suspended prone, sup and side lying (QL) ? -forward to back  pendulum 2x5 ? -side to side pendulum 2 x 5 ?Plank on step hip extension 2x10 ? ?Lumbar rotation x 10 ?Ant-posterior pelvic tilts, hip hiking and pelvic rotation (CW, CCW) sitting on yellow noodle ? ?Yellow hand bout push down for core engagement x10 ? -Sustained hold isometrically 5x20s, alternated with lumbar rotation 2x10 ?Kick board push pull 2x10 tandem stance R/L ? ? ?Pt requires buoyancy for support and to offload joints with strengthening exercises. Viscosity of the water is needed for resistance of strengthening; water current perturbations provides challenge to standing balance unsupported, requiring increased core activation. ? ? ? ? ? ? ? Plan - 09/28/21 1533   ? ? Clinical Impression Statement Pt pain remains same from beginning of session to end although tolerating increased core strengthening and stretching.  She is late again for session limiting time. Requires cues for execution of pendulum exercises demonstrating improved strength in core with successful completion   ? Personal Factors and Comorbidities Comorbidity 3+   ? Comorbidities chronic LBP, C-section, PCOS, obesity, GERD, anxiety/depression, bil knee surgery (lateral release)   ? Examination-Activity Limitations Bed Mobility;Bend;Caring for Others;Carry;Locomotion Level;Stairs;Stand;Squat;Sit;Lift;Dressing   ? Examination-Participation Restrictions Interpersonal Relationship;Cleaning;Community Activity;Driving   ? Stability/Clinical Decision Making Evolving/Moderate complexity   ? Clinical Decision Making Moderate   ? Rehab Potential Good   ? PT Frequency Other (comment)   ? PT Duration 8 weeks   ? PT Treatment/Interventions Aquatic Therapy;ADLs/Self Care Home Management;Cryotherapy;Electrical Stimulation;Moist Heat;Ultrasound;Gait training;Stair training;Functional mobility training;Therapeutic activities;Therapeutic exercise;Balance training;Neuromuscular re-education;Patient/family education;Manual techniques;Passive range of motion;Dry  needling;Taping;Spinal Manipulations;Joint Manipulations   ? PT Next Visit Plan continue core strengthening as tolerated.   ? PT Home Exercise Plan Access Code: [email or web address];  Access Code: [email or web address] (09/05/21)   ? ?  ?  ? ?  ? ? ? ? ?Patient will benefit from skilled therapeutic intervention in order to improve the following deficits and impairments:  Decreased endurance, Decreased mobility, Difficulty walking, Increased muscle spasms, Impaired sensation,  Decreased range of motion, Impaired perceived functional ability, Decreased activity tolerance, Decreased strength, Increased fascial restricitons, Impaired flexibility, Postural dysfunction, Pain ? ?Visit Diagnosis: ?Chronic midline low back pain with bilateral sciatica ? ?Cramp and spasm ? ?Difficulty in walking, not elsewhere classified ? ?Muscle weakness (generalized) ? ? ? ? ?Problem List ?Patient Active Problem List  ? Diagnosis Date Noted  ? Abnormal weight gain 05/02/2021  ? Chronic bilateral low back pain without sciatica 05/02/2021  ? Lumbar paraspinal muscle spasm 12/08/2020  ? COVID-19 06/16/2020  ? Gestational diabetes mellitus (GDM), antepartum 05/23/2020  ? Pain, dental 04/19/2020  ? GERD (gastroesophageal reflux disease) 03/24/2020  ? Nonocclusive mesenteric ischemia ([place]) 04/04/2018  ? DUB (dysfunctional uterine bleeding) 03/14/2017  ? Prediabetes 02/14/2017  ? OSA (obstructive sleep apnea) 07/05/2016  ? Morbid obesity ([place]) 07/05/2016  ? PCOS (polycystic ovarian syndrome) 05/31/2015  ? Abnormal EEG 01/02/2012  ? ADD (attention deficit disorder) 01/02/2012  ? Anxiety 01/02/2012  ? MDD (major depressive disorder) 01/02/2012  ? Current tobacco use 01/02/2012  ? ? ?Stanton Kidney Tharon Aquas) Jacquelynn Friend MPT ?09/28/2021, 6:40 PM ? ?[facility] ?[address] ?[place], [place], [address] ?Phone: [phone]   Fax:  [phone] ? ?Name: Barbette Mcglaun ?MRN: [id number] ?Date of Birth: 06-10-1981 ? ? ?

## 2021-10-11 DIAGNOSIS — G4710 Hypersomnia, unspecified: Secondary | ICD-10-CM | POA: Diagnosis not present

## 2021-10-11 DIAGNOSIS — Z6841 Body Mass Index (BMI) 40.0 and over, adult: Secondary | ICD-10-CM | POA: Diagnosis not present

## 2021-10-11 DIAGNOSIS — G4733 Obstructive sleep apnea (adult) (pediatric): Secondary | ICD-10-CM | POA: Diagnosis not present

## 2021-10-12 ENCOUNTER — Ambulatory Visit (HOSPITAL_BASED_OUTPATIENT_CLINIC_OR_DEPARTMENT_OTHER): Payer: Medicaid Other | Admitting: Physical Therapy

## 2021-10-12 ENCOUNTER — Encounter (HOSPITAL_BASED_OUTPATIENT_CLINIC_OR_DEPARTMENT_OTHER): Payer: Self-pay | Admitting: Physical Therapy

## 2021-10-12 DIAGNOSIS — R252 Cramp and spasm: Secondary | ICD-10-CM

## 2021-10-12 DIAGNOSIS — M6281 Muscle weakness (generalized): Secondary | ICD-10-CM

## 2021-10-12 DIAGNOSIS — M5441 Lumbago with sciatica, right side: Secondary | ICD-10-CM | POA: Diagnosis not present

## 2021-10-12 DIAGNOSIS — R262 Difficulty in walking, not elsewhere classified: Secondary | ICD-10-CM

## 2021-10-12 DIAGNOSIS — G8929 Other chronic pain: Secondary | ICD-10-CM

## 2021-10-12 NOTE — Therapy (Signed)
-[facility] ?[place] ?[address] ?[place], [place], [address] ?Phone: [phone]   Fax:  [phone] ? ?Physical Therapy Re-evaluation ? ?Patient Details  ?Name: Ariana Parks ?MRN: [id number] ?Date of Birth: 03-24-1981 ?Referring Provider (PT): Luetta Nutting DO ? ? ?Encounter Date: 10/12/2021 ? ? PT End of Session - 10/12/21 1451   ? ? Visit Number 11   ? Number of Visits 16   ? Date for PT Re-Evaluation 10/31/21   ? Authorization Type [facility] Medicaid   ? PT Start Time 3893   ? PT Stop Time 1530   ? PT Time Calculation (min) 45 min   ? Activity Tolerance Patient limited by pain;Patient tolerated treatment well   ? ?  ?  ? ?  ? ? ? ?Past Medical History:  ?Diagnosis Date  ? Abnormal Pap smear of cervix 2005  ? Mod dysplasia  ? Depression   ? Endometriosis   ? GERD (gastroesophageal reflux disease)   ? Gestational diabetes mellitus (GDM) affecting first pregnancy   ? High cholesterol   ? Nonocclusive mesenteric ischemia ([place]) 04/04/2018  ? Dx colonscopy Aug 2019 GAP  ? OSA (obstructive sleep apnea) 07/05/2016  ? Panic attack   ? PCOS (polycystic ovarian syndrome)   ? ? ?Past Surgical History:  ?Procedure Laterality Date  ? CESAREAN SECTION N/A 07/24/2020  ? Procedure: PRIMARY CESAREAN SECTION;  Surgeon: Everlene Farrier, MD;  Location: [facility];  Service: Obstetrics;  Laterality: N/A;  ? KNEE SURGERY    ? LEEP    ? TONSILLECTOMY    ? WISDOM TOOTH EXTRACTION    ? ? ?There were no vitals filed for this visit. ? ? ? Subjective Assessment - 10/12/21 1658   ? ? Subjective "doing OK"   ? ?  ?  ? ?  ? ? ? ? ? ? ? ? ? ? ? ? ? ? ? ? ? ? ? ? ? ? ? ? ? ? ? ? ? ? ? ? ? ? ? ? ? ? PT Short Term Goals - 09/05/21 1625   ? ?  ? PT SHORT TERM GOAL #1  ? Title Independent with initial HEP   ? Time 2   ? Period Weeks   ? Status Achieved   ? Target Date 06/19/21   ? ?  ?  ? ?  ? ? ? ? PT Long Term Goals - 09/05/21 1625   ? ?  ? PT LONG TERM GOAL #1  ? Title Pt. will be independent with  progressed HEP for core strengthening to improve outcomes.   ? Time 6   ? Period Weeks   ? Status On-going   ? Target Date 10/31/21   ?  ? PT LONG TERM GOAL #2  ? Title Pt. will be able to tolerate standing/walking for 20 minutes without increased pain/numbness.   ? Baseline 10-15 minutes with bil LE numbness and increased pain.   ? Time 6   ? Period Weeks   ? Status On-going   09/05/21- reports improved tolerance to standing and walking overall, but still <15 min  ? Target Date 10/31/21   ?  ? PT LONG TERM GOAL #3  ? Title Patient will be able to lift 20lbs without increased LBP to care for daughter.   ? Baseline increased LBP with lifting   ? Time 6   ? Period Weeks   ? Status Achieved   ? Target Date 10/31/21   ?  ?  PT LONG TERM GOAL #4  ? Title Pt. will improve FOTO to 48% to demonstrate improved physical functioning.   ? Baseline 32%   ? Time 6   ? Period Weeks   ? Status On-going   09/05/21- 35%, no significant change.  ? Target Date 10/31/21   ?  ? PT LONG TERM GOAL #5  ? Title Pt. will report 50% improvement in LBP and radicular symptoms.   ? Time 6   ? Period Weeks   ? Status On-going   09/05/21- 10% improvement  ? Target Date 10/31/21   ? ?  ?  ? ?  ? ?  ?Pt seen for aquatic therapy today.  Treatment took place in water 3.25-4.8 ft in depth at the [facility] pool. Temp of water was 91?.  Pt entered/exited the pool via stairs step through pattern independently with bilat rail. ?  ?  ?Warm up walking fwd, bkw and side stepping no UE support ?Ant-posterior pelvic tilts, hip hiking and pelvic rotation (CW, CCW) sitting on yellow noodle ?Lumbar stretch  into flex using kick board ?-lumbar rotation x10 ?QL stretch in side lying (using blue hand buoys) R/L 20s hold x3 ea ? -side to side pendulum 2 x 8 ?Suspended prone using blue hand buoys LB stretch 3x20s hold ?Suspended supine abdominal "crunch" 2x10 ?-forward to back pendulum 2x5 ?-flys 3x5. Yellow hand buoys ?Resisted core rotation R/L x10ea ?Plank on  yellow noodle 2x 30 s hold ?-plank with ue pull down x 5 with yellow noodle then x5 with blue noodle ? ? ?Pt requires buoyancy for support and to offload joints with strengthening exercises. Viscosity of the water is needed for resistance of strengthening; water current perturbations provides challenge to standing balance unsupported, requiring increased core activation. ? ? ? ? ? ? ? Plan - 10/12/21 1659   ? ? Clinical Impression Statement Progressed core strengthening. Pt with improved core stabilization and control with core activities (pendulums) as evidenced by increased reps tolerated and improved execution.  Added to abdominal crunches in suspended supine which pt had difficulty executing in prior sessions.  Pt scheduled for last aquatics session next week.  Anticipate that she will be ready for DC.   ? Stability/Clinical Decision Making Evolving/Moderate complexity   ? Clinical Decision Making Moderate   ? Rehab Potential Good   ? PT Frequency Other (comment)   ? PT Duration 8 weeks   ? PT Treatment/Interventions Aquatic Therapy;ADLs/Self Care Home Management;Cryotherapy;Electrical Stimulation;Moist Heat;Ultrasound;Gait training;Stair training;Functional mobility training;Therapeutic activities;Therapeutic exercise;Balance training;Neuromuscular re-education;Patient/family education;Manual techniques;Passive range of motion;Dry needling;Taping;Spinal Manipulations;Joint Manipulations   ? PT Next Visit Plan DC from aquatics   ? PT Home Exercise Plan Access Code: [email or web address];  Access Code: [email or web address] (09/05/21) Added aquatics 10/12/21 printed, laminated.   ? ?  ?  ? ?  ? ? ? ? ? ?Patient will benefit from skilled therapeutic intervention in order to improve the following deficits and impairments:  Decreased endurance, Decreased mobility, Difficulty walking, Increased muscle spasms, Impaired sensation, Decreased range of motion, Impaired perceived functional ability, Decreased activity tolerance, Decreased strength,  Increased fascial restricitons, Impaired flexibility, Postural dysfunction, Pain ? ?Visit Diagnosis: ?Chronic midline low back pain with bilateral sciatica ? ?Cramp and spasm ? ?Difficulty in walking, not elsewhere classified ? ?Muscle weakness (generalized) ? ? ? ? ?Problem List ?Patient Active Problem List  ? Diagnosis Date Noted  ? Abnormal weight gain 05/02/2021  ? Chronic bilateral low back pain without sciatica 05/02/2021  ? Lumbar paraspinal muscle  spasm 12/08/2020  ? COVID-19 06/16/2020  ? Gestational diabetes mellitus (GDM), antepartum 05/23/2020  ? Pain, dental 04/19/2020  ? GERD (gastroesophageal reflux disease) 03/24/2020  ? Nonocclusive mesenteric ischemia ([place]) 04/04/2018  ? DUB (dysfunctional uterine bleeding) 03/14/2017  ? Prediabetes 02/14/2017  ? OSA (obstructive sleep apnea) 07/05/2016  ? Morbid obesity ([place]) 07/05/2016  ? PCOS (polycystic ovarian syndrome) 05/31/2015  ? Abnormal EEG 01/02/2012  ? ADD (attention deficit disorder) 01/02/2012  ? Anxiety 01/02/2012  ? MDD (major depressive disorder) 01/02/2012  ? Current tobacco use 01/02/2012  ? ? ?Stanton Kidney Tharon Aquas) Albion Weatherholtz MPT ?10/12/2021, 5:17 PM ? ?[place] ?[address] ?[address] ?[place], [place], [address] ?Phone: [phone]   Fax:  [phone] ? ?Name: Ariana Parks ?MRN: [id number] ?Date of Birth: [birth year] ? ? ?

## 2021-10-19 ENCOUNTER — Encounter (HOSPITAL_BASED_OUTPATIENT_CLINIC_OR_DEPARTMENT_OTHER): Payer: Self-pay | Admitting: Physical Therapy

## 2021-10-19 ENCOUNTER — Ambulatory Visit (HOSPITAL_BASED_OUTPATIENT_CLINIC_OR_DEPARTMENT_OTHER): Payer: Medicaid Other | Admitting: Physical Therapy

## 2021-10-19 DIAGNOSIS — M5441 Lumbago with sciatica, right side: Secondary | ICD-10-CM | POA: Diagnosis not present

## 2021-10-19 DIAGNOSIS — R262 Difficulty in walking, not elsewhere classified: Secondary | ICD-10-CM

## 2021-10-19 DIAGNOSIS — R252 Cramp and spasm: Secondary | ICD-10-CM

## 2021-10-19 DIAGNOSIS — M6281 Muscle weakness (generalized): Secondary | ICD-10-CM

## 2021-10-19 DIAGNOSIS — G8929 Other chronic pain: Secondary | ICD-10-CM

## 2021-10-19 NOTE — Therapy (Signed)
-[facility] ?[place] ?[address] ?[place], [place], [address] ?Phone: [phone]   Fax:  [phone] ? ?Physical Therapy Re-evaluation ? ?Patient Details  ?Name: Ariana Parks ?MRN: [id number] ?Date of Birth: 09-08-1980 ?Referring Provider (PT): Luetta Nutting DO ? ? ?Encounter Date: 10/19/2021 ? ? PT End of Session - 10/19/21 1505   ? ? Visit Number 12   ? Number of Visits 16   ? Date for PT Re-Evaluation 10/31/21   ? Authorization Type [facility] Medicaid   ? PT Start Time 1450   ? PT Stop Time 1530   ? PT Time Calculation (min) 40 min   ? Activity Tolerance Patient limited by pain;Patient tolerated treatment well   ? ?  ?  ? ?  ? ? ? ?Past Medical History:  ?Diagnosis Date  ? Abnormal Pap smear of cervix 2005  ? Mod dysplasia  ? Depression   ? Endometriosis   ? GERD (gastroesophageal reflux disease)   ? Gestational diabetes mellitus (GDM) affecting first pregnancy   ? High cholesterol   ? Nonocclusive mesenteric ischemia ([place]) 04/04/2018  ? Dx colonscopy Aug 2019 GAP  ? OSA (obstructive sleep apnea) 07/05/2016  ? Panic attack   ? PCOS (polycystic ovarian syndrome)   ? ? ?Past Surgical History:  ?Procedure Laterality Date  ? CESAREAN SECTION N/A 07/24/2020  ? Procedure: PRIMARY CESAREAN SECTION;  Surgeon: Everlene Farrier, MD;  Location: [facility];  Service: Obstetrics;  Laterality: N/A;  ? KNEE SURGERY    ? LEEP    ? TONSILLECTOMY    ? WISDOM TOOTH EXTRACTION    ? ? ?There were no vitals filed for this visit. ? ? ? Subjective Assessment - 10/19/21 1510   ? ? Subjective Last visit.  "Almost fell out OOB other night and strined my back a little   ? Pain Score 5    ? Pain Location Back   ? Pain Orientation Lower   ? Pain Descriptors / Indicators Aching   ? Pain Type Chronic pain   ? Pain Onset More than a month ago   ? ?  ?  ? ?  ? ? ? ? ? ? ? ? ? ? ? ? ? ? ? ? ? ? ? ? ? ? ? ? ? ? ? ? ? ? ? ? ? ? ? ? ? ? ? PT Short Term Goals - 09/05/21 1625   ? ?  ? PT SHORT TERM GOAL  #1  ? Title Independent with initial HEP   ? Time 2   ? Period Weeks   ? Status Achieved   ? Target Date 06/19/21   ? ?  ?  ? ?  ? ? ? ? PT Long Term Goals - 09/05/21 1625   ? ?  ? PT LONG TERM GOAL #1  ? Title Pt. will be independent with progressed HEP for core strengthening to improve outcomes.   ? Time 6   ? Period Weeks   ? Status On-going   ? Target Date 10/31/21   ?  ? PT LONG TERM GOAL #2  ? Title Pt. will be able to tolerate standing/walking for 20 minutes without increased pain/numbness.   ? Baseline 10-15 minutes with bil LE numbness and increased pain.   ? Time 6   ? Period Weeks   ? Status On-going   09/05/21- reports improved tolerance to standing and walking overall, but still <15 min  ? Target Date 10/31/21   ?  ?  PT LONG TERM GOAL #3  ? Title Patient will be able to lift 20lbs without increased LBP to care for daughter.   ? Baseline increased LBP with lifting   ? Time 6   ? Period Weeks   ? Status Achieved   ? Target Date 10/31/21   ?  ? PT LONG TERM GOAL #4  ? Title Pt. will improve FOTO to 48% to demonstrate improved physical functioning.   ? Baseline 32%   ? Time 6   ? Period Weeks   ? Status On-going   09/05/21- 35%, no significant change.  ? Target Date 10/31/21   ?  ? PT LONG TERM GOAL #5  ? Title Pt. will report 50% improvement in LBP and radicular symptoms.   ? Time 6   ? Period Weeks   ? Status On-going   09/05/21- 10% improvement  ? Target Date 10/31/21   ? ?  ?  ? ?  ? ?  ?Pt seen for aquatic therapy today.  Treatment took place in water 3.25-4.8 ft in depth at the [facility] pool. Temp of water was 91?.  Pt entered/exited the pool via stairs step through pattern independently with bilat rail. ?  ?  ?Warm up walking fwd, bkw and side stepping no UE support ?Ant-posterior pelvic tilts, hip hiking and pelvic rotation (CW, CCW) sitting on yellow noodle ?Lumbar stretch  into flex using kick board ?-lumbar rotation x10 ?QL stretch in side lying (using blue hand buoys) R/L 20s hold x3  ea ? -side to side pendulum x10 ?Suspended prone using blue hand buoys LB stretch 3x20s hold ?Suspended supine abdominal "crunch" 2x10 ?-forward to back pendulum x10 ?Kick board push down x 15 ?Resisted core rotation using kick board R/L x10 ?Kick board push pull. ? ?Self Care: ?Instruction and EDU on HEP, purpose,importance, and benefits. ?-continued management of chronic condition: through movement, rest and meds as prescribed by MD ? ? ?Pt requires buoyancy for support and to offload joints with strengthening exercises. Viscosity of the water is needed for resistance of strengthening; water current perturbations provides challenge to standing balance unsupported, requiring increased core activation. ? ? ? ? ? ? ? Plan - 10/19/21 1525   ? ? Clinical Impression Statement Pt given and directed through laminated HEP.  She completes with skill and understanding with excellent toleration.  Instructed on self care with compliance of program for optimal outcome in strength and management of condition.  She reports she continues to have some numbness and tinfling with prolonged ambulation although aquatic therapy intervention has improved her toleration to activity.   ? Examination-Activity Limitations Bed Mobility;Bend;Caring for Others;Carry;Locomotion Level;Stairs;Stand;Squat;Sit;Lift;Dressing   ? Examination-Participation Restrictions Interpersonal Relationship;Cleaning;Community Activity;Driving   ? Stability/Clinical Decision Making Evolving/Moderate complexity   ? Clinical Decision Making Moderate   ? Rehab Potential Good   ? PT Frequency Other (comment)   ? PT Duration 8 weeks   ? PT Treatment/Interventions Aquatic Therapy;ADLs/Self Care Home Management;Cryotherapy;Electrical Stimulation;Moist Heat;Ultrasound;Gait training;Stair training;Functional mobility training;Therapeutic activities;Therapeutic exercise;Balance training;Neuromuscular re-education;Patient/family education;Manual techniques;Passive range of  motion;Dry needling;Taping;Spinal Manipulations;Joint Manipulations   ? PT Next Visit Plan DC from aquatics   ? PT Home Exercise Plan Access Code: [email or web address];  Access Code: [email or web address] (09/05/21) Added aquatics 10/12/21 printed, laminated.   ? ?  ?  ? ?  ? ? ? ? ? ? ?Patient will benefit from skilled therapeutic intervention in order to improve the following deficits and impairments:  Decreased endurance, Decreased mobility, Difficulty walking, Increased muscle spasms,  Impaired sensation, Decreased range of motion, Impaired perceived functional ability, Decreased activity tolerance, Decreased strength, Increased fascial restricitons, Impaired flexibility, Postural dysfunction, Pain ? ?Visit Diagnosis: ?Chronic midline low back pain with bilateral sciatica ? ?Cramp and spasm ? ?Difficulty in walking, not elsewhere classified ? ?Muscle weakness (generalized) ? ? ? ? ?Problem List ?Patient Active Problem List  ? Diagnosis Date Noted  ? Abnormal weight gain 05/02/2021  ? Chronic bilateral low back pain without sciatica 05/02/2021  ? Lumbar paraspinal muscle spasm 12/08/2020  ? COVID-19 06/16/2020  ? Gestational diabetes mellitus (GDM), antepartum 05/23/2020  ? Pain, dental 04/19/2020  ? GERD (gastroesophageal reflux disease) 03/24/2020  ? Nonocclusive mesenteric ischemia ([place]) 04/04/2018  ? DUB (dysfunctional uterine bleeding) 03/14/2017  ? Prediabetes 02/14/2017  ? OSA (obstructive sleep apnea) 07/05/2016  ? Morbid obesity ([place]) 07/05/2016  ? PCOS (polycystic ovarian syndrome) 05/31/2015  ? Abnormal EEG 01/02/2012  ? ADD (attention deficit disorder) 01/02/2012  ? Anxiety 01/02/2012  ? MDD (major depressive disorder) 01/02/2012  ? Current tobacco use 01/02/2012  ? ? ?Stanton Kidney Tharon Aquas) Jailan Trimm MPT ?10/19/2021, 6:25 PM ? ?[place] ?[address] ?[place], [place], [address] ?Phone: [phone]   Fax:  [phone] ? ?Name: Lynnley Doddridge ?MRN: [id number] ?Date of Birth:  21-Sep-1980 ? ? ?

## 2021-10-22 DIAGNOSIS — J0140 Acute pansinusitis, unspecified: Secondary | ICD-10-CM | POA: Diagnosis not present

## 2021-10-22 DIAGNOSIS — R251 Tremor, unspecified: Secondary | ICD-10-CM | POA: Diagnosis not present

## 2021-10-22 DIAGNOSIS — N6011 Diffuse cystic mastopathy of right breast: Secondary | ICD-10-CM | POA: Diagnosis not present

## 2021-10-23 ENCOUNTER — Encounter: Payer: Self-pay | Admitting: Family Medicine

## 2021-10-26 ENCOUNTER — Ambulatory Visit: Payer: Medicaid Other | Attending: Family Medicine | Admitting: Physical Therapy

## 2021-10-26 ENCOUNTER — Encounter: Payer: Self-pay | Admitting: Physical Therapy

## 2021-10-26 DIAGNOSIS — M5441 Lumbago with sciatica, right side: Secondary | ICD-10-CM | POA: Diagnosis present

## 2021-10-26 DIAGNOSIS — G8929 Other chronic pain: Secondary | ICD-10-CM | POA: Insufficient documentation

## 2021-10-26 DIAGNOSIS — M6281 Muscle weakness (generalized): Secondary | ICD-10-CM | POA: Insufficient documentation

## 2021-10-26 DIAGNOSIS — R252 Cramp and spasm: Secondary | ICD-10-CM | POA: Insufficient documentation

## 2021-10-26 DIAGNOSIS — M5442 Lumbago with sciatica, left side: Secondary | ICD-10-CM | POA: Diagnosis present

## 2021-10-26 DIAGNOSIS — R262 Difficulty in walking, not elsewhere classified: Secondary | ICD-10-CM | POA: Diagnosis present

## 2021-10-26 NOTE — Therapy (Signed)
[facility] ?[address] ?[place], [place], [address] ?Phone: [phone]   Fax:  [phone] ? ?Physical Therapy Re-evaluation/Discharge ? ?PHYSICAL THERAPY DISCHARGE SUMMARY ? ?Visits from Start of Care: 13  ? ?Current functional level related to goals / functional outcomes: ?Slightly improved activity tolerance - see note below.  FOTO 36%.  ?  ?Remaining deficits: ?Continued LBP with radiculopathy ?  ?Education / Equipment: ?HEP  ?Plan: ?Patient agrees to discharge.  Patient goals were not met. Patient is being referred back to MD for further imaging.      ? ? ? ? ?Patient Details  ?Name: Ariana Parks ?MRN: [id number] ?Date of Birth: 1981/05/16 ?Referring Provider (PT): Luetta Nutting DO ? ? ?Encounter Date: 10/26/2021 ? ? PT End of Session - 10/26/21 1412   ? ? Visit Number 13   ? Number of Visits 16   ? Date for PT Re-Evaluation 10/31/21   ? Authorization Type [facility] Medicaid   ? PT Start Time 1410   ? PT Stop Time 1497   ? PT Time Calculation (min) 35 min   ? Activity Tolerance Patient limited by pain;Patient tolerated treatment well   ? ?  ?  ? ?  ? ? ?Past Medical History:  ?Diagnosis Date  ? Abnormal Pap smear of cervix 2005  ? Mod dysplasia  ? Depression   ? Endometriosis   ? GERD (gastroesophageal reflux disease)   ? Gestational diabetes mellitus (GDM) affecting first pregnancy   ? High cholesterol   ? Nonocclusive mesenteric ischemia ([place]) 04/04/2018  ? Dx colonscopy Aug 2019 GAP  ? OSA (obstructive sleep apnea) 07/05/2016  ? Panic attack   ? PCOS (polycystic ovarian syndrome)   ? ? ?Past Surgical History:  ?Procedure Laterality Date  ? CESAREAN SECTION N/A 07/24/2020  ? Procedure: PRIMARY CESAREAN SECTION;  Surgeon: Everlene Farrier, MD;  Location: [facility]S;  Service: Obstetrics;  Laterality: N/A;  ? KNEE SURGERY    ? LEEP    ? TONSILLECTOMY    ? WISDOM TOOTH EXTRACTION    ? ? ?There were no vitals filed for this visit. ? ? Subjective  Assessment - 10/26/21 1413   ? ? Subjective Patient reports she almost fell out of bed and wrenched her back again.  But she is getting hot tub set up, and also looking to get into [employer] membership so can exercise there and get daughter swim lessons.  Also getting larger bed to share with daugher so she doesn't fall out of bed so often.  Also on antibiotics for mastitis.   ? Pertinent History history chronic LBP, C-section, GERD, OSA, PCOS, anxiety   ? Diagnostic tests lumbar IMPRESSION:  No recent fracture is seen. Degenerative changes are noted,  particularly prominent at L1-L2 level   ? Patient Stated Goals help with the pain so I can walk and care for baby, exercise to lose some weight so I can take care of my baby.   ? Currently in Pain? Yes   ? Pain Score 6    ? Pain Location Back   ? Pain Orientation Lower   ? Pain Descriptors / Indicators Aching   ? Pain Onset More than a month ago   ? ?  ?  ? ?  ? ? ? ? ? [facility] PT Assessment - 10/26/21 0001   ? ?  ? Assessment  ? Medical Diagnosis M54.50,G89.29 (ICD-10-CM) - Chronic bilateral low back pain without sciatica   ?  Referring Provider (PT) Luetta Nutting DO   ? Hand Dominance Left   ? Next MD Visit not scheduled   ?  ? Precautions  ? Precautions None   ?  ? Restrictions  ? Weight Bearing Restrictions No   ?  ? Balance Screen  ? Has the patient fallen in the past 6 months Yes   ? How many times? 2   out of bed  ? Has the patient had a decrease in activity level because of a fear of falling?  No   ? Is the patient reluctant to leave their home because of a fear of falling?  No   ?  ? Observation/Other Assessments  ? Focus on Therapeutic Outcomes (FOTO)  36%   ? ?  ?  ? ?  ? ? ? ? ? ? ? ? ? ? ? ? ? ? ? ? [facility] PT Treatment/Exercise - 10/26/21 0001   ? ?  ? Self-Care  ? Self-Care Other Self-Care Comments   ? Other Self-Care Comments  see patient education   ?  ? Lumbar Exercises: Sidelying  ? Clam Right;10 reps   ? Clam Limitations no pain   ? Hip Abduction Right;5  reps   ? Hip Abduction Weights (lbs) increased discomfort   ? ?  ?  ? ?  ? ? ? ? ? ? ? ? ? ? PT Education - 10/26/21 1444   ? ? Education Details HEP update and review Access Code: [email or web address]   ? Person(s) Educated Patient   ? Methods Explanation;Handout;Demonstration   ? Comprehension Verbalized understanding;Returned demonstration   ? ?  ?  ? ?  ? ? ? PT Short Term Goals - 09/05/21 1625   ? ?  ? PT SHORT TERM GOAL #1  ? Title Independent with initial HEP   ? Time 2   ? Period Weeks   ? Status Achieved   ? Target Date 06/19/21   ? ?  ?  ? ?  ? ? ? ? PT Long Term Goals - 10/26/21 1413   ? ?  ? PT LONG TERM GOAL #1  ? Title Pt. will be independent with progressed HEP for core strengthening to improve outcomes.   ? Time 6   ? Period Weeks   ? Status On-going   ? Target Date 10/31/21   ?  ? PT LONG TERM GOAL #2  ? Title Pt. will be able to tolerate standing/walking for 20 minutes without increased pain/numbness.   ? Baseline 10-15 minutes with bil LE numbness and increased pain.   ? Time 6   ? Period Weeks   ? Status Not Met   09/05/21- reports improved tolerance to standing and walking overall, but still <15 min  10/26/21- 15 minutes, but with carrying daughter within 10 minutes both legs go numb.  ? Target Date 10/31/21   ?  ? PT LONG TERM GOAL #3  ? Title Patient will be able to lift 20lbs without increased LBP to care for daughter.   ? Baseline increased LBP with lifting   ? Time 6   ? Period Weeks   ? Status Achieved   ? Target Date 10/31/21   ?  ? PT LONG TERM GOAL #4  ? Title Pt. will improve FOTO to 48% to demonstrate improved physical functioning.   ? Baseline 32%   ? Time 6   ? Period Weeks   ? Status On-going   09/05/21- 35%, no significant  change.  ? Target Date 10/31/21   ?  ? PT LONG TERM GOAL #5  ? Title Pt. will report 50% improvement in LBP and radicular symptoms.   ? Time 6   ? Period Weeks   ? Status Not Met   09/05/21- 10% improvement 10/26/21- abs feel stronger, but no significant improvement in  LBP/radiculopathy.  ? Target Date 10/31/21   ? ?  ?  ? ?  ? ? ? ? ? ? ? ? Plan - 10/26/21 1547   ? ? Clinical Impression Statement [name] reports feeling the aquatic therapy helped improve her core strength significantly, however she does not report any improvement overall in back pain and activity tolerance.  She still has significant increased in pain with supine positioning, limiting land based HEP.  She is performing standing exercises.  Her FOTO has not changed significantly, 36% today.  She continues to have numbness and tingling in bil LE with prolonged ambulation (>10 min) and laying in bed at night.  Based on lack of progress and independence with HEP, she is being discharged from therapy and referred back to MD for further imaging/interventions.  Discussed today options to continue aquatic therapy in community setting, to check out local [employer] and [facility] options.   ? Examination-Activity Limitations Bed Mobility;Bend;Caring for Others;Carry;Locomotion Level;Stairs;Stand;Squat;Sit;Lift;Dressing   ? Examination-Participation Restrictions Interpersonal Relationship;Cleaning;Community Activity;Driving   ? Stability/Clinical Decision Making Evolving/Moderate complexity   ? Rehab Potential Good   ? PT Frequency Other (comment)   ? PT Duration 8 weeks   ? PT Treatment/Interventions Aquatic Therapy;ADLs/Self Care Home Management;Cryotherapy;Electrical Stimulation;Moist Heat;Ultrasound;Gait training;Stair training;Functional mobility training;Therapeutic activities;Therapeutic exercise;Balance training;Neuromuscular re-education;Patient/family education;Manual techniques;Passive range of motion;Dry needling;Taping;Spinal Manipulations;Joint Manipulations   ? PT Next Visit Plan DC from aquatics   ? PT Home Exercise Plan Access Code: [email or web address];  Access Code: [email or web address] (09/05/21) Added aquatics 10/12/21 printed, laminated.   ? ?  ?  ? ?  ? ? ?Patient will benefit from skilled therapeutic intervention in order to improve the  following deficits and impairments:  Decreased endurance, Decreased mobility, Difficulty walking, Increased muscle spasms, Impaired sensation, Decreased range of motion, Impaired perceived functional ability, D

## 2021-10-26 NOTE — Patient Instructions (Signed)
Access Code: [email or web address] ?URL: [email or web address] ?Date: 10/26/2021 ?Prepared by: Glenetta Hew ? ?Exercises ?- Prone Press Up On Elbows  - 1 x daily - 7 x weekly - 3 sets - 10 reps ?- Prone Knee Flexion  - 1 x daily - 7 x weekly - 3 sets - 10 reps ?- Clamshell  - 1 x daily - 7 x weekly - 2-3 sets - 10 reps ?- Sidelying Hip Abduction  - 1 x daily - 7 x weekly - 2-3 sets - 10 reps ?- Seated Pelvic Tilt  - 1 x daily - 7 x weekly - 1 sets - 10 reps - 5-10 sec  hold ?- Seated March  - 1 x daily - 7 x weekly - 3 sets - 10 reps ?- Heel Raises with Counter Support  - 1 x daily - 7 x weekly - 3 sets - 10 reps ?- Standing Hip Extension with Counter Support  - 1 x daily - 7 x weekly - 3 sets - 10 reps ?- Standing Hip Abduction with Counter Support  - 1 x daily - 7 x weekly - 3 sets - 10 reps ?- Side to Side Pendulum Swing with Foam Dumbbells and Ankle Floats  - 1 x daily - 7 x weekly - 3 sets - 10 reps ?- Forward Backward Pendulum Swings with Hip Abduction and Adduction  - 1 x daily - 7 x weekly - 3 sets - 10 reps ?- Abdominal Curls Center with Upper Extremity Flotation  - 1 x daily - 7 x weekly - 3 sets - 10 reps ?- Plank on [employer]  - 1 x daily - 7 x weekly - 3 sets - 10 reps ?Rudean Haskell on [employer] with Leg Lift  - 1 x daily - 7 x weekly - 3 sets - 10 reps ?Rudean Haskell on [employer] with Arm Lifts  - 1 x daily - 7 x weekly - 3 sets - 10 reps ?

## 2021-10-27 DIAGNOSIS — G4733 Obstructive sleep apnea (adult) (pediatric): Secondary | ICD-10-CM | POA: Diagnosis not present

## 2021-10-30 ENCOUNTER — Encounter: Payer: Self-pay | Admitting: Family Medicine

## 2021-10-30 NOTE — Telephone Encounter (Signed)
Materials to collect given to them at visit in March.  Is she having new symptoms?

## 2021-11-14 ENCOUNTER — Telehealth: Payer: Self-pay | Admitting: Family Medicine

## 2021-11-14 NOTE — Telephone Encounter (Signed)
Called patient & left vm at 1:46 that appt on 5/24 will need to be reschedule Provider not in office during this time. Okay to use same day slot per Oletta Darter Per Dr. Zigmund Daniel when patient returns call.gh

## 2021-11-15 ENCOUNTER — Ambulatory Visit: Payer: Medicaid Other | Admitting: Family Medicine

## 2021-11-17 ENCOUNTER — Encounter: Payer: Self-pay | Admitting: Family Medicine

## 2021-11-17 ENCOUNTER — Ambulatory Visit: Payer: Medicaid Other | Admitting: Family Medicine

## 2021-11-17 VITALS — BP 126/75 | HR 70 | Ht 69.0 in | Wt 358.0 lb

## 2021-11-17 DIAGNOSIS — R7989 Other specified abnormal findings of blood chemistry: Secondary | ICD-10-CM

## 2021-11-17 DIAGNOSIS — M5416 Radiculopathy, lumbar region: Secondary | ICD-10-CM

## 2021-11-18 LAB — COMPLETE METABOLIC PANEL WITH GFR
AG Ratio: 1.5 (calc) (ref 1.0–2.5)
ALT: 37 U/L — ABNORMAL HIGH (ref 6–29)
AST: 23 U/L (ref 10–30)
Albumin: 4.2 g/dL (ref 3.6–5.1)
Alkaline phosphatase (APISO): 68 U/L (ref 31–125)
BUN: 17 mg/dL (ref 7–25)
CO2: 28 mmol/L (ref 20–32)
Calcium: 9.6 mg/dL (ref 8.6–10.2)
Chloride: 105 mmol/L (ref 98–110)
Creat: 0.58 mg/dL (ref 0.50–0.99)
Globulin: 2.8 g/dL (calc) (ref 1.9–3.7)
Glucose, Bld: 102 mg/dL (ref 65–139)
Potassium: 4.3 mmol/L (ref 3.5–5.3)
Sodium: 142 mmol/L (ref 135–146)
Total Bilirubin: 0.3 mg/dL (ref 0.2–1.2)
Total Protein: 7 g/dL (ref 6.1–8.1)
eGFR: 117 mL/min/{1.73_m2} (ref >=60)

## 2021-11-20 DIAGNOSIS — M5416 Radiculopathy, lumbar region: Secondary | ICD-10-CM | POA: Insufficient documentation

## 2021-11-20 DIAGNOSIS — R7989 Other specified abnormal findings of blood chemistry: Secondary | ICD-10-CM | POA: Insufficient documentation

## 2021-11-20 NOTE — Progress Notes (Signed)
Ariana Parks - 41 y.o. female MRN [id number]  Date of birth: [birth year]  Subjective Chief Complaint  Patient presents with   Back Pain    HPI Ariana Parks is a 41 year old female here today for follow-up of low back pain.  She was unable to complete physical therapy due to continued pain.  Most of her therapy was aquatic therapy as she was unable to tolerate routine physical therapy.  She does have trouble completing tasks around her house due to her back pain.  She does report that her legs go numb at times.  This is bilateral in nature.  She denies any weakness in her legs.  No bowel or bladder incontinence.  She has gained a significant amount of weight over the past year.  ROS:  A comprehensive ROS was completed and negative except as noted per HPI  Allergies  Allergen Reactions   Citalopram     Other reaction(s): Bradycardia   Celexa [Citalopram Hydrobromide] Other (See Comments)    Hospitalized due to low heart rate   Ibuprofen Other (See Comments)    High doses (petechial bruising)   Prednisone Other (See Comments)    Shaking uncontrollably    Prilosec [Omeprazole] Other (See Comments)    Hospitalized due to low heart rate   Wellbutrin [Bupropion] Other (See Comments)    Lows seizure threshold    Flexeril [Cyclobenzaprine] Other (See Comments)    Bizarre dreams/exacerbate depression    Past Medical History:  Diagnosis Date   Abnormal Pap smear of cervix 2005   Mod dysplasia   Depression    Endometriosis    GERD (gastroesophageal reflux disease)    Gestational diabetes mellitus (GDM) affecting first pregnancy    High cholesterol    Nonocclusive mesenteric ischemia ([place]) 04/04/2018   Dx colonscopy Aug 2019 GAP   OSA (obstructive sleep apnea) 07/05/2016   Panic attack    PCOS (polycystic ovarian syndrome)     Past Surgical History:  Procedure Laterality Date   CESAREAN SECTION N/A 07/24/2020   Procedure: PRIMARY CESAREAN SECTION;  Surgeon: Everlene Farrier,  MD;  Location: [facility];  Service: Obstetrics;  Laterality: N/A;   KNEE SURGERY     LEEP     TONSILLECTOMY     WISDOM TOOTH EXTRACTION      Social History   Socioeconomic History   Marital status: Single    Spouse name: Not on file   Number of children: Not on file   Years of education: Not on file   Highest education level: Not on file  Occupational History   Occupation: Designer, jewellery  Tobacco Use   Smoking status: Former    Packs/day: 0.50    Years: 15.00    Pack years: 7.50    Types: Cigarettes    Quit date: 05/25/2020    Years since quitting: 1.4   Smokeless tobacco: Never   Tobacco comments:    using vapes  Vaping Use   Vaping Use: Never used  Substance and Sexual Activity   Alcohol use: Not Currently    Alcohol/week: 0.0 standard drinks   Drug use: No   Sexual activity: Yes    Partners: Male    Birth control/protection: None  Other Topics Concern   Not on file  Social History Narrative   Not on file   Social Determinants of Health   Financial Resource Strain: Not on file  Food Insecurity: Not on file  Transportation Needs: Not on file  Physical Activity: Not on file  Stress: Not on file  Social Connections: Not on file    Family History  Problem Relation Age of Onset   Hypertension Mother    Thyroid disease Mother    GER disease Father    Migraines Father    Heart attack Other    Thyroid disease Brother     Health Maintenance  Topic Date Due   PAP SMEAR-Modifier  02/23/2022 (Originally 08/09/2007)   COVID-19 Vaccine (2 - Pfizer series) 02/23/2022 (Originally 06/14/2020)   INFLUENZA VACCINE  01/23/2022   COLONOSCOPY (Pts 45-35yr Insurance coverage will need to be confirmed)  02/07/2023   TETANUS/TDAP  05/03/2030   Hepatitis C Screening  Completed   HIV Screening  Completed   HPV VACCINES  Aged Out      ----------------------------------------------------------------------------------------------------------------------------------------------------------------------------------------------------------------- Physical Exam BP 126/75 (BP Location: Left Arm, Patient Position: Sitting, Cuff Size: Large)   Pulse 70   Ht '5\' 9"'$  (1.753 m)   Wt (!) 358 lb (162.4 kg)   SpO2 97%   BMI 52.87 kg/m   Physical Exam Constitutional:      Appearance: Normal appearance. She is obese.  Eyes:     General: No scleral icterus. Neurological:     General: No focal deficit present.     Mental Status: She is alert.  Psychiatric:        Mood and Affect: Mood normal.        Behavior: Behavior normal.    ------------------------------------------------------------------------------------------------------------------------------------------------------------------------------------------------------------------- Assessment and Plan  Chronic lumbar radiculopathy She continue to have low back pain.  Pain is worsened and more radicular in nature.  She was unable to complete PT.  MRI of lumbar spine ordered for further evaluation and interventional planning.    Elevated LFTs Likely due to fatty liver disease.  Updated labs ordered.     No orders of the defined types were placed in this encounter.   No follow-ups on file.    This visit occurred during the SARS-CoV-2 public health emergency.  Safety protocols were in place, including screening questions prior to the visit, additional usage of staff PPE, and extensive cleaning of exam room while observing appropriate contact time as indicated for disinfecting solutions.

## 2021-11-20 NOTE — Assessment & Plan Note (Signed)
She continue to have low back pain.  Pain is worsened and more radicular in nature.  She was unable to complete PT.  MRI of lumbar spine ordered for further evaluation and interventional planning.

## 2021-11-20 NOTE — Assessment & Plan Note (Signed)
Likely due to fatty liver disease.  Updated labs ordered.

## 2021-11-27 DIAGNOSIS — N911 Secondary amenorrhea: Secondary | ICD-10-CM | POA: Diagnosis not present

## 2021-11-27 DIAGNOSIS — G4733 Obstructive sleep apnea (adult) (pediatric): Secondary | ICD-10-CM | POA: Diagnosis not present

## 2021-11-29 ENCOUNTER — Other Ambulatory Visit: Payer: Self-pay | Admitting: Family Medicine

## 2021-11-29 MED ORDER — LIDOCAINE 5 % EX PTCH: 1.0000 | MEDICATED_PATCH | CUTANEOUS | 0 refills | Status: DC

## 2021-12-05 ENCOUNTER — Telehealth: Payer: Self-pay

## 2021-12-05 NOTE — Telephone Encounter (Signed)
Initiated Prior authorization forLidocaine 5% patches: Via: Covermymeds Case/Key: [id number] Status: approved  as of 12/05/21 Reason:Coverage Starts on: 12/05/2021 12:00:00 AM, Coverage Ends on: 12/05/2022 12:00:00 AM Notified Pt via: Mychart

## 2021-12-06 ENCOUNTER — Other Ambulatory Visit: Payer: Self-pay | Admitting: Family Medicine

## 2021-12-17 ENCOUNTER — Ambulatory Visit (INDEPENDENT_AMBULATORY_CARE_PROVIDER_SITE_OTHER): Payer: Medicaid Other

## 2021-12-17 DIAGNOSIS — M5416 Radiculopathy, lumbar region: Secondary | ICD-10-CM

## 2021-12-27 DIAGNOSIS — Z9989 Dependence on other enabling machines and devices: Secondary | ICD-10-CM | POA: Diagnosis not present

## 2021-12-27 DIAGNOSIS — G4733 Obstructive sleep apnea (adult) (pediatric): Secondary | ICD-10-CM | POA: Diagnosis not present

## 2021-12-27 DIAGNOSIS — Z6841 Body Mass Index (BMI) 40.0 and over, adult: Secondary | ICD-10-CM | POA: Diagnosis not present

## 2021-12-27 DIAGNOSIS — Z789 Other specified health status: Secondary | ICD-10-CM | POA: Diagnosis not present

## 2022-01-20 ENCOUNTER — Other Ambulatory Visit: Payer: Self-pay | Admitting: Family Medicine

## 2022-01-27 DIAGNOSIS — G4733 Obstructive sleep apnea (adult) (pediatric): Secondary | ICD-10-CM | POA: Diagnosis not present

## 2022-02-20 ENCOUNTER — Ambulatory Visit (INDEPENDENT_AMBULATORY_CARE_PROVIDER_SITE_OTHER): Payer: Medicaid Other | Admitting: Family Medicine

## 2022-02-20 ENCOUNTER — Encounter: Payer: Self-pay | Admitting: Family Medicine

## 2022-02-20 DIAGNOSIS — M5416 Radiculopathy, lumbar region: Secondary | ICD-10-CM | POA: Diagnosis not present

## 2022-02-20 DIAGNOSIS — R61 Generalized hyperhidrosis: Secondary | ICD-10-CM | POA: Diagnosis not present

## 2022-02-20 DIAGNOSIS — F419 Anxiety disorder, unspecified: Secondary | ICD-10-CM

## 2022-02-20 MED ORDER — TENS WIRED PAIN MANAGEMENT DEVI: 0 refills | Status: DC

## 2022-02-20 MED ORDER — FLUOXETINE HCL 20 MG PO CAPS: 20.0000 mg | ORAL_CAPSULE | Freq: Every day | ORAL | 0 refills | Status: DC

## 2022-02-20 NOTE — Assessment & Plan Note (Signed)
Encouraged to remain active.  Adding portable TENS unit.

## 2022-02-20 NOTE — Assessment & Plan Note (Signed)
She is concerned this may be related to medication.  Her weight gain has likely contributed to this as well.  Encouraged to remain more active and work on dietary change for weight loss.

## 2022-02-20 NOTE — Progress Notes (Signed)
Ariana Parks - 41 y.o. female MRN [id number]  Date of birth: 09-Dec-1980  Subjective Chief Complaint  Patient presents with   Excessive Sweating    HPI Ariana Parks is a 41 year old female here today with complaint of excessive sweating.  Reports that she feels like she sweats nearly all the time.  Thinks this may be related to her sertraline.  She thinks she has had this since her daughter was born about 19 months ago.  She has started to wean off her sertraline.  When depression symptoms from this.  He is still breast-feeding some.  She does recall taking Paxil in high school and thinks that she may have done well with this.  She has gained a significant amount of weight over the past several months.  She continues to deal with daily back pain.  She was unable to tolerate physical therapy.  X-rays of mild degenerative changes around L1-L2.  MRI did not show any significant disc herniation or stenosis.  She is using ibuprofen as needed.  She is interested in adding TENS unit.  ROS:  A comprehensive ROS was completed and negative except as noted per HPI  Allergies  Allergen Reactions   Citalopram     Other reaction(s): Bradycardia   Celexa [Citalopram Hydrobromide] Other (See Comments)    Hospitalized due to low heart rate   Ibuprofen Other (See Comments)    High doses (petechial bruising)   Prednisone Other (See Comments)    Shaking uncontrollably    Prilosec [Omeprazole] Other (See Comments)    Hospitalized due to low heart rate   Wellbutrin [Bupropion] Other (See Comments)    Lows seizure threshold    Flexeril [Cyclobenzaprine] Other (See Comments)    Bizarre dreams/exacerbate depression    Past Medical History:  Diagnosis Date   Abnormal Pap smear of cervix 2005   Mod dysplasia   Depression    Endometriosis    GERD (gastroesophageal reflux disease)    Gestational diabetes mellitus (GDM) affecting first pregnancy    High cholesterol    Nonocclusive mesenteric ischemia ([place])  04/04/2018   Dx colonscopy Aug 2019 GAP   OSA (obstructive sleep apnea) 07/05/2016   Panic attack    PCOS (polycystic ovarian syndrome)     Past Surgical History:  Procedure Laterality Date   CESAREAN SECTION N/A 07/24/2020   Procedure: PRIMARY CESAREAN SECTION;  Surgeon: Everlene Farrier, MD;  Location: [facility];  Service: Obstetrics;  Laterality: N/A;   KNEE SURGERY     LEEP     TONSILLECTOMY     WISDOM TOOTH EXTRACTION      Social History   Socioeconomic History   Marital status: Single    Spouse name: Not on file   Number of children: Not on file   Years of education: Not on file   Highest education level: Not on file  Occupational History   Occupation: Designer, jewellery  Tobacco Use   Smoking status: Former    Packs/day: 0.50    Years: 15.00    Total pack years: 7.50    Types: Cigarettes    Quit date: 05/25/2020    Years since quitting: 1.7   Smokeless tobacco: Never   Tobacco comments:    using vapes  Vaping Use   Vaping Use: Never used  Substance and Sexual Activity   Alcohol use: Not Currently    Alcohol/week: 0.0 standard drinks of alcohol   Drug use: No   Sexual activity: Yes    Partners: Male  Birth control/protection: None  Other Topics Concern   Not on file  Social History Narrative   Not on file   Social Determinants of Health   Financial Resource Strain: Not on file  Food Insecurity: No Food Insecurity (05/23/2020)   Hunger Vital Sign    Worried About Running Out of Food in the Last Year: Never true    Ran Out of Food in the Last Year: Never true  Transportation Needs: Not on file  Physical Activity: Not on file  Stress: Not on file  Social Connections: Not on file    Family History  Problem Relation Age of Onset   Hypertension Mother    Thyroid disease Mother    GER disease Father    Migraines Father    Heart attack Other    Thyroid disease Brother     Health Maintenance  Topic Date Due   PAP SMEAR-Modifier  02/23/2022  (Originally 08/09/2007)   COVID-19 Vaccine (2 - Pfizer series) 02/23/2022 (Originally 07/19/2020)   INFLUENZA VACCINE  09/23/2022 (Originally 01/23/2022)   COLONOSCOPY (Pts 45-22yr Insurance coverage will need to be confirmed)  02/07/2023   TETANUS/TDAP  05/03/2030   Hepatitis C Screening  Completed   HIV Screening  Completed   HPV VACCINES  Aged Out     ----------------------------------------------------------------------------------------------------------------------------------------------------------------------------------------------------------------- Physical Exam BP 102/69 (BP Location: Left Arm, Patient Position: Sitting, Cuff Size: Large)   Pulse 89   Ht '5\' 9"'$  (1.753 m)   Wt (!) 354 lb (160.6 kg)   SpO2 95%   BMI 52.28 kg/m   Physical Exam Constitutional:      Appearance: Normal appearance.  Neurological:     Mental Status: She is alert.  Psychiatric:        Mood and Affect: Mood normal.        Behavior: Behavior normal.     ------------------------------------------------------------------------------------------------------------------------------------------------------------------------------------------------------------------- Assessment and Plan  Anxiety Interested in coming off of sertraline.  We will continue to wean from this and add fluoxetine.  She is still breast-feeding however her daughter is 194 monthsold at this time.  We discussed monitoring for any changes in her temperament.  Return in about 6 months (around 08/23/2022) for F/u Mood/Sweating.   Hyperhidrosis She is concerned this may be related to medication.  Her weight gain has likely contributed to this as well.  Encouraged to remain more active and work on dietary change for weight loss.  Chronic lumbar radiculopathy Encouraged to remain active.  Adding portable TENS unit.   Meds ordered this encounter  Medications   Nerve Stimulator (TENS WIRED PAIN MANAGEMENT) DEVI    Sig: Use 1-2  times daily as needed for chronic back pain.  Please provide pads and batteries.    Dispense:  1 each    Refill:  0   FLUoxetine (PROZAC) 20 MG capsule    Sig: Take 1 capsule (20 mg total) by mouth daily.    Dispense:  90 capsule    Refill:  0    Return in about 6 months (around 08/23/2022) for F/u Mood/Sweating.    This visit occurred during the SARS-CoV-2 public health emergency.  Safety protocols were in place, including screening questions prior to the visit, additional usage of staff PPE, and extensive cleaning of exam room while observing appropriate contact time as indicated for disinfecting solutions.

## 2022-02-20 NOTE — Assessment & Plan Note (Signed)
Interested in coming off of sertraline.  We will continue to wean from this and add fluoxetine.  She is still breast-feeding however her daughter is 77 months old at this time.  We discussed monitoring for any changes in her temperament.  Return in about 6 months (around 08/23/2022) for F/u Mood/Sweating.

## 2022-02-20 NOTE — Patient Instructions (Addendum)
Starting on Monday reduce sertraline to 1/2 tab x5 days.  Then 1/2 tab every other day x10 days.   On the days you are not taking sertraline start fluoxetine.  See me again in about 6 weeks

## 2022-02-23 DIAGNOSIS — H5213 Myopia, bilateral: Secondary | ICD-10-CM | POA: Diagnosis not present

## 2022-02-24 ENCOUNTER — Other Ambulatory Visit: Payer: Self-pay | Admitting: Family Medicine

## 2022-02-27 DIAGNOSIS — G4733 Obstructive sleep apnea (adult) (pediatric): Secondary | ICD-10-CM | POA: Diagnosis not present

## 2022-02-27 NOTE — Telephone Encounter (Signed)
Faxed Tens Unit Rx to [facility] @ [phone].  Units also available on [place] for $30.

## 2022-03-12 ENCOUNTER — Encounter: Payer: Self-pay | Admitting: Family Medicine

## 2022-03-12 MED ORDER — TENS WIRED PAIN MANAGEMENT DEVI: 0 refills | Status: AC

## 2022-03-12 NOTE — Telephone Encounter (Signed)
Order was printed and given to her at last visit.  I have re-ordered and placed in Panya's box.

## 2022-03-20 DIAGNOSIS — N61 Mastitis without abscess: Secondary | ICD-10-CM | POA: Diagnosis not present

## 2022-03-29 DIAGNOSIS — G4733 Obstructive sleep apnea (adult) (pediatric): Secondary | ICD-10-CM | POA: Diagnosis not present

## 2022-04-07 ENCOUNTER — Other Ambulatory Visit: Payer: Self-pay | Admitting: Family Medicine

## 2022-04-16 DIAGNOSIS — M25561 Pain in right knee: Secondary | ICD-10-CM | POA: Diagnosis not present

## 2022-04-16 DIAGNOSIS — M170 Bilateral primary osteoarthritis of knee: Secondary | ICD-10-CM | POA: Diagnosis not present

## 2022-05-01 DIAGNOSIS — H52221 Regular astigmatism, right eye: Secondary | ICD-10-CM | POA: Diagnosis not present

## 2022-05-01 DIAGNOSIS — H5213 Myopia, bilateral: Secondary | ICD-10-CM | POA: Diagnosis not present

## 2022-05-02 DIAGNOSIS — M25561 Pain in right knee: Secondary | ICD-10-CM | POA: Diagnosis not present

## 2022-05-07 ENCOUNTER — Other Ambulatory Visit: Payer: Self-pay | Admitting: Family Medicine

## 2022-06-01 DIAGNOSIS — S83281A Other tear of lateral meniscus, current injury, right knee, initial encounter: Secondary | ICD-10-CM | POA: Diagnosis not present

## 2022-06-06 ENCOUNTER — Ambulatory Visit
Admission: EM | Admit: 2022-06-06 | Discharge: 2022-06-06 | Disposition: A | Discharge status: Home / Self Care | Payer: Medicaid Other | Attending: Family Medicine | Admitting: Family Medicine

## 2022-06-06 ENCOUNTER — Encounter: Payer: Self-pay | Admitting: Emergency Medicine

## 2022-06-06 DIAGNOSIS — J029 Acute pharyngitis, unspecified: Secondary | ICD-10-CM | POA: Diagnosis not present

## 2022-06-06 LAB — POCT RAPID STREP A (OFFICE): Rapid Strep A Screen: NEGATIVE

## 2022-06-06 LAB — POC SARS CORONAVIRUS 2 AG -  ED: SARS Coronavirus 2 Ag: NEGATIVE

## 2022-06-06 MED ORDER — AZITHROMYCIN 250 MG PO TABS
ORAL_TABLET | ORAL | 0 refills | Status: DC
Start: 2022-06-06 — End: 2022-06-12

## 2022-06-06 NOTE — ED Provider Notes (Signed)
[facility]    CSN: [id number] Arrival date & time: 06/06/22  2297      History   Chief Complaint Chief Complaint  Patient presents with   Sore Throat    HPI Ariana Parks is a 41 y.o. female.   HPI  Patient has had a sore throat since yesterday.  Mild nasal drainage.  Her throat is very painful.  She has had a tonsillectomy.  She has a 33-year-old at home.  43-year-old is on medicine for ear infection.  States she has also been around her mother who was on antibiotics for a sinus infection.  Past Medical History:  Diagnosis Date   Abnormal Pap smear of cervix 2005   Mod dysplasia   Depression    Endometriosis    GERD (gastroesophageal reflux disease)    Gestational diabetes mellitus (GDM) affecting first pregnancy    High cholesterol    Nonocclusive mesenteric ischemia ([place]) 04/04/2018   Dx colonscopy Aug 2019 GAP   OSA (obstructive sleep apnea) 07/05/2016   Panic attack    PCOS (polycystic ovarian syndrome)     Patient Active Problem List   Diagnosis Date Noted   Hyperhidrosis 02/20/2022   Chronic lumbar radiculopathy 11/20/2021   Elevated LFTs 11/20/2021   Abnormal weight gain 05/02/2021   Chronic bilateral low back pain without sciatica 05/02/2021   Lumbar paraspinal muscle spasm 12/08/2020   COVID-19 06/16/2020   Gestational diabetes mellitus (GDM), antepartum 05/23/2020   Pain, dental 04/19/2020   GERD (gastroesophageal reflux disease) 03/24/2020   Nonocclusive mesenteric ischemia ([place]) 04/04/2018   DUB (dysfunctional uterine bleeding) 03/14/2017   Prediabetes 02/14/2017   OSA (obstructive sleep apnea) 07/05/2016   Morbid obesity ([place]) 07/05/2016   PCOS (polycystic ovarian syndrome) 05/31/2015   Abnormal EEG 01/02/2012   ADD (attention deficit disorder) 01/02/2012   Anxiety 01/02/2012   MDD (major depressive disorder) 01/02/2012   Current tobacco use 01/02/2012    Past Surgical History:  Procedure Laterality Date   CESAREAN SECTION  N/A 07/24/2020   Procedure: PRIMARY CESAREAN SECTION;  Surgeon: Everlene Farrier, MD;  Location: [facility] LD ORS;  Service: Obstetrics;  Laterality: N/A;   KNEE SURGERY     LEEP     TONSILLECTOMY     WISDOM TOOTH EXTRACTION      OB History     Gravida  1   Para  1   Term  1   Preterm  0   AB  0   Living  1      SAB  0   IAB  0   Ectopic  0   Multiple  0   Live Births  1            Home Medications    Prior to Admission medications   Medication Sig Start Date End Date Taking? Authorizing Provider  acetaminophen (TYLENOL) 325 MG tablet Take 2 tablets (650 mg total) by mouth every 6 (six) hours as needed. 07/27/20  Yes Carlyon Shadow, MD  azithromycin (ZITHROMAX Z-PAK) 250 MG tablet Take two pills today followed by one a day until gone 06/06/22  Yes Raylene Everts, MD  FLUoxetine (PROZAC) 20 MG capsule Take 1 capsule (20 mg total) by mouth daily. 02/20/22  Yes Matthews, Cody, DO  lidocaine (LIDODERM) 5 % PLACE 1 PATCH ONTO THE SKIN DAILY. REMOVE & DISCARD PATCH WITHIN 12 HOURS OR AS DIRECTED BY MD 05/07/22  Yes Luetta Nutting, DO  Nerve Stimulator (TENS WIRED PAIN MANAGEMENT) DEVI Use  1-2 times daily as needed for chronic back pain.  Please provide pads and batteries. 03/12/22  Yes Luetta Nutting, DO    Family History Family History  Problem Relation Age of Onset   Hypertension Mother    Thyroid disease Mother    GER disease Father    Migraines Father    Heart attack Other    Thyroid disease Brother     Social History Social History   Tobacco Use   Smoking status: Former    Packs/day: 0.50    Years: 15.00    Total pack years: 7.50    Types: Cigarettes    Quit date: 05/25/2020    Years since quitting: 2.0   Smokeless tobacco: Never   Tobacco comments:    using vapes  Vaping Use   Vaping Use: Never used  Substance Use Topics   Alcohol use: Not Currently    Alcohol/week: 0.0 standard drinks of alcohol   Drug use: No     Allergies   Citalopram,  Celexa [citalopram hydrobromide], Ibuprofen, Prednisone, Prilosec [omeprazole], Wellbutrin [bupropion], and Flexeril [cyclobenzaprine]   Review of Systems Review of Systems  See HPI Physical Exam Triage Vital Signs ED Triage Vitals  Enc Vitals Group     BP 06/06/22 0850 132/82     Pulse Rate 06/06/22 0850 76     Resp 06/06/22 0850 18     Temp 06/06/22 0850 97.9 F (36.6 C)     Temp Source 06/06/22 0850 Oral     SpO2 06/06/22 0850 94 %     Weight 06/06/22 0851 (!) 355 lb (161 kg)     Height 06/06/22 0851 '5\' 9"'$  (1.753 m)     Head Circumference --      Peak Flow --      Pain Score 06/06/22 0851 6     Pain Loc --      Pain Edu? --      Excl. in [place]? --    No data found.  Updated Vital Signs BP 132/82 (BP Location: Right Arm)   Pulse 76   Temp 97.9 F (36.6 C) (Oral)   Resp 18   Ht '5\' 9"'$  (1.753 m)   Wt (!) 161 kg   SpO2 94%   Breastfeeding Yes   BMI 52.42 kg/m       Physical Exam Constitutional:      General: She is not in acute distress.    Appearance: She is well-developed. She is obese. She is ill-appearing.  HENT:     Head: Normocephalic and atraumatic.     Mouth/Throat:     Mouth: Mucous membranes are moist.     Pharynx: Uvula midline. Pharyngeal swelling and posterior oropharyngeal erythema present.     Tonsils: No tonsillar exudate or tonsillar abscesses. 0 on the right. 0 on the left.     Comments: From the soft palate down the tonsillar pillars there is mild soft tissue swelling and deep erythema with no exudate Eyes:     Conjunctiva/sclera: Conjunctivae normal.     Pupils: Pupils are equal, round, and reactive to light.  Cardiovascular:     Rate and Rhythm: Normal rate.  Pulmonary:     Effort: Pulmonary effort is normal. No respiratory distress.  Abdominal:     General: There is no distension.     Palpations: Abdomen is soft.  Musculoskeletal:        General: Normal range of motion.     Cervical back: Normal range of motion.  Lymphadenopathy:      Cervical: Cervical adenopathy present.  Skin:    General: Skin is warm and dry.  Neurological:     Mental Status: She is alert.      UC Treatments / Results  Labs (all labs ordered are listed, but only abnormal results are displayed) Labs Reviewed  POC SARS CORONAVIRUS 2 AG -  ED - Normal  POCT RAPID STREP A (OFFICE)    EKG   Radiology No results found.  Procedures Procedures (including critical care time)  Medications Ordered in UC Medications - No data to display  Initial Impression / Assessment and Plan / UC Course  I have reviewed the triage vital signs and the nursing notes.  Pertinent labs & imaging results that were available during my care of the patient were reviewed by me and considered in my medical decision making (see chart for details).     Rapid strep is negative.  COVID test is negative.  Concern for asymmetry of throat infection left greater than right with left adenopathy greater than right.  Uvula is swollen and erythematous.  Will treat with antibiotics pending culture report Final Clinical Impressions(s) / UC Diagnoses   Final diagnoses:  Acute pharyngitis, unspecified etiology     Discharge Instructions      Take Z-Pak as directed Drink lots of fluids Chloraseptic spray or salt water gargles will help with throat pain May take Tylenol or ibuprofen for throat pain   ED Prescriptions     Medication Sig Dispense Auth. Provider   azithromycin (ZITHROMAX Z-PAK) 250 MG tablet Take two pills today followed by one a day until gone 6 tablet Meda Coffee Jennette Banker, MD      PDMP not reviewed this encounter.   Raylene Everts, MD 06/06/22 1022

## 2022-06-06 NOTE — Discharge Instructions (Signed)
Take Z-Pak as directed Drink lots of fluids Chloraseptic spray or salt water gargles will help with throat pain May take Tylenol or ibuprofen for throat pain

## 2022-06-06 NOTE — ED Triage Notes (Signed)
Patient c/o sore throat x 1 day, some nasal drainage.  Patient denies any OTC pain meds.

## 2022-06-08 NOTE — Patient Instructions (Addendum)
SURGICAL WAITING ROOM VISITATION Patients having surgery or a procedure may have no more than 2 support people in the waiting area - these visitors may rotate.   Children under the age of 16 must have an adult with them who is not the patient. If the patient needs to stay at the hospital during part of their recovery, the visitor guidelines for inpatient rooms apply. Pre-op nurse will coordinate an appropriate time for 1 support person to accompany patient in pre-op.  This support person may not rotate.    Please refer to the [facility] website for the visitor guidelines for Inpatients (after your surgery is over and you are in a regular room).      Your procedure is scheduled on: 06-20-22   Report to [facility] Main Entrance    Report to admitting at 7:45 AM   Call this number if you have problems the morning of surgery [phone]   Do not eat food :After Midnight.   After Midnight you may have the following liquids until 7:00 AM DAY OF SURGERY  Water Non-Citrus Juices (without pulp, NO RED) Carbonated Beverages Black Coffee (NO MILK/CREAM OR CREAMERS, sugar ok)  Clear Tea (NO MILK/CREAM OR CREAMERS, sugar ok) regular and decaf                             Plain Jell-O (NO RED)                                           Fruit ices (not with fruit pulp, NO RED)                                     Popsicles (NO RED)                                                               Sports drinks like Gatorade (NO RED)                   The day of surgery:  Drink ONE (1) Pre-Surgery G2 at 7:00 AM the morning of surgery. Drink in one sitting. Do not sip.  This drink was given to you during your hospital  pre-op appointment visit. Nothing else to drink after completing the Pre-Surgery G2.          If you have questions, please contact your surgeon's office.   FOLLOW ANY ADDITIONAL PRE OP INSTRUCTIONS YOU RECEIVED FROM YOUR SURGEON'S OFFICE!!!     Oral Hygiene is also  important to reduce your risk of infection.                                    Remember - BRUSH YOUR TEETH THE MORNING OF SURGERY WITH YOUR REGULAR TOOTHPASTE   Do NOT smoke after Midnight   Take these medicines the morning of surgery with A SIP OF WATER:   Fluoxetine  Tylenol if needed  DO NOT TAKE ANY ORAL DIABETIC MEDICATIONS  DAY OF YOUR SURGERY  Bring CPAP mask and tubing day of surgery.                              You may not have any metal on your body including hair pins, jewelry, and body piercing             Do not wear make-up, lotions, powders, perfumes or deodorant  Do not wear nail polish including gel and S&S, artificial/acrylic nails, or any other type of covering on natural nails including finger and toenails. If you have artificial nails, gel coating, etc. that needs to be removed by a nail salon please have this removed prior to surgery or surgery may need to be canceled/ delayed if the surgeon/ anesthesia feels like they are unable to be safely monitored.   Do not shave  48 hours prior to surgery.    Do not bring valuables to the hospital. [place].   Contacts, dentures or bridgework may not be worn into surgery.  DO NOT [place]. PHARMACY WILL DISPENSE MEDICATIONS LISTED ON YOUR MEDICATION LIST TO YOU DURING YOUR ADMISSION [place]!    Patients discharged on the day of surgery will not be allowed to drive home.  Someone NEEDS to stay with you for the first 24 hours after anesthesia.   Special Instructions: Bring a copy of your healthcare power of attorney and living will documents the day of surgery if you haven't scanned them before.              Please read over the following fact sheets you were given: IF [place] [phone]   If you received a COVID test during your pre-op visit  it is requested that you wear a mask when out  in public, stay away from anyone that may not be feeling well and notify your surgeon if you develop symptoms. If you test positive for Covid or have been in contact with anyone that has tested positive in the last 10 days please notify you surgeon.  [place] - Preparing for Surgery Before surgery, you can play an important role.  Because skin is not sterile, your skin needs to be as free of germs as possible.  You can reduce the number of germs on your skin by washing with CHG (chlorahexidine gluconate) soap before surgery.  CHG is an antiseptic cleaner which kills germs and bonds with the skin to continue killing germs even after washing. Please DO NOT use if you have an allergy to CHG or antibacterial soaps.  If your skin becomes reddened/irritated stop using the CHG and inform your nurse when you arrive at Short Stay. Do not shave (including legs and underarms) for at least 48 hours prior to the first CHG shower.  You may shave your face/neck.  Please follow these instructions carefully:  1.  Shower with CHG Soap the night before surgery and the  morning of surgery.  2.  If you choose to wash your hair, wash your hair first as usual with your normal  shampoo.  3.  After you shampoo, rinse your hair and body thoroughly to remove the shampoo.                             4.  Use CHG as you would any other liquid soap.  You can apply chg directly to the skin and wash.  Gently with a scrungie or clean washcloth.  5.  Apply the CHG Soap to your body ONLY FROM THE NECK DOWN.   Do   not use on face/ open                           Wound or open sores. Avoid contact with eyes, ears mouth and   genitals (private parts).                       Wash face,  Genitals (private parts) with your normal soap.             6.  Wash thoroughly, paying special attention to the area where your    surgery  will be performed.  7.  Thoroughly rinse your body with warm water from the neck down.  8.  DO NOT shower/wash  with your normal soap after using and rinsing off the CHG Soap.                9.  Pat yourself dry with a clean towel.            10.  Wear clean pajamas.            11.  Place clean sheets on your bed the night of your first shower and do not  sleep with pets. Day of Surgery : Do not apply any lotions/deodorants the morning of surgery.  Please wear clean clothes to the hospital/surgery center.  FAILURE TO FOLLOW THESE INSTRUCTIONS MAY RESULT IN THE CANCELLATION OF YOUR SURGERY  PATIENT SIGNATURE_________________________________  NURSE SIGNATURE__________________________________  ________________________________________________________________________    Ariana Parks  An incentive spirometer is a tool that can help keep your lungs clear and active. This tool measures how well you are filling your lungs with each breath. Taking long deep breaths may help reverse or decrease the chance of developing breathing (pulmonary) problems (especially infection) following: A long period of time when you are unable to move or be active. BEFORE THE PROCEDURE  If the spirometer includes an indicator to show your best effort, your nurse or respiratory therapist will set it to a desired goal. If possible, sit up straight or lean slightly forward. Try not to slouch. Hold the incentive spirometer in an upright position. INSTRUCTIONS FOR USE  Sit on the edge of your bed if possible, or sit up as far as you can in bed or on a chair. Hold the incentive spirometer in an upright position. Breathe out normally. Place the mouthpiece in your mouth and seal your lips tightly around it. Breathe in slowly and as deeply as possible, raising the piston or the ball toward the top of the column. Hold your breath for 3-5 seconds or for as long as possible. Allow the piston or ball to fall to the bottom of the column. Remove the mouthpiece from your mouth and breathe out normally. Rest for a few seconds and  repeat Steps 1 through 7 at least 10 times every 1-2 hours when you are awake. Take your time and take a few normal breaths between deep breaths. The spirometer may include an indicator to show your best effort. Use the indicator as a goal to work toward during each repetition. After each set of 10 deep breaths, practice coughing to be sure your  lungs are clear. If you have an incision (the cut made at the time of surgery), support your incision when coughing by placing a pillow or rolled up towels firmly against it. Once you are able to get out of bed, walk around indoors and cough well. You may stop using the incentive spirometer when instructed by your caregiver.  RISKS AND COMPLICATIONS Take your time so you do not get dizzy or light-headed. If you are in pain, you may need to take or ask for pain medication before doing incentive spirometry. It is harder to take a deep breath if you are having pain. AFTER USE Rest and breathe slowly and easily. It can be helpful to keep track of a log of your progress. Your caregiver can provide you with a simple table to help with this. If you are using the spirometer at home, follow these instructions: [place] IF:  You are having difficultly using the spirometer. You have trouble using the spirometer as often as instructed. Your pain medication is not giving enough relief while using the spirometer. You develop fever of 100.5 F (38.1 C) or higher. SEEK IMMEDIATE MEDICAL CARE IF:  You cough up bloody sputum that had not been present before. You develop fever of 102 F (38.9 C) or greater. You develop worsening pain at or near the incision site. MAKE SURE YOU:  Understand these instructions. Will watch your condition. Will get help right away if you are not doing well or get worse. Document Released: 10/22/2006 Document Revised: 09/03/2011 Document Reviewed: 12/23/2006 [facility] Patient Information 2014 [place],  [place].   ________________________________________________________________________

## 2022-06-08 NOTE — Progress Notes (Addendum)
COVID Vaccine Completed:  Yes  Date of COVID positive in last 90 days:  PCP - Cpdy Zigmund Daniel, DO Cardiologist -  Neurologist - [facility], PA-C  Chest x-ray -  EKG -  Stress Test -  ECHO -  Cardiac Cath -  Pacemaker/ICD device last checked: Spinal Cord Stimulator:  Bowel Prep -   Sleep Study -  Yes, +sleep apnea CPAP -   Prediabetes Fasting Blood Sugar -  Checks Blood Sugar _____ times a day  Last dose of GLP1 agonist-  N/A GLP1 instructions:  N/A   Last dose of SGLT-2 inhibitors-  N/A SGLT-2 instructions: N/A   Blood Thinner Instructions: Aspirin Instructions: Last Dose:  Activity level:  Can go up a flight of stairs and perform activities of daily living without stopping and without symptoms of chest pain or shortness of breath.  Able to exercise without symptoms  Unable to go up a flight of stairs without symptoms of     Anesthesia review:   Patient denies shortness of breath, fever, cough and chest pain at PAT appointment  Patient verbalized understanding of instructions that were given to them at the PAT appointment. Patient was also instructed that they will need to review over the PAT instructions again at home before surgery.

## 2022-06-11 ENCOUNTER — Telehealth: Payer: Self-pay

## 2022-06-11 NOTE — Telephone Encounter (Signed)
Pt lvm stating she was concerning about her pulse. States she is experiencing dizziness. When laying down pulse drops to 93 (not low) and when upright pulse is 97-99.   Please call patient to schedule appt with Dr. Zigmund Daniel.

## 2022-06-12 ENCOUNTER — Encounter (HOSPITAL_COMMUNITY)
Admission: RE | Admit: 2022-06-12 | Discharge: 2022-06-12 | Disposition: A | Discharge status: Home / Self Care | Payer: Medicaid Other | Source: Ambulatory Visit | Attending: Specialist | Admitting: Specialist

## 2022-06-12 ENCOUNTER — Encounter (HOSPITAL_COMMUNITY): Payer: Self-pay

## 2022-06-12 ENCOUNTER — Other Ambulatory Visit: Payer: Self-pay

## 2022-06-12 VITALS — BP 124/81 | HR 65 | Temp 98.2°F | Resp 16 | Ht 69.0 in | Wt 346.6 lb

## 2022-06-12 DIAGNOSIS — Z01812 Encounter for preprocedural laboratory examination: Secondary | ICD-10-CM | POA: Insufficient documentation

## 2022-06-12 DIAGNOSIS — Z01818 Encounter for other preprocedural examination: Secondary | ICD-10-CM

## 2022-06-12 DIAGNOSIS — R7303 Prediabetes: Secondary | ICD-10-CM | POA: Insufficient documentation

## 2022-06-12 HISTORY — DX: Prediabetes: R73.03

## 2022-06-12 HISTORY — DX: Migraine, unspecified, not intractable, without status migrainosus: G43.909

## 2022-06-12 LAB — COMPREHENSIVE METABOLIC PANEL
ALT: 43 U/L (ref 0–44)
AST: 31 U/L (ref 15–41)
Albumin: 3.8 g/dL (ref 3.5–5.0)
Alkaline Phosphatase: 72 U/L (ref 38–126)
Anion gap: 8 (ref 5–15)
BUN: 17 mg/dL (ref 6–20)
CO2: 26 mmol/L (ref 22–32)
Calcium: 9.1 mg/dL (ref 8.9–10.3)
Chloride: 107 mmol/L (ref 98–111)
Creatinine, Ser: 0.56 mg/dL (ref 0.44–1.00)
GFR, Estimated: >60 mL/min (ref >60)
Glucose, Bld: 162 mg/dL — ABNORMAL HIGH (ref 70–99)
Potassium: 4.1 mmol/L (ref 3.5–5.1)
Sodium: 141 mmol/L (ref 135–145)
Total Bilirubin: 0.4 mg/dL (ref 0.3–1.2)
Total Protein: 7.6 g/dL (ref 6.5–8.1)

## 2022-06-12 LAB — CBC
HCT: 45.9 % (ref 36.0–46.0)
Hemoglobin: 14.6 g/dL (ref 12.0–15.0)
MCH: 30.7 pg (ref 26.0–34.0)
MCHC: 31.8 g/dL (ref 30.0–36.0)
MCV: 96.6 fL (ref 80.0–100.0)
Platelets: 233 10*3/uL (ref 150–400)
RBC: 4.75 MIL/uL (ref 3.87–5.11)
RDW: 13.3 % (ref 11.5–15.5)
WBC: 11 10*3/uL — ABNORMAL HIGH (ref 4.0–10.5)
nRBC: 0 % (ref 0.0–0.2)

## 2022-06-12 LAB — GLUCOSE, CAPILLARY: Glucose-Capillary: 175 mg/dL — ABNORMAL HIGH (ref 70–99)

## 2022-06-12 NOTE — Telephone Encounter (Signed)
Lvm for patient to call back to schedule an appointment for the dizziness and pulse lowering with Dr. Zigmund Daniel. tvt

## 2022-06-13 LAB — HEMOGLOBIN A1C
Hgb A1c MFr Bld: 8.1 % — ABNORMAL HIGH (ref 4.8–5.6)
Mean Plasma Glucose: 186 mg/dL

## 2022-06-15 ENCOUNTER — Ambulatory Visit (INDEPENDENT_AMBULATORY_CARE_PROVIDER_SITE_OTHER): Payer: Medicaid Other | Admitting: Family Medicine

## 2022-06-15 ENCOUNTER — Ambulatory Visit: Payer: Medicaid Other | Admitting: Family Medicine

## 2022-06-15 ENCOUNTER — Encounter: Payer: Self-pay | Admitting: Family Medicine

## 2022-06-15 VITALS — BP 117/77 | HR 79 | Ht 69.0 in | Wt 350.0 lb

## 2022-06-15 DIAGNOSIS — E1165 Type 2 diabetes mellitus with hyperglycemia: Secondary | ICD-10-CM

## 2022-06-15 DIAGNOSIS — J029 Acute pharyngitis, unspecified: Secondary | ICD-10-CM | POA: Diagnosis not present

## 2022-06-15 MED ORDER — FREESTYLE LIBRE 2 READER DEVI: 0 refills | Status: DC

## 2022-06-15 MED ORDER — NYSTATIN 100000 UNIT/ML MT SUSP: 5.0000 mL | Freq: Four times a day (QID) | OROMUCOSAL | 0 refills | Status: AC

## 2022-06-15 MED ORDER — METFORMIN HCL ER 500 MG PO TB24: 500.0000 mg | ORAL_TABLET | Freq: Every day | ORAL | 0 refills | Status: DC

## 2022-06-15 MED ORDER — FREESTYLE LIBRE 2 SENSOR MISC: 1 refills | Status: DC

## 2022-06-15 NOTE — Patient Instructions (Addendum)
Hope your surgery goes well.  Increase fluid intake, use CPAP regularly. Try Nystatin.  Let me know if not improving Lets add metformin.  I have sent over prescription for CGM.  Follow up in 3 months.

## 2022-06-18 DIAGNOSIS — E1165 Type 2 diabetes mellitus with hyperglycemia: Secondary | ICD-10-CM | POA: Insufficient documentation

## 2022-06-18 DIAGNOSIS — J029 Acute pharyngitis, unspecified: Secondary | ICD-10-CM | POA: Insufficient documentation

## 2022-06-18 NOTE — H&P (View-Only) (Signed)
Ariana Parks - 41 y.o. female MRN [id number]  Date of birth: 04/09/1981  Subjective Chief Complaint  Patient presents with   Sore Throat    HPI Ariana Parks is a 41 year old female here today for follow-up visit.   She is having upcoming arthroscopic knee surgery.  During her preop examination she has noted to have elevated A1c in the diabetes range at 8.1%.  She admits that her diet is not very good.  Activity is fairly sedentary.  We have discussed GLP-1's in the past for treatment of her weight loss and her prediabetes.  She is still breast-feeding at this time however rather avoid these for now.  She would like to try continuous glucose monitor to get a better idea of what her blood sugars were going throughout the day.  She is having sore throat.  Normal sensation when swallowing.  She has noticed some white patches in the back of her throat.  Seen in urgent care previously and treated with azithromycin.  Symptoms did not really change or improved since that time.  No fever or chills.  Some reflux symptoms.  Currently using famotidine minimal improvement.  She does not want to use PPIs due to her breast-feeding.  ROS:  A comprehensive ROS was completed and negative except as noted per HPI  Allergies  Allergen Reactions   Citalopram     Other reaction(s): Bradycardia   Celexa [Citalopram Hydrobromide] Other (See Comments)    Hospitalized due to low heart rate   Ibuprofen Other (See Comments)    High doses (petechial bruising)   Prednisone Other (See Comments)    Shaking uncontrollably    Prilosec [Omeprazole] Other (See Comments)    Hospitalized due to low heart rate   Wellbutrin [Bupropion] Other (See Comments)    Lows seizure threshold    Flexeril [Cyclobenzaprine] Other (See Comments)    Bizarre dreams/exacerbate depression    Past Medical History:  Diagnosis Date   Abnormal Pap smear of cervix 2005   Mod dysplasia   Depression    Endometriosis    GERD  (gastroesophageal reflux disease)    Gestational diabetes mellitus (GDM) affecting first pregnancy    High cholesterol    Migraine headache    Nonocclusive mesenteric ischemia ([place]) 04/04/2018   Dx colonscopy Aug 2019 GAP   OSA (obstructive sleep apnea) 07/05/2016   Panic attack    PCOS (polycystic ovarian syndrome)    Pre-diabetes     Past Surgical History:  Procedure Laterality Date   CESAREAN SECTION N/A 07/24/2020   Procedure: PRIMARY CESAREAN SECTION;  Surgeon: Everlene Farrier, MD;  Location: [facility];  Service: Obstetrics;  Laterality: N/A;   KNEE SURGERY     LEEP     TONSILLECTOMY     WISDOM TOOTH EXTRACTION      Social History   Socioeconomic History   Marital status: Single    Spouse name: Not on file   Number of children: Not on file   Years of education: Not on file   Highest education level: Not on file  Occupational History   Occupation: Designer, jewellery  Tobacco Use   Smoking status: Former    Packs/day: 0.50    Years: 15.00    Total pack years: 7.50    Types: Cigarettes    Quit date: 05/25/2020    Years since quitting: 2.0   Smokeless tobacco: Never   Tobacco comments:    using vapes  Vaping Use   Vaping Use: Never used  Substance and Sexual Activity   Alcohol use: Not Currently    Alcohol/week: 0.0 standard drinks of alcohol   Drug use: No   Sexual activity: Yes    Partners: Male    Birth control/protection: None  Other Topics Concern   Not on file  Social History Narrative   Not on file   Social Determinants of Health   Financial Resource Strain: Not on file  Food Insecurity: No Food Insecurity (05/23/2020)   Hunger Vital Sign    Worried About Running Out of Food in the Last Year: Never true    Ran Out of Food in the Last Year: Never true  Transportation Needs: Not on file  Physical Activity: Not on file  Stress: Not on file  Social Connections: Not on file    Family History  Problem Relation Age of Onset   Hypertension Mother     Thyroid disease Mother    GER disease Father    Migraines Father    Heart attack Other    Thyroid disease Brother     Health Maintenance  Topic Date Due   FOOT EXAM  Never done   OPHTHALMOLOGY EXAM  Never done   Diabetic kidney evaluation - Urine ACR  Never done   COVID-19 Vaccine (2 - 2023-24 season) 07/01/2022 (Originally 02/23/2022)   PAP SMEAR-Modifier  07/26/2022 (Originally 08/09/2007)   INFLUENZA VACCINE  09/23/2022 (Originally 01/23/2022)   HEMOGLOBIN A1C  12/12/2022   COLONOSCOPY (Pts 45-50yr Insurance coverage will need to be confirmed)  02/07/2023   Diabetic kidney evaluation - eGFR measurement  06/13/2023   DTaP/Tdap/Td (3 - Td or Tdap) 05/03/2030   Hepatitis C Screening  Completed   HIV Screening  Completed   HPV VACCINES  Aged Out     ----------------------------------------------------------------------------------------------------------------------------------------------------------------------------------------------------------------- Physical Exam BP 117/77 (BP Location: Right Arm, Patient Position: Sitting, Cuff Size: Large)   Pulse 79   Ht _0  (1.753 m)   Wt (!) 350 lb (158.8 kg)   SpO2 95%   BMI 51.69 kg/m   Physical Exam Constitutional:      Appearance: She is well-developed.  HENT:     Head: Normocephalic and atraumatic.     Mouth/Throat:     Mouth: Mucous membranes are moist.  Eyes:     General: No scleral icterus. Cardiovascular:     Rate and Rhythm: Normal rate and regular rhythm.  Pulmonary:     Effort: Pulmonary effort is normal.     Breath sounds: Normal breath sounds.  Musculoskeletal:     Cervical back: Neck supple.  Neurological:     Mental Status: She is alert.  Psychiatric:        Mood and Affect: Mood normal.        Behavior: Behavior normal.      ------------------------------------------------------------------------------------------------------------------------------------------------------------------------------------------------------------------- Assessment and Plan  Pharyngitis No improvement with azithromycin.  Doubt strep is cause.  We discussed alternative causes.  Reflux may be contributing.  Additionally, we will provide trial of nystatin given her uncontrolled diabetes as well as noticing some white patches in the back of her throat.  Recommend warm salt water gargles as needed.  Regular use of CPAP may be helpful as well.  Type 2 diabetes mellitus with hyperglycemia (HCC) Diabetes is poorly controlled at this time.  We discussed working on dietary changes to help with management of blood sugars.  Adding metformin XR 500 mg daily.  She will let me know if not tolerating.  We discussed that once her daughter is  weaning from breast-feeding we can consider additional medications for management of glucose.   Meds ordered this encounter  Medications   metFORMIN (GLUCOPHAGE-XR) 500 MG 24 hr tablet    Sig: Take 1 tablet (500 mg total) by mouth daily with breakfast.    Dispense:  90 tablet    Refill:  0   nystatin (MYCOSTATIN) 100000 UNIT/ML suspension    Sig: Use as directed 5 mLs (500,000 Units total) in the mouth or throat 4 (four) times daily for 7 days.    Dispense:  150 mL    Refill:  0   Continuous Blood Gluc Receiver (FREESTYLE LIBRE 2 READER) DEVI    Sig: Use to check glucose as needed.    Dispense:  1 each    Refill:  0   Continuous Blood Gluc Sensor (FREESTYLE LIBRE 2 SENSOR) MISC    Sig: Use to check glucose as needed.  Change every 14 days.    Dispense:  6 each    Refill:  1    Return in about 3 months (around 09/14/2022) for T2DM.    This visit occurred during the SARS-CoV-2 public health emergency.  Safety protocols were in place, including screening questions prior to the visit, additional usage  of staff PPE, and extensive cleaning of exam room while observing appropriate contact time as indicated for disinfecting solutions.

## 2022-06-18 NOTE — Assessment & Plan Note (Signed)
Diabetes is poorly controlled at this time.  We discussed working on dietary changes to help with management of blood sugars.  Adding metformin XR 500 mg daily.  She will let me know if not tolerating.  We discussed that once her daughter is weaning from breast-feeding we can consider additional medications for management of glucose.

## 2022-06-18 NOTE — Progress Notes (Signed)
Ariana Parks - 41 y.o. female MRN [id number]  Date of birth: January 16, 1981  Subjective Chief Complaint  Patient presents with   Sore Throat    HPI Ariana Parks is a 41 year old female here today for follow-up visit.   She is having upcoming arthroscopic knee surgery.  During her preop examination she has noted to have elevated A1c in the diabetes range at 8.1%.  She admits that her diet is not very good.  Activity is fairly sedentary.  We have discussed GLP-1's in the past for treatment of her weight loss and her prediabetes.  She is still breast-feeding at this time however rather avoid these for now.  She would like to try continuous glucose monitor to get a better idea of what her blood sugars were going throughout the day.  She is having sore throat.  Normal sensation when swallowing.  She has noticed some white patches in the back of her throat.  Seen in urgent care previously and treated with azithromycin.  Symptoms did not really change or improved since that time.  No fever or chills.  Some reflux symptoms.  Currently using famotidine minimal improvement.  She does not want to use PPIs due to her breast-feeding.  ROS:  A comprehensive ROS was completed and negative except as noted per HPI  Allergies  Allergen Reactions   Citalopram     Other reaction(s): Bradycardia   Celexa [Citalopram Hydrobromide] Other (See Comments)    Hospitalized due to low heart rate   Ibuprofen Other (See Comments)    High doses (petechial bruising)   Prednisone Other (See Comments)    Shaking uncontrollably    Prilosec [Omeprazole] Other (See Comments)    Hospitalized due to low heart rate   Wellbutrin [Bupropion] Other (See Comments)    Lows seizure threshold    Flexeril [Cyclobenzaprine] Other (See Comments)    Bizarre dreams/exacerbate depression    Past Medical History:  Diagnosis Date   Abnormal Pap smear of cervix 2005   Mod dysplasia   Depression    Endometriosis    GERD  (gastroesophageal reflux disease)    Gestational diabetes mellitus (GDM) affecting first pregnancy    High cholesterol    Migraine headache    Nonocclusive mesenteric ischemia ([place]) 04/04/2018   Dx colonscopy Aug 2019 GAP   OSA (obstructive sleep apnea) 07/05/2016   Panic attack    PCOS (polycystic ovarian syndrome)    Pre-diabetes     Past Surgical History:  Procedure Laterality Date   CESAREAN SECTION N/A 07/24/2020   Procedure: PRIMARY CESAREAN SECTION;  Surgeon: Everlene Farrier, MD;  Location: [facility];  Service: Obstetrics;  Laterality: N/A;   KNEE SURGERY     LEEP     TONSILLECTOMY     WISDOM TOOTH EXTRACTION      Social History   Socioeconomic History   Marital status: Single    Spouse name: Not on file   Number of children: Not on file   Years of education: Not on file   Highest education level: Not on file  Occupational History   Occupation: Designer, jewellery  Tobacco Use   Smoking status: Former    Packs/day: 0.50    Years: 15.00    Total pack years: 7.50    Types: Cigarettes    Quit date: 05/25/2020    Years since quitting: 2.0   Smokeless tobacco: Never   Tobacco comments:    using vapes  Vaping Use   Vaping Use: Never used  Substance and Sexual Activity   Alcohol use: Not Currently    Alcohol/week: 0.0 standard drinks of alcohol   Drug use: No   Sexual activity: Yes    Partners: Male    Birth control/protection: None  Other Topics Concern   Not on file  Social History Narrative   Not on file   Social Determinants of Health   Financial Resource Strain: Not on file  Food Insecurity: No Food Insecurity (05/23/2020)   Hunger Vital Sign    Worried About Running Out of Food in the Last Year: Never true    Ran Out of Food in the Last Year: Never true  Transportation Needs: Not on file  Physical Activity: Not on file  Stress: Not on file  Social Connections: Not on file    Family History  Problem Relation Age of Onset   Hypertension Mother     Thyroid disease Mother    GER disease Father    Migraines Father    Heart attack Other    Thyroid disease Brother     Health Maintenance  Topic Date Due   FOOT EXAM  Never done   OPHTHALMOLOGY EXAM  Never done   Diabetic kidney evaluation - Urine ACR  Never done   COVID-19 Vaccine (2 - 2023-24 season) 07/01/2022 (Originally 02/23/2022)   PAP SMEAR-Modifier  07/26/2022 (Originally 08/09/2007)   INFLUENZA VACCINE  09/23/2022 (Originally 01/23/2022)   HEMOGLOBIN A1C  12/12/2022   COLONOSCOPY (Pts 45-50yr Insurance coverage will need to be confirmed)  02/07/2023   Diabetic kidney evaluation - eGFR measurement  06/13/2023   DTaP/Tdap/Td (3 - Td or Tdap) 05/03/2030   Hepatitis C Screening  Completed   HIV Screening  Completed   HPV VACCINES  Aged Out     ----------------------------------------------------------------------------------------------------------------------------------------------------------------------------------------------------------------- Physical Exam BP 117/77 (BP Location: Right Arm, Patient Position: Sitting, Cuff Size: Large)   Pulse 79   Ht _0  (1.753 m)   Wt (!) 350 lb (158.8 kg)   SpO2 95%   BMI 51.69 kg/m   Physical Exam Constitutional:      Appearance: She is well-developed.  HENT:     Head: Normocephalic and atraumatic.     Mouth/Throat:     Mouth: Mucous membranes are moist.  Eyes:     General: No scleral icterus. Cardiovascular:     Rate and Rhythm: Normal rate and regular rhythm.  Pulmonary:     Effort: Pulmonary effort is normal.     Breath sounds: Normal breath sounds.  Musculoskeletal:     Cervical back: Neck supple.  Neurological:     Mental Status: She is alert.  Psychiatric:        Mood and Affect: Mood normal.        Behavior: Behavior normal.      ------------------------------------------------------------------------------------------------------------------------------------------------------------------------------------------------------------------- Assessment and Plan  Pharyngitis No improvement with azithromycin.  Doubt strep is cause.  We discussed alternative causes.  Reflux may be contributing.  Additionally, we will provide trial of nystatin given her uncontrolled diabetes as well as noticing some white patches in the back of her throat.  Recommend warm salt water gargles as needed.  Regular use of CPAP may be helpful as well.  Type 2 diabetes mellitus with hyperglycemia (HCC) Diabetes is poorly controlled at this time.  We discussed working on dietary changes to help with management of blood sugars.  Adding metformin XR 500 mg daily.  She will let me know if not tolerating.  We discussed that once her daughter is  weaning from breast-feeding we can consider additional medications for management of glucose.   Meds ordered this encounter  Medications   metFORMIN (GLUCOPHAGE-XR) 500 MG 24 hr tablet    Sig: Take 1 tablet (500 mg total) by mouth daily with breakfast.    Dispense:  90 tablet    Refill:  0   nystatin (MYCOSTATIN) 100000 UNIT/ML suspension    Sig: Use as directed 5 mLs (500,000 Units total) in the mouth or throat 4 (four) times daily for 7 days.    Dispense:  150 mL    Refill:  0   Continuous Blood Gluc Receiver (FREESTYLE LIBRE 2 READER) DEVI    Sig: Use to check glucose as needed.    Dispense:  1 each    Refill:  0   Continuous Blood Gluc Sensor (FREESTYLE LIBRE 2 SENSOR) MISC    Sig: Use to check glucose as needed.  Change every 14 days.    Dispense:  6 each    Refill:  1    Return in about 3 months (around 09/14/2022) for T2DM.    This visit occurred during the SARS-CoV-2 public health emergency.  Safety protocols were in place, including screening questions prior to the visit, additional usage  of staff PPE, and extensive cleaning of exam room while observing appropriate contact time as indicated for disinfecting solutions.

## 2022-06-18 NOTE — Assessment & Plan Note (Signed)
No improvement with azithromycin.  Doubt strep is cause.  We discussed alternative causes.  Reflux may be contributing.  Additionally, we will provide trial of nystatin given her uncontrolled diabetes as well as noticing some white patches in the back of her throat.  Recommend warm salt water gargles as needed.  Regular use of CPAP may be helpful as well.

## 2022-06-20 ENCOUNTER — Ambulatory Visit (HOSPITAL_COMMUNITY): Payer: Medicaid Other | Admitting: Physician Assistant

## 2022-06-20 ENCOUNTER — Ambulatory Visit (HOSPITAL_COMMUNITY)
Admission: RE | Admit: 2022-06-20 | Discharge: 2022-06-20 | Disposition: A | Discharge status: Home / Self Care | Payer: Medicaid Other | Source: Ambulatory Visit | Attending: Specialist | Admitting: Specialist

## 2022-06-20 ENCOUNTER — Encounter (HOSPITAL_COMMUNITY): Payer: Self-pay | Admitting: Specialist

## 2022-06-20 ENCOUNTER — Encounter (HOSPITAL_COMMUNITY)
Admission: RE | Disposition: A | Discharge status: Home / Self Care | Payer: Self-pay | Source: Ambulatory Visit | Attending: Specialist

## 2022-06-20 ENCOUNTER — Other Ambulatory Visit: Payer: Self-pay

## 2022-06-20 ENCOUNTER — Ambulatory Visit (HOSPITAL_BASED_OUTPATIENT_CLINIC_OR_DEPARTMENT_OTHER): Payer: Medicaid Other | Admitting: Certified Registered Nurse Anesthetist

## 2022-06-20 DIAGNOSIS — Z7984 Long term (current) use of oral hypoglycemic drugs: Secondary | ICD-10-CM | POA: Insufficient documentation

## 2022-06-20 DIAGNOSIS — M94261 Chondromalacia, right knee: Secondary | ICD-10-CM | POA: Diagnosis not present

## 2022-06-20 DIAGNOSIS — F418 Other specified anxiety disorders: Secondary | ICD-10-CM

## 2022-06-20 DIAGNOSIS — S83281A Other tear of lateral meniscus, current injury, right knee, initial encounter: Secondary | ICD-10-CM | POA: Diagnosis not present

## 2022-06-20 DIAGNOSIS — G4733 Obstructive sleep apnea (adult) (pediatric): Secondary | ICD-10-CM | POA: Diagnosis not present

## 2022-06-20 DIAGNOSIS — Z87891 Personal history of nicotine dependence: Secondary | ICD-10-CM | POA: Diagnosis not present

## 2022-06-20 DIAGNOSIS — S83271A Complex tear of lateral meniscus, current injury, right knee, initial encounter: Secondary | ICD-10-CM | POA: Insufficient documentation

## 2022-06-20 DIAGNOSIS — Z6841 Body Mass Index (BMI) 40.0 and over, adult: Secondary | ICD-10-CM | POA: Insufficient documentation

## 2022-06-20 DIAGNOSIS — E1165 Type 2 diabetes mellitus with hyperglycemia: Secondary | ICD-10-CM | POA: Diagnosis not present

## 2022-06-20 DIAGNOSIS — X58XXXA Exposure to other specified factors, initial encounter: Secondary | ICD-10-CM | POA: Diagnosis not present

## 2022-06-20 DIAGNOSIS — Z01818 Encounter for other preprocedural examination: Secondary | ICD-10-CM

## 2022-06-20 DIAGNOSIS — M65862 Other synovitis and tenosynovitis, left lower leg: Secondary | ICD-10-CM | POA: Diagnosis not present

## 2022-06-20 DIAGNOSIS — M23261 Derangement of other lateral meniscus due to old tear or injury, right knee: Secondary | ICD-10-CM | POA: Diagnosis not present

## 2022-06-20 DIAGNOSIS — R7303 Prediabetes: Secondary | ICD-10-CM

## 2022-06-20 DIAGNOSIS — E119 Type 2 diabetes mellitus without complications: Secondary | ICD-10-CM

## 2022-06-20 DIAGNOSIS — S8331XA Tear of articular cartilage of right knee, current, initial encounter: Secondary | ICD-10-CM | POA: Diagnosis not present

## 2022-06-20 HISTORY — PX: KNEE ARTHROSCOPY WITH LATERAL MENISECTOMY: SHX6193

## 2022-06-20 LAB — GLUCOSE, CAPILLARY: Glucose-Capillary: 179 mg/dL — ABNORMAL HIGH (ref 70–99)

## 2022-06-20 LAB — POCT PREGNANCY, URINE: Preg Test, Ur: NEGATIVE

## 2022-06-20 SURGERY — ARTHROSCOPY, KNEE, WITH LATERAL MENISCECTOMY
Anesthesia: General | Site: Knee | Laterality: Right

## 2022-06-20 MED ORDER — AMISULPRIDE (ANTIEMETIC) 5 MG/2ML IV SOLN: 10.0000 mg | Freq: Once | INTRAVENOUS | Status: DC | PRN

## 2022-06-20 MED ORDER — ACETAMINOPHEN 500 MG PO TABS
1000.0000 mg | ORAL_TABLET | Freq: Once | ORAL | Status: AC
  Administered 2022-06-20: 1000 mg via ORAL
  Filled 2022-06-20: qty 2

## 2022-06-20 MED ORDER — KETOROLAC TROMETHAMINE 30 MG/ML IJ SOLN
INTRAMUSCULAR | Status: AC
  Administered 2022-06-20: 30 mg via INTRAVENOUS
  Filled 2022-06-20: qty 1

## 2022-06-20 MED ORDER — OXYCODONE HCL 5 MG PO TABS: 5.0000 mg | ORAL_TABLET | Freq: Once | ORAL | Status: AC | PRN

## 2022-06-20 MED ORDER — DEXMEDETOMIDINE HCL IN NACL 200 MCG/50ML IV SOLN
INTRAVENOUS | Status: DC | PRN
  Administered 2022-06-20: 12 ug via INTRAVENOUS

## 2022-06-20 MED ORDER — ORAL CARE MOUTH RINSE: 15.0000 mL | Freq: Once | OROMUCOSAL | Status: AC

## 2022-06-20 MED ORDER — MIDAZOLAM HCL 5 MG/5ML IJ SOLN
INTRAMUSCULAR | Status: DC | PRN
  Administered 2022-06-20: 2 mg via INTRAVENOUS

## 2022-06-20 MED ORDER — LIDOCAINE 2% (20 MG/ML) 5 ML SYRINGE
INTRAMUSCULAR | Status: DC | PRN
  Administered 2022-06-20: 100 mg via INTRAVENOUS

## 2022-06-20 MED ORDER — PROMETHAZINE HCL 25 MG/ML IJ SOLN: 6.2500 mg | INTRAMUSCULAR | Status: DC | PRN

## 2022-06-20 MED ORDER — OXYCODONE HCL 5 MG PO TABS
ORAL_TABLET | ORAL | Status: AC
  Administered 2022-06-20: 5 mg via ORAL
  Filled 2022-06-20: qty 1

## 2022-06-20 MED ORDER — DEXAMETHASONE SODIUM PHOSPHATE 10 MG/ML IJ SOLN
INTRAMUSCULAR | Status: AC
  Filled 2022-06-20: qty 1

## 2022-06-20 MED ORDER — OXYCODONE HCL 5 MG/5ML PO SOLN: 5.0000 mg | Freq: Once | ORAL | Status: AC | PRN

## 2022-06-20 MED ORDER — HYDROMORPHONE HCL 1 MG/ML IJ SOLN: 0.2500 mg | INTRAMUSCULAR | Status: DC | PRN

## 2022-06-20 MED ORDER — MIDAZOLAM HCL 2 MG/2ML IJ SOLN
INTRAMUSCULAR | Status: AC
  Filled 2022-06-20: qty 2

## 2022-06-20 MED ORDER — DEXAMETHASONE SODIUM PHOSPHATE 10 MG/ML IJ SOLN
INTRAMUSCULAR | Status: DC | PRN
  Administered 2022-06-20: 5 mg via INTRAVENOUS

## 2022-06-20 MED ORDER — SODIUM CHLORIDE 0.9 % IR SOLN
Status: DC | PRN
  Administered 2022-06-20: 6000 mL

## 2022-06-20 MED ORDER — PROPOFOL 10 MG/ML IV BOLUS
INTRAVENOUS | Status: AC
  Filled 2022-06-20: qty 20

## 2022-06-20 MED ORDER — ONDANSETRON HCL 4 MG/2ML IJ SOLN
INTRAMUSCULAR | Status: DC | PRN
  Administered 2022-06-20: 4 mg via INTRAVENOUS

## 2022-06-20 MED ORDER — ONDANSETRON HCL 4 MG PO TABS: 4.0000 mg | ORAL_TABLET | Freq: Every day | ORAL | 1 refills | Status: DC | PRN

## 2022-06-20 MED ORDER — METHOCARBAMOL 500 MG PO TABS: 500.0000 mg | ORAL_TABLET | Freq: Four times a day (QID) | ORAL | 0 refills | Status: DC

## 2022-06-20 MED ORDER — MIDAZOLAM HCL 2 MG/2ML IJ SOLN
1.0000 mg | INTRAMUSCULAR | Status: DC
  Filled 2022-06-20: qty 2

## 2022-06-20 MED ORDER — PHENYLEPHRINE 80 MCG/ML (10ML) SYRINGE FOR IV PUSH (FOR BLOOD PRESSURE SUPPORT)
PREFILLED_SYRINGE | INTRAVENOUS | Status: DC | PRN
  Administered 2022-06-20 (×2): 80 ug via INTRAVENOUS

## 2022-06-20 MED ORDER — PROPOFOL 10 MG/ML IV BOLUS
INTRAVENOUS | Status: DC | PRN
  Administered 2022-06-20: 270 mg via INTRAVENOUS

## 2022-06-20 MED ORDER — HYDROMORPHONE HCL 1 MG/ML IJ SOLN
INTRAMUSCULAR | Status: AC
  Administered 2022-06-20: 0.25 mg via INTRAVENOUS
  Filled 2022-06-20: qty 1

## 2022-06-20 MED ORDER — FENTANYL CITRATE (PF) 100 MCG/2ML IJ SOLN
INTRAMUSCULAR | Status: DC | PRN
  Administered 2022-06-20 (×3): 50 ug via INTRAVENOUS

## 2022-06-20 MED ORDER — ASPIRIN 81 MG PO TBEC: 81.0000 mg | DELAYED_RELEASE_TABLET | Freq: Two times a day (BID) | ORAL | 0 refills | Status: AC

## 2022-06-20 MED ORDER — CEPHALEXIN 500 MG PO CAPS: 500.0000 mg | ORAL_CAPSULE | Freq: Four times a day (QID) | ORAL | 0 refills | Status: AC

## 2022-06-20 MED ORDER — SUGAMMADEX SODIUM 500 MG/5ML IV SOLN
INTRAVENOUS | Status: AC
  Filled 2022-06-20: qty 5

## 2022-06-20 MED ORDER — FENTANYL CITRATE PF 50 MCG/ML IJ SOSY
50.0000 ug | PREFILLED_SYRINGE | INTRAMUSCULAR | Status: DC
  Filled 2022-06-20: qty 2

## 2022-06-20 MED ORDER — MEPERIDINE HCL 50 MG/ML IJ SOLN: 6.2500 mg | INTRAMUSCULAR | Status: DC | PRN

## 2022-06-20 MED ORDER — ONDANSETRON HCL 4 MG/2ML IJ SOLN
INTRAMUSCULAR | Status: AC
  Filled 2022-06-20: qty 2

## 2022-06-20 MED ORDER — BUPIVACAINE HCL 0.25 % IJ SOLN
INTRAMUSCULAR | Status: DC | PRN
  Administered 2022-06-20: 30 mL

## 2022-06-20 MED ORDER — LACTATED RINGERS IV SOLN: INTRAVENOUS | Status: DC

## 2022-06-20 MED ORDER — CEFAZOLIN IN SODIUM CHLORIDE 3-0.9 GM/100ML-% IV SOLN
3.0000 g | INTRAVENOUS | Status: AC
  Administered 2022-06-20: 3 g via INTRAVENOUS
  Filled 2022-06-20: qty 100

## 2022-06-20 MED ORDER — ROCURONIUM BROMIDE 10 MG/ML (PF) SYRINGE
PREFILLED_SYRINGE | INTRAVENOUS | Status: DC | PRN
  Administered 2022-06-20: 40 mg via INTRAVENOUS

## 2022-06-20 MED ORDER — SUGAMMADEX SODIUM 200 MG/2ML IV SOLN
INTRAVENOUS | Status: DC | PRN
  Administered 2022-06-20: 400 mg via INTRAVENOUS

## 2022-06-20 MED ORDER — BUPIVACAINE HCL (PF) 0.25 % IJ SOLN
INTRAMUSCULAR | Status: AC
  Filled 2022-06-20: qty 30

## 2022-06-20 MED ORDER — FENTANYL CITRATE (PF) 100 MCG/2ML IJ SOLN
INTRAMUSCULAR | Status: AC
  Filled 2022-06-20: qty 2

## 2022-06-20 MED ORDER — OXYCODONE HCL 5 MG PO TABS: 5.0000 mg | ORAL_TABLET | ORAL | 0 refills | Status: DC | PRN

## 2022-06-20 MED ORDER — CHLORHEXIDINE GLUCONATE 0.12 % MT SOLN
15.0000 mL | Freq: Once | OROMUCOSAL | Status: AC
  Administered 2022-06-20: 15 mL via OROMUCOSAL

## 2022-06-20 MED ORDER — SUCCINYLCHOLINE CHLORIDE 200 MG/10ML IV SOSY
PREFILLED_SYRINGE | INTRAVENOUS | Status: DC | PRN
  Administered 2022-06-20: 180 mg via INTRAVENOUS

## 2022-06-20 MED ORDER — LIDOCAINE HCL (PF) 2 % IJ SOLN
INTRAMUSCULAR | Status: AC
  Filled 2022-06-20: qty 5

## 2022-06-20 MED ORDER — KETOROLAC TROMETHAMINE 30 MG/ML IJ SOLN: 30.0000 mg | Freq: Once | INTRAMUSCULAR | Status: AC | PRN

## 2022-06-20 SURGICAL SUPPLY — 40 items
BAG COUNTER SPONGE SURGICOUNT (BAG) ×2 IMPLANT
BAG SPNG CNTER NS LX DISP (BAG) ×1
BANDAGE ESMARK 6X9 LF (GAUZE/BANDAGES/DRESSINGS) IMPLANT
BLADE EXCALIBUR 4.0X13 (MISCELLANEOUS) ×2 IMPLANT
BNDG CMPR 9X6 STRL LF SNTH (GAUZE/BANDAGES/DRESSINGS)
BNDG CMPR MED 10X6 ELC LF (GAUZE/BANDAGES/DRESSINGS) ×1
BNDG ELASTIC 6X10 VLCR STRL LF (GAUZE/BANDAGES/DRESSINGS) IMPLANT
BNDG ESMARK 6X9 LF (GAUZE/BANDAGES/DRESSINGS)
BNDG GAUZE DERMACEA FLUFF 4 (GAUZE/BANDAGES/DRESSINGS) ×4 IMPLANT
BNDG GZE DERMACEA 4 6PLY (GAUZE/BANDAGES/DRESSINGS) ×1
CANISTER SUCT 3000ML PPV (MISCELLANEOUS) ×2 IMPLANT
CUFF TOURN SGL QUICK 34 (TOURNIQUET CUFF) ×1
CUFF TRNQT CYL 34X4.125X (TOURNIQUET CUFF) ×2 IMPLANT
DRAPE INCISE 23X17 IOBAN STRL (DRAPES) ×1
DRAPE INCISE 23X17 STRL (DRAPES) ×2 IMPLANT
DRAPE INCISE IOBAN 23X17 STRL (DRAPES) ×1 IMPLANT
DRAPE INCISE IOBAN 66X45 STRL (DRAPES) ×2 IMPLANT
DRAPE U-SHAPE 47X51 STRL (DRAPES) ×2 IMPLANT
DURAPREP 26ML APPLICATOR (WOUND CARE) ×2 IMPLANT
EXCALIBUR 3.8MM X 13CM (MISCELLANEOUS) ×2 IMPLANT
GAUZE PAD ABD 8X10 STRL (GAUZE/BANDAGES/DRESSINGS) ×4 IMPLANT
GAUZE SPONGE 4X4 12PLY STRL (GAUZE/BANDAGES/DRESSINGS) ×2 IMPLANT
GAUZE XEROFORM 1X8 LF (GAUZE/BANDAGES/DRESSINGS) ×2 IMPLANT
GLOVE BIOGEL PI IND STRL 7.5 (GLOVE) ×2 IMPLANT
GLOVE BIOGEL PI IND STRL 8 (GLOVE) ×2 IMPLANT
GLOVE ECLIPSE 8.0 STRL XLNG CF (GLOVE) ×2 IMPLANT
GLOVE SURG SS PI 7.0 STRL IVOR (GLOVE) ×2 IMPLANT
KIT BASIN OR (CUSTOM PROCEDURE TRAY) ×2 IMPLANT
KIT TURNOVER KIT A (KITS) IMPLANT
MANIFOLD NEPTUNE II (INSTRUMENTS) IMPLANT
PACK ARTHROSCOPY DSU (CUSTOM PROCEDURE TRAY) ×2 IMPLANT
PAD ARMBOARD 7.5X6 YLW CONV (MISCELLANEOUS) IMPLANT
PADDING CAST ABS COTTON 4X4 ST (CAST SUPPLIES) ×2 IMPLANT
SUT ETHILON 4 0 PS 2 18 (SUTURE) ×2 IMPLANT
SYR CONTROL 10ML LL (SYRINGE) ×2 IMPLANT
TOWEL OR 17X26 10 PK STRL BLUE (TOWEL DISPOSABLE) ×4 IMPLANT
TUBING ARTHROSCOPY IRRIG 16FT (MISCELLANEOUS) ×2 IMPLANT
TUBING CONNECTING 10 (TUBING) ×2 IMPLANT
WAND APOLLORF SJ50 AR-9845 (SURGICAL WAND) ×2 IMPLANT
WATER STERILE IRR 500ML POUR (IV SOLUTION) ×2 IMPLANT

## 2022-06-20 NOTE — Transfer of Care (Signed)
Immediate Anesthesia Transfer of Care Note  Patient: Ariana Parks  Procedure(s) Performed: KNEE ARTHROSCOPY WITH PARTIAL  LATERAL MENISECTOMY AND CHONDROPLASTY (Right: Knee)  Patient Location: PACU  Anesthesia Type:General  Level of Consciousness: drowsy  Airway & Oxygen Therapy: Patient Spontanous Breathing and Patient connected to face mask oxygen  Post-op Assessment: Report given to RN and Post -op Vital signs reviewed and stable  Post vital signs: Reviewed and stable  Last Vitals:  Vitals Value Taken Time  BP 127/75 06/20/22 1152  Temp    Pulse 73 06/20/22 1156  Resp 17 06/20/22 1156  SpO2 91 % 06/20/22 1156  Vitals shown include unvalidated device data.  Last Pain:  Vitals:   06/20/22 0826  TempSrc:   PainSc: 4       Patients Stated Pain Goal: 4 (92/95/74 7340)  Complications: No notable events documented.

## 2022-06-20 NOTE — H&P (Signed)
Ariana Parks is an 41 y.o. female.   Chief Complaint: Right knee pain  HPI: Pleasant 41 year old female who has been complaining of right knee pain for a few years now. She has tried cortisone injections and got temporary relief. She has also tried home exercises. She got an MRI scan that showed a lateral meniscus tear.   Past Medical History:  Diagnosis Date   Abnormal Pap smear of cervix 2005   Mod dysplasia   Depression    Endometriosis    GERD (gastroesophageal reflux disease)    Gestational diabetes mellitus (GDM) affecting first pregnancy    High cholesterol    Migraine headache    Nonocclusive mesenteric ischemia ([place]) 04/04/2018   Dx colonscopy Aug 2019 GAP   OSA (obstructive sleep apnea) 07/05/2016   Panic attack    PCOS (polycystic ovarian syndrome)    Pre-diabetes     Past Surgical History:  Procedure Laterality Date   CESAREAN SECTION N/A 07/24/2020   Procedure: PRIMARY CESAREAN SECTION;  Surgeon: Everlene Farrier, MD;  Location: [facility];  Service: Obstetrics;  Laterality: N/A;   KNEE SURGERY     LEEP     TONSILLECTOMY     WISDOM TOOTH EXTRACTION      Family History  Problem Relation Age of Onset   Hypertension Mother    Thyroid disease Mother    GER disease Father    Migraines Father    Heart attack Other    Thyroid disease Brother    Social History:  reports that she quit smoking about 2 years ago. Her smoking use included cigarettes. She has a 7.50 pack-year smoking history. She has never used smokeless tobacco. She reports that she does not currently use alcohol. She reports that she does not use drugs.  Allergies:  Allergies  Allergen Reactions   Citalopram     Other reaction(s): Bradycardia   Celexa [Citalopram Hydrobromide] Other (See Comments)    Hospitalized due to low heart rate   Ibuprofen Other (See Comments)    High doses (petechial bruising)   Prednisone Other (See Comments)    Shaking uncontrollably    Prilosec [Omeprazole] Other  (See Comments)    Hospitalized due to low heart rate   Wellbutrin [Bupropion] Other (See Comments)    Lows seizure threshold    Flexeril [Cyclobenzaprine] Other (See Comments)    Bizarre dreams/exacerbate depression    No medications prior to admission.    No results found for this or any previous visit (from the past 48 hour(s)). No results found.  Review of Systems  All other systems reviewed and are negative.   currently breastfeeding. Physical Exam Musculoskeletal:     Comments: On examination of the right knee, positive Lachman. Positive lateral McMurray test. Cruciate and collateral ligaments are stable. Popliteal fossa is benign. Range of motion is 3 to 120 degrees.       Assessment/Plan Right knee lateral meniscus tear   Patient has a right knee lateral meniscus tear. She has failed conservative treatment options. Surgical vs nonsurgical management was discussed. She has elected to proceed. Risks and benefits have been discussed. She will have a right knee arthroscopy with a PLM and chondroplasty.   Drue Novel, PA 06/20/2022, 7:25 AM

## 2022-06-20 NOTE — Discharge Instructions (Signed)
Right knee scope with partial lateral menisectomy and chondroplasty: -Please take aspirin 81 mg twice a day 1 in the morning 1 in the evening -Okay to take Tylenol 1000 mg 2-3 times a day with pain medication -Please ice and elevate the leg -Okay to start physical therapy 1 week after surgery  -Please follow-up in the office in 2 weeks -Please take a stool softener while taking opioid pain medication -Please take a probiotic while taking antibiotics for 3 days -Okay to remove bandages in 3 days and replace with bandaids, keep incision site clean

## 2022-06-20 NOTE — Anesthesia Postprocedure Evaluation (Signed)
Anesthesia Post Note  Patient: Ariana Parks  Procedure(s) Performed: KNEE ARTHROSCOPY WITH PARTIAL  LATERAL MENISECTOMY AND CHONDROPLASTY (Right: Knee)     Patient location during evaluation: PACU Anesthesia Type: General Level of consciousness: awake and alert Pain management: pain level controlled Vital Signs Assessment: post-procedure vital signs reviewed and stable Respiratory status: spontaneous breathing, nonlabored ventilation, respiratory function stable and patient connected to nasal cannula oxygen Cardiovascular status: blood pressure returned to baseline and stable Postop Assessment: no apparent nausea or vomiting Anesthetic complications: no  No notable events documented.  Last Vitals:  Vitals:   06/20/22 1215 06/20/22 1230  BP: 116/61 111/79  Pulse: 78 84  Resp: 17 14  Temp:    SpO2: 94% 94%    Last Pain:  Vitals:   06/20/22 1230  TempSrc:   PainSc: 7                  Jaclynne Baldo S

## 2022-06-20 NOTE — Op Note (Signed)
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  New  Operative details Patient was encountered in the holding area correct side identified marked signed appropriate discharged sedation given and IV antibiotics were given within 1 hour of the surgical incision time.  Patient was taken the operating room placed under general anesthesia per the anesthesiologist obtained the case.  Following this the right thigh was elevated.  DuraPrep and draped in sterile fashion timeout to confirm the right side and.  [place] was introduced grade 3 4 chondromalacia of the patellofemoral joint with multiple stable chondral flaps or light chondroplasty was performed back to stable edges.  Suprapatellar pouch medial and lateral gutters left synovitis.  ACL PCL intact medial side inspected also was encountered grade III and IV chondromalacia with unstable chondral flaps in the medial compartment and a shaver was utilized to perform a light chondroplasty.  Lateral side inspected complex tear of the mid one third lateral meniscus utilizing baskets motorized shaver cautery a partial lateral meniscectomy was performed.  Grade 3 4 chondromalacia and light chondroplasty was performed with a shaver.  No other noted irrigated irrigated scope clip was removed.  Portals were closed with 4-0 suture and another 10 cc Marcaine was utilized.  To help with suprapatellar pouch reviewing and instrumentation accessory portal was made posteromedial into the suprapatellar pouch.  This was also done with knee localization.   Patient was taken to the operating room to PACU in stable condition.  Should be stabilized in PACU discharged home.

## 2022-06-20 NOTE — Anesthesia Preprocedure Evaluation (Addendum)
Anesthesia Evaluation  Patient identified by MRN, date of birth, ID band Patient awake    Reviewed: Allergy & Precautions, NPO status , Patient's Chart, lab work & pertinent test results  Airway Mallampati: III  TM Distance: >3 FB Neck ROM: Full    Dental  (+) Dental Advisory Given, Poor Dentition, Loose,    Pulmonary sleep apnea , Patient abstained from smoking., former smoker   Pulmonary exam normal breath sounds clear to auscultation       Cardiovascular negative cardio ROS Normal cardiovascular exam Rhythm:Regular Rate:Normal     Neuro/Psych  Headaches PSYCHIATRIC DISORDERS Anxiety Depression     Neuromuscular disease    GI/Hepatic Neg liver ROS,GERD  Controlled,,  Endo/Other  diabetes  Morbid obesity  Renal/GU negative Renal ROS     Musculoskeletal negative musculoskeletal ROS (+)    Abdominal  (+) + obese  Peds  Hematology negative hematology ROS (+)   Anesthesia Other Findings   Reproductive/Obstetrics (+) Breast feeding                              Anesthesia Physical Anesthesia Plan  ASA: 3  Anesthesia Plan: General   Post-op Pain Management: Tylenol PO (pre-op)* and Toradol IV (intra-op)*   Induction: Intravenous  PONV Risk Score and Plan: 4 or greater and Ondansetron, Dexamethasone, Treatment may vary due to age or medical condition and Midazolam  Airway Management Planned: Oral ETT and Video Laryngoscope Planned  Additional Equipment:   Intra-op Plan:   Post-operative Plan: Extubation in OR  Informed Consent: I have reviewed the patients History and Physical, chart, labs and discussed the procedure including the risks, benefits and alternatives for the proposed anesthesia with the patient or authorized representative who has indicated his/her understanding and acceptance.     Dental advisory given  Plan Discussed with: CRNA  Anesthesia Plan Comments:         Anesthesia Quick Evaluation

## 2022-06-20 NOTE — Progress Notes (Signed)
Orthopedic Tech Progress Note Patient Details:  Ariana Parks 07-12-1980 [id number]  Patient ID: [name], female   DOB: [birth year], 41 y.o.   MRN: [id number]  [name] 06/20/2022, 2:24 PM Crutches adjusted and given to patient in PACU

## 2022-06-20 NOTE — Anesthesia Procedure Notes (Signed)
Procedure Name: Intubation Date/Time: 06/20/2022 10:57 AM  Performed by: West Pugh, CRNAPre-anesthesia Checklist: Patient identified, Emergency Drugs available, Suction available, Patient being monitored and Timeout performed Patient Re-evaluated:Patient Re-evaluated prior to induction Oxygen Delivery Method: Circle system utilized Preoxygenation: Pre-oxygenation with 100% oxygen Induction Type: IV induction and Rapid sequence Laryngoscope Size: 3 and Glidescope Grade View: Grade I Tube type: Oral Tube size: 7.0 mm Number of attempts: 1 Airway Equipment and Method: Stylet Placement Confirmation: ETT inserted through vocal cords under direct vision, positive ETCO2, CO2 detector and breath sounds checked- equal and bilateral Secured at: 21 cm Tube secured with: Tape Dental Injury: Teeth and Oropharynx as per pre-operative assessment  Comments: AOI. Extremely loose front bottom teeth. Reiterated the dental advisory with patient.

## 2022-06-20 NOTE — Interval H&P Note (Signed)
History and Physical Interval Note:  06/20/2022 7:56 AM  Ariana Parks  has presented today for surgery, with the diagnosis of Right knee lateral meniscus tear, chondral flaps.  The various methods of treatment have been discussed with the patient and family. After consideration of risks, benefits and other options for treatment, the patient has consented to  Procedure(s): KNEE ARTHROSCOPY WITH PARTIAL  LATERAL MENISECTOMY AND CHONDROPLASTY (Right) as a surgical intervention.  The patient's history has been reviewed, patient examined, no change in status, stable for surgery.  I have reviewed the patient's chart and labs.  Questions were answered to the patient's satisfaction.     Victor Granados ANDREW

## 2022-06-21 ENCOUNTER — Encounter (HOSPITAL_COMMUNITY): Payer: Self-pay | Admitting: Specialist

## 2022-06-27 DIAGNOSIS — M25561 Pain in right knee: Secondary | ICD-10-CM | POA: Diagnosis not present

## 2022-06-28 ENCOUNTER — Telehealth: Payer: Self-pay

## 2022-06-28 NOTE — Telephone Encounter (Signed)
Please contact and see what her concerns are regarding metformin.  Ariana Parks may not be covered by some insurances unless patient is on insulin.

## 2022-06-28 NOTE — Telephone Encounter (Signed)
Pt lvm stating [facility] will not cover Libre 2.   Also, has a concern about metformin dosage.   Please advise.

## 2022-06-29 ENCOUNTER — Other Ambulatory Visit: Payer: Self-pay | Admitting: Family Medicine

## 2022-07-02 ENCOUNTER — Telehealth: Payer: Self-pay

## 2022-07-02 ENCOUNTER — Encounter: Payer: Self-pay | Admitting: Family Medicine

## 2022-07-02 DIAGNOSIS — E119 Type 2 diabetes mellitus without complications: Secondary | ICD-10-CM

## 2022-07-02 NOTE — Telephone Encounter (Signed)
[facility]. Written by historical provider

## 2022-07-02 NOTE — Telephone Encounter (Signed)
Initiated Prior authorization XYI:[id number] Libre 2 Reader device Via: Covermymeds Case/Key:[id number] Status: approved  as of 07/02/21 Reason: Coverage Starts on: 07/02/2022 12:00:00 AM, Coverage Ends on: 12/29/2022  Notified Pt via: Mychart   Initiated Prior authorization OLM:[id number] Libre 2 Reader device Via: Covermymeds Case/Key:[id number]  Status: approved  as of 07/02/21 Reason: Coverage Starts on: 07/02/2022 12:00:00 AM, Coverage Ends on: 12/29/2022  Notified Pt via: Mychart

## 2022-07-04 NOTE — Telephone Encounter (Signed)
Orders completed

## 2022-07-05 ENCOUNTER — Ambulatory Visit: Payer: Medicaid Other | Admitting: Podiatry

## 2022-07-05 ENCOUNTER — Encounter: Payer: Self-pay | Admitting: Podiatry

## 2022-07-05 DIAGNOSIS — E1165 Type 2 diabetes mellitus with hyperglycemia: Secondary | ICD-10-CM

## 2022-07-05 DIAGNOSIS — G63 Polyneuropathy in diseases classified elsewhere: Secondary | ICD-10-CM

## 2022-07-05 NOTE — Progress Notes (Signed)
  Subjective:  Patient ID: [name], female    DOB: 18-Sep-1980,   MRN: [id number]  Chief Complaint  Patient presents with   Nail Problem    Diabetic foot exam    42 y.o. female presents for diabetic foot exam. Relates she has been getting numbness in her toes and relates it is mostly at night when she is laying down. Denies any burning or tingling  Her last A1c was 8.1. Relates she just underwent knee surgery and still recovering.  Denies any other pedal complaints. Denies n/v/f/c.   Past Medical History:  Diagnosis Date   Abnormal Pap smear of cervix 2005   Mod dysplasia   Depression    Endometriosis    GERD (gastroesophageal reflux disease)    Gestational diabetes mellitus (GDM) affecting first pregnancy    High cholesterol    Migraine headache    Nonocclusive mesenteric ischemia ([place]) 04/04/2018   Dx colonscopy Aug 2019 [facility]   OSA (obstructive sleep apnea) 07/05/2016   Panic attack    PCOS (polycystic ovarian syndrome)    Pre-diabetes     Objective:  Physical Exam: Vascular: DP/PT pulses 2/4 bilateral. CFT <3 seconds. Absent hair growth on digits. Edema noted to bilateral lower extremities. Xerosis noted bilaterally.  Skin. No lacerations or abrasions bilateral feet. Nails 1-5 bilateral are normal in appearance Musculoskeletal: MMT 5/5 bilateral lower extremities in DF, PF, Inversion and Eversion. Deceased ROM in DF of ankle joint.  Neurological: Sensation intact to light touch. Protective sensation diminished bilateral.    Assessment:   1. Type 2 diabetes mellitus with hyperglycemia, without long-term current use of insulin ([place])   2. Polyneuropathy associated with underlying disease ([place])      Plan:  Patient was evaluated and treated and all questions answered. -Discussed and educated patient on diabetic foot care, especially with  regards to the vascular, neurological and musculoskeletal systems.  -Stressed the importance of good glycemic control and the  detriment of not  controlling glucose levels in relation to the foot. -Discussed supportive shoes at all times and checking feet regularly.  -Answered all patient questions -Patient to return  in 1 year for diabetic foot check.  -Patient advised to call the office if any problems or questions arise in the meantime.   Lorenda Peck, DPM

## 2022-07-06 ENCOUNTER — Other Ambulatory Visit: Payer: Self-pay | Admitting: Family Medicine

## 2022-07-06 DIAGNOSIS — M25561 Pain in right knee: Secondary | ICD-10-CM | POA: Diagnosis not present

## 2022-07-09 NOTE — Telephone Encounter (Signed)
[facility] requires a prior auth. Thanks in advance.

## 2022-07-10 DIAGNOSIS — M25561 Pain in right knee: Secondary | ICD-10-CM | POA: Diagnosis not present

## 2022-07-10 NOTE — Telephone Encounter (Signed)
Dr. Zigmund Daniel: requesting to increase Metformin. Currently taking '500mg'$  at night. Please advise.

## 2022-07-10 NOTE — Telephone Encounter (Signed)
If tolerating ok she may increase to '1000mg'$  nightly.   CM

## 2022-07-10 NOTE — Telephone Encounter (Signed)
Approved  PA Case: [id number], Coverage Starts on: 07/02/2022 12:00:00 AM, Coverage Ends on: 12/29/2022 12:00:00 AM.

## 2022-07-11 MED ORDER — METFORMIN HCL ER 500 MG PO TB24: 1000.0000 mg | ORAL_TABLET | Freq: Every day | ORAL | 0 refills | Status: DC

## 2022-07-11 NOTE — Addendum Note (Signed)
Addended by: Peggye Ley on: 07/11/2022 11:32 AM   Modules accepted: Orders

## 2022-07-13 DIAGNOSIS — M25561 Pain in right knee: Secondary | ICD-10-CM | POA: Diagnosis not present

## 2022-07-17 DIAGNOSIS — M25561 Pain in right knee: Secondary | ICD-10-CM | POA: Diagnosis not present

## 2022-07-19 DIAGNOSIS — M25561 Pain in right knee: Secondary | ICD-10-CM | POA: Diagnosis not present

## 2022-07-23 DIAGNOSIS — M25561 Pain in right knee: Secondary | ICD-10-CM | POA: Diagnosis not present

## 2022-07-27 DIAGNOSIS — M25561 Pain in right knee: Secondary | ICD-10-CM | POA: Diagnosis not present

## 2022-07-31 DIAGNOSIS — M25561 Pain in right knee: Secondary | ICD-10-CM | POA: Diagnosis not present

## 2022-08-10 DIAGNOSIS — M25561 Pain in right knee: Secondary | ICD-10-CM | POA: Diagnosis not present

## 2022-08-12 ENCOUNTER — Other Ambulatory Visit: Payer: Self-pay | Admitting: Family Medicine

## 2022-08-13 DIAGNOSIS — M25561 Pain in right knee: Secondary | ICD-10-CM | POA: Diagnosis not present

## 2022-08-17 ENCOUNTER — Other Ambulatory Visit: Payer: Self-pay | Admitting: Family Medicine

## 2022-08-20 DIAGNOSIS — M25561 Pain in right knee: Secondary | ICD-10-CM | POA: Diagnosis not present

## 2022-08-20 NOTE — Telephone Encounter (Signed)
Patient was told to increase to '1000mg'$  , should instructions be changed to show how to take medication,changed to 1000 mg will send to PCP for review and signature

## 2022-08-22 DIAGNOSIS — M25561 Pain in right knee: Secondary | ICD-10-CM | POA: Diagnosis not present

## 2022-08-23 ENCOUNTER — Ambulatory Visit: Payer: Medicaid Other | Admitting: Family Medicine

## 2022-08-23 ENCOUNTER — Ambulatory Visit: Payer: Medicaid Other | Admitting: Skilled Nursing Facility1

## 2022-08-27 DIAGNOSIS — M25561 Pain in right knee: Secondary | ICD-10-CM | POA: Diagnosis not present

## 2022-08-30 ENCOUNTER — Encounter: Payer: Medicaid Other | Attending: Family Medicine | Admitting: Skilled Nursing Facility1

## 2022-08-30 ENCOUNTER — Encounter: Payer: Self-pay | Admitting: Skilled Nursing Facility1

## 2022-08-30 VITALS — Ht 69.0 in | Wt 337.0 lb

## 2022-08-30 DIAGNOSIS — M25561 Pain in right knee: Secondary | ICD-10-CM | POA: Diagnosis not present

## 2022-08-30 DIAGNOSIS — E1165 Type 2 diabetes mellitus with hyperglycemia: Secondary | ICD-10-CM | POA: Diagnosis not present

## 2022-08-30 NOTE — Progress Notes (Signed)
Pt states she was doing weight watchers but has not due to recuperating from her knee surgery being about 3 months out. Pt states she has been working on watching her blood sugars and her portion sizes.  Pt states she is breast feeding for about 2 years working on getting her weaned off. Pt states she is a christian so is following the bible saying to breast feed for 98-41 years of age.   DM medication: Metformin   Other Dx: Depression GERD Hyperlipidemia  Migraine PCOS   Free style Elenor Legato had a malfunction so she needs a new one but has been pricking her finger.  Pt states she checks her blood sugar 3 times a day: fasting: 115-120's; 2 hours post prandial 117-130; before bed: 117-130. Pt states she feels dizzy and sluggish when her blood sugars are 170-over 200. Pt states she lives with her toddler and parents and her brother pops in a lot.  Pt states she and her mom does the food shopping and they also make the meals.  Pt states she takes a nap daily stating she does not sleep well at night.  Pt states she is was dong PT and a cube stepper thing.  [facility]   Pt states she had every tooth pulled recently and is waiting on dentures.   Pt is very well educated on Diabetes.   Due to pts late arrival remainder of appt will be tomorrow.   Handout given: trifold   Diabetes Self-Management Education  Visit Type: First/Initial   08/30/2022  Ms. Ariana Parks, identified by name and date of birth, is a 42 y.o. female with a diagnosis of Diabetes: Type 2.   ASSESSMENT  Height '5\' 9"'$  (1.753 m), weight (!) 337 lb (152.9 kg), currently breastfeeding. Body mass index is 49.77 kg/m.   Diabetes Self-Management Education - 08/30/22 1526       Visit Information   Visit Type First/Initial      Initial Visit   Diabetes Type Type 2    Are you currently following a meal plan? No    Are you taking your medications as  prescribed? Yes      Health Coping   How would you rate your overall health? Good      Psychosocial Assessment   Patient Belief/Attitude about Diabetes Motivated to manage diabetes    What is the hardest part about your diabetes right now, causing you the most concern, or is the most worrisome to you about your diabetes?   Making healty food and beverage choices    Self-care barriers None    Self-management support Friends;Family    Patient Concerns Nutrition/Meal planning;Weight Control    Special Needs None    Preferred Learning Style Auditory    How often do you need to have someone help you when you read instructions, pamphlets, or other written materials from your doctor or pharmacy? 1 - Never      Pre-Education Assessment   Patient understands the diabetes disease and treatment process. Needs Instruction    Patient understands incorporating nutritional management into lifestyle. Needs Instruction    Patient undertands incorporating physical activity into lifestyle. Needs Instruction    Patient understands using medications safely. Needs Instruction    Patient understands monitoring blood glucose, interpreting and using results Needs Instruction    Patient understands prevention, detection, and treatment of acute complications. Needs Instruction    Patient  understands prevention, detection, and treatment of chronic complications. Needs Instruction    Patient understands how to develop strategies to address psychosocial issues. Needs Instruction    Patient understands how to develop strategies to promote health/change behavior. Needs Instruction      Complications   Last HgB A1C per patient/outside source 8.1 %    How often do you check your blood sugar? 3-4 times/day    Fasting Blood glucose range (mg/dL) 70-129    Postprandial Blood glucose range (mg/dL) 70-129    Number of hypoglycemic episodes per month 0    Number of hyperglycemic episodes ( >'200mg'$ /dL): Never    Can you tell  when your blood sugar is high? Yes    What do you do if your blood sugar is high? drink water and walking    Have you had a dilated eye exam in the past 12 months? No    Have you had a dental exam in the past 12 months? Yes    Are you checking your feet? Yes    How many days per week are you checking your feet? 7      Dietary Intake   Breakfast atkins protein shake with grits or banana    Lunch (2-4) refrigerated soup    Dinner refridgerated soup or hotdog weener and chili and cole slaw    Snack (evening) peanut butter or carb smart ice cream    Beverage(s) stary zero, sugar free drinks, water, milk, coffee + sugar free creamer      Activity / Exercise   Activity / Exercise Type ADL's      Patient Education   Previous Diabetes Education Yes (please comment)      Post-Education Assessment   Patient understands the diabetes disease and treatment process. Demonstrates understanding / competency    Patient understands incorporating nutritional management into lifestyle. Needs Instruction    Patient undertands incorporating physical activity into lifestyle. Needs Instruction    Patient understands using medications safely. Demonstrates understanding / competency    Patient understands monitoring blood glucose, interpreting and using results Demonstrates understanding / competency    Patient understands prevention, detection, and treatment of acute complications. Demonstrates understanding / competency    Patient understands prevention, detection, and treatment of chronic complications. Needs Instruction    Patient understands how to develop strategies to address psychosocial issues. Needs Instruction    Patient understands how to develop strategies to promote health/change behavior. Needs Instruction      Outcomes   Expected Outcomes Demonstrated interest in learning. Expect positive outcomes    Future DMSE 2 wks    Program Status Not Completed             Individualized Plan for  Diabetes Self-Management Training:   Learning Objective:  Patient will have a greater understanding of diabetes self-management. Patient education plan is to attend individual and/or group sessions per assessed needs and concerns.    Expected Outcomes:  Demonstrated interest in learning. Expect positive outcomes  Education material provided: My Plate  If problems or questions, patient to contact team via:  Phone and Email  Future DSME appointment: 2 wks

## 2022-08-31 ENCOUNTER — Telehealth: Payer: Medicaid Other | Admitting: Skilled Nursing Facility1

## 2022-09-13 ENCOUNTER — Other Ambulatory Visit: Payer: Self-pay | Admitting: Family Medicine

## 2022-09-14 ENCOUNTER — Encounter: Payer: Self-pay | Admitting: Family Medicine

## 2022-09-14 ENCOUNTER — Ambulatory Visit: Payer: Medicaid Other | Admitting: Family Medicine

## 2022-09-14 VITALS — BP 120/68 | HR 77 | Ht 69.0 in | Wt 338.0 lb

## 2022-09-14 DIAGNOSIS — E119 Type 2 diabetes mellitus without complications: Secondary | ICD-10-CM | POA: Diagnosis not present

## 2022-09-14 DIAGNOSIS — G8929 Other chronic pain: Secondary | ICD-10-CM

## 2022-09-14 DIAGNOSIS — E1165 Type 2 diabetes mellitus with hyperglycemia: Secondary | ICD-10-CM | POA: Diagnosis not present

## 2022-09-14 DIAGNOSIS — M545 Low back pain, unspecified: Secondary | ICD-10-CM

## 2022-09-14 DIAGNOSIS — M5416 Radiculopathy, lumbar region: Secondary | ICD-10-CM | POA: Diagnosis not present

## 2022-09-14 DIAGNOSIS — F331 Major depressive disorder, recurrent, moderate: Secondary | ICD-10-CM

## 2022-09-14 LAB — POCT GLYCOSYLATED HEMOGLOBIN (HGB A1C): Hemoglobin A1C: 6.3 % — AB (ref 4.0–5.6)

## 2022-09-14 LAB — POCT UA - MICROALBUMIN
Creatinine, POC: 50 mg/dL
Microalbumin Ur, POC: 10 mg/L

## 2022-09-14 NOTE — Progress Notes (Unsigned)
Ariana Parks - 42 y.o. female MRN [id number]  Date of birth: 17-Jan-1981  Subjective Chief Complaint  Patient presents with   Diabetes    HPI Ariana Parks is a 42 y.o. female here today for follow up visit.    She had knee arthroscopy in December.  She does continue to have knee pain  She is seeing a dietician to help with management of her blood sugars.  She remains on metformin and is using freestyle libre to monitor blood sugars.  A1c improved to 6.3%.  Mood is up and down.  Feels that current strength of sertraline is working pretty well for her.   Continues to use lidoderm patches on the lower back.  Requesting renewal of robaxin as well. Unable to get TENS unit.   ROS:  A comprehensive ROS was completed and negative except as noted per HPI  Allergies  Allergen Reactions   Citalopram     Other reaction(s): Bradycardia   Celexa [Citalopram Hydrobromide] Other (See Comments)    Hospitalized due to low heart rate   Ibuprofen Other (See Comments)    High doses (petechial bruising)   Prednisone Other (See Comments)    Shaking uncontrollably    Prilosec [Omeprazole] Other (See Comments)    Hospitalized due to low heart rate   Wellbutrin [Bupropion] Other (See Comments)    Lows seizure threshold    Flexeril [Cyclobenzaprine] Other (See Comments)    Bizarre dreams/exacerbate depression    Past Medical History:  Diagnosis Date   Abnormal Pap smear of cervix 2005   Mod dysplasia   Depression    Endometriosis    GERD (gastroesophageal reflux disease)    Gestational diabetes mellitus (GDM) affecting first pregnancy    High cholesterol    Migraine headache    Nonocclusive mesenteric ischemia ([place]) 04/04/2018   Dx colonscopy Aug 2019 [facility]   OSA (obstructive sleep apnea) 07/05/2016   Panic attack    PCOS (polycystic ovarian syndrome)    Pre-diabetes     Past Surgical History:  Procedure Laterality Date   CESAREAN SECTION N/A 07/24/2020   Procedure: PRIMARY  CESAREAN SECTION;  Surgeon: Everlene Farrier, MD;  Location: [facility];  Service: Obstetrics;  Laterality: N/A;   KNEE ARTHROSCOPY WITH LATERAL MENISECTOMY Right 06/20/2022   Procedure: KNEE ARTHROSCOPY WITH PARTIAL  LATERAL MENISECTOMY AND CHONDROPLASTY;  Surgeon: Sydnee Cabal, MD;  Location: [facility];  Service: Orthopedics;  Laterality: Right;   KNEE SURGERY     LEEP     TONSILLECTOMY     WISDOM TOOTH EXTRACTION      Social History   Socioeconomic History   Marital status: Single    Spouse name: Not on file   Number of children: Not on file   Years of education: Not on file   Highest education level: Not on file  Occupational History   Occupation: Designer, jewellery  Tobacco Use   Smoking status: Former    Packs/day: 0.50    Years: 15.00    Additional pack years: 0.00    Total pack years: 7.50    Types: Cigarettes    Quit date: 05/25/2020    Years since quitting: 2.3   Smokeless tobacco: Never   Tobacco comments:    using vapes  Vaping Use   Vaping Use: Never used  Substance and Sexual Activity   Alcohol use: Not Currently    Alcohol/week: 0.0 standard drinks of alcohol   Drug use: No   Sexual activity: Yes  Partners: Male    Birth control/protection: None  Other Topics Concern   Not on file  Social History Narrative   Not on file   Social Determinants of Health   Financial Resource Strain: Not on file  Food Insecurity: No Food Insecurity (05/23/2020)   Hunger Vital Sign    Worried About Running Out of Food in the Last Year: Never true    Ran Out of Food in the Last Year: Never true  Transportation Needs: Not on file  Physical Activity: Not on file  Stress: Not on file  Social Connections: Not on file    Family History  Problem Relation Age of Onset   Hypertension Mother    Thyroid disease Mother    GER disease Father    Migraines Father    Heart attack Other    Thyroid disease Brother     Health Maintenance  Topic Date Due   INFLUENZA VACCINE   09/23/2022 (Originally 01/23/2022)   PAP SMEAR-Modifier  09/26/2022 (Originally 08/09/2007)   OPHTHALMOLOGY EXAM  09/14/2023 (Originally 08/21/1991)   COVID-19 Vaccine (2 - 2023-24 season) 09/30/2023 (Originally 02/23/2022)   COLONOSCOPY (Pts 45-15yrs Insurance coverage will need to be confirmed)  02/07/2023   HEMOGLOBIN A1C  03/17/2023   Diabetic kidney evaluation - eGFR measurement  06/13/2023   FOOT EXAM  07/06/2023   Diabetic kidney evaluation - Urine ACR  09/14/2023   DTaP/Tdap/Td (3 - Td or Tdap) 05/03/2030   Hepatitis C Screening  Completed   HIV Screening  Completed   HPV VACCINES  Aged Out     ----------------------------------------------------------------------------------------------------------------------------------------------------------------------------------------------------------------- Physical Exam BP 120/68   Pulse 77   Ht 5\' 9"  (1.753 m)   Wt (!) 338 lb (153.3 kg)   SpO2 97%   BMI 49.91 kg/m   Physical Exam Constitutional:      Appearance: Normal appearance.  HENT:     Head: Normocephalic and atraumatic.  Eyes:     General: No scleral icterus. Cardiovascular:     Rate and Rhythm: Normal rate and regular rhythm.  Pulmonary:     Effort: Pulmonary effort is normal.     Breath sounds: Normal breath sounds.  Neurological:     General: No focal deficit present.     Mental Status: She is alert.  Psychiatric:        Mood and Affect: Mood normal.        Behavior: Behavior normal.     ------------------------------------------------------------------------------------------------------------------------------------------------------------------------------------------------------------------- Assessment and Plan  Type 2 diabetes mellitus with hyperglycemia ([place]) She has had significant improvement in her diabetes since last visit.  Encouraged her to continue to work with dietitian to make healthy lifestyle changes.  Continue metformin at current  strength.  MDD (major depressive disorder) Mood is up-and-down.  Overall stable.  Continue sertraline at current strength.  Chronic lumbar radiculopathy Chronic low back pain.  Tried and failed physical therapy, unable to tolerate.Marland Kitchen  MRI without significant disease.  She may continue Lidoderm patches with Robaxin as needed.  Or change previously.  DME company that she took this do not take her insurance.  I am prescribing Zynex NexWave device to help with management of her low back pain.   No orders of the defined types were placed in this encounter.   Return in about 4 months (around 01/14/2023) for F/u T2DM and Mood.    This visit occurred during the SARS-CoV-2 public health emergency.  Safety protocols were in place, including screening questions prior to the visit, additional usage of staff  PPE, and extensive cleaning of exam room while observing appropriate contact time as indicated for disinfecting solutions.

## 2022-09-16 ENCOUNTER — Encounter: Payer: Self-pay | Admitting: Family Medicine

## 2022-09-16 NOTE — Assessment & Plan Note (Signed)
Mood is up-and-down.  Overall stable.  Continue sertraline at current strength.

## 2022-09-16 NOTE — Assessment & Plan Note (Signed)
She has had significant improvement in her diabetes since last visit.  Encouraged her to continue to work with dietitian to make healthy lifestyle changes.  Continue metformin at current strength.

## 2022-09-16 NOTE — Assessment & Plan Note (Addendum)
Chronic low back pain.  Tried and failed physical therapy, unable to tolerate.Ariana Parks  MRI without significant disease.  She may continue Lidoderm patches with Robaxin as needed.  Or change previously.  DME company that she took this do not take her insurance.  I am prescribing Zynex NexWave device to help with management of her low back pain.

## 2022-09-26 DIAGNOSIS — Z6841 Body Mass Index (BMI) 40.0 and over, adult: Secondary | ICD-10-CM | POA: Diagnosis not present

## 2022-09-26 DIAGNOSIS — Z1231 Encounter for screening mammogram for malignant neoplasm of breast: Secondary | ICD-10-CM | POA: Diagnosis not present

## 2022-09-26 DIAGNOSIS — Z0000 Encounter for general adult medical examination without abnormal findings: Secondary | ICD-10-CM | POA: Diagnosis not present

## 2022-09-26 LAB — HM MAMMOGRAPHY

## 2022-09-27 LAB — HM PAP SMEAR: HM Pap smear: NEGATIVE

## 2022-09-28 DIAGNOSIS — M1711 Unilateral primary osteoarthritis, right knee: Secondary | ICD-10-CM | POA: Diagnosis not present

## 2022-09-28 DIAGNOSIS — Z4789 Encounter for other orthopedic aftercare: Secondary | ICD-10-CM | POA: Diagnosis not present

## 2022-10-01 ENCOUNTER — Encounter: Payer: Self-pay | Admitting: Family Medicine

## 2022-10-01 DIAGNOSIS — E119 Type 2 diabetes mellitus without complications: Secondary | ICD-10-CM

## 2022-10-01 MED ORDER — DEXCOM G6 RECEIVER DEVI: 0 refills | Status: DC

## 2022-10-01 MED ORDER — DEXCOM G6 SENSOR MISC: 11 refills | Status: DC

## 2022-10-10 ENCOUNTER — Other Ambulatory Visit: Payer: Self-pay | Admitting: Family Medicine

## 2022-10-10 ENCOUNTER — Encounter: Payer: Self-pay | Admitting: Family Medicine

## 2022-10-10 DIAGNOSIS — E119 Type 2 diabetes mellitus without complications: Secondary | ICD-10-CM

## 2022-10-12 ENCOUNTER — Other Ambulatory Visit: Payer: Self-pay | Admitting: Family Medicine

## 2022-10-12 ENCOUNTER — Telehealth: Payer: Self-pay

## 2022-10-12 NOTE — Telephone Encounter (Signed)
Initiated Prior authorization DGL:OVFIEP G6 Sensor,Dexcom G6 Receiver device  Via: Covermymeds Case/Key:[id number] Status: denied as of 10/11/22 Reason:[employer] reviewed your DEXCOM G6 SENSOR request for the above-identified member,  and it is denied for the following reason: because we did not see what we need to approve the  device you asked for, (Dexcom G6 sensor). We may be able to approve this device when we  see certain records (documentation that you have been using the continuous glucose  monitoring system as prescribed; documentation that you have had a face-to-face encounter  with your ordering practitioner no more than 3 months prior to submission of the  reauthorization request to evaluate the efficacy of the continuous glucose monitoring system).  We based this decision on your health plan's prior authorization clinical criteria named  Therapeutic Continuous Glucose Monitoring Systems (CGM) and Related Supplies.  Notified Pt via: Mychart  Submission from another user

## 2022-10-16 ENCOUNTER — Encounter: Payer: Self-pay | Admitting: Family Medicine

## 2022-10-16 DIAGNOSIS — E119 Type 2 diabetes mellitus without complications: Secondary | ICD-10-CM

## 2022-10-16 MED ORDER — ONETOUCH ULTRA MINI W/DEVICE KIT: PACK | 0 refills | Status: AC

## 2022-10-16 MED ORDER — ONETOUCH ULTRA TEST VI STRP: ORAL_STRIP | 12 refills | Status: AC

## 2022-11-06 ENCOUNTER — Other Ambulatory Visit: Payer: Self-pay | Admitting: Family Medicine

## 2022-11-15 ENCOUNTER — Encounter: Payer: Self-pay | Admitting: Family Medicine

## 2022-11-19 ENCOUNTER — Other Ambulatory Visit: Payer: Self-pay | Admitting: Family Medicine

## 2022-12-07 ENCOUNTER — Other Ambulatory Visit: Payer: Self-pay | Admitting: Family Medicine

## 2023-01-10 ENCOUNTER — Encounter: Payer: Self-pay | Admitting: Family Medicine

## 2023-01-10 MED ORDER — FREESTYLE LIBRE 2 SENSOR MISC: 1 refills | Status: DC

## 2023-01-14 ENCOUNTER — Ambulatory Visit: Payer: Medicaid Other | Admitting: Family Medicine

## 2023-01-16 ENCOUNTER — Ambulatory Visit: Payer: Medicaid Other | Admitting: Family Medicine

## 2023-01-16 ENCOUNTER — Encounter: Payer: Self-pay | Admitting: Family Medicine

## 2023-01-16 VITALS — BP 111/76 | HR 74 | Ht 69.0 in | Wt 327.0 lb

## 2023-01-16 DIAGNOSIS — M545 Low back pain, unspecified: Secondary | ICD-10-CM

## 2023-01-16 DIAGNOSIS — G8929 Other chronic pain: Secondary | ICD-10-CM

## 2023-01-16 DIAGNOSIS — F331 Major depressive disorder, recurrent, moderate: Secondary | ICD-10-CM | POA: Diagnosis not present

## 2023-01-16 DIAGNOSIS — E119 Type 2 diabetes mellitus without complications: Secondary | ICD-10-CM | POA: Diagnosis not present

## 2023-01-16 DIAGNOSIS — E1165 Type 2 diabetes mellitus with hyperglycemia: Secondary | ICD-10-CM | POA: Diagnosis not present

## 2023-01-16 LAB — POCT GLYCOSYLATED HEMOGLOBIN (HGB A1C): HbA1c, POC (controlled diabetic range): 6.1 % (ref 0.0–7.0)

## 2023-01-16 NOTE — Assessment & Plan Note (Signed)
Continue sertraline at current strength.

## 2023-01-16 NOTE — Assessment & Plan Note (Signed)
Blood sugars are better controlled.  She will continue metformin as well as dietary changes.  Encourage increase activity level.

## 2023-01-16 NOTE — Progress Notes (Signed)
Ariana Parks - 42 y.o. female MRN [id number]  Date of birth: 1980-11-28  Subjective Chief Complaint  Patient presents with   Diabetes    HPI Ariana Parks is a 42 year old female here today for follow-up visit.  She reports she is doing "okay".  Weight is down an additional 10 pounds since last visit.  She continues on metformin for management of her diabetes.  Her A1c is down to 6.1%.  She was using CGM to check glucose at home previously but insurance is not covering this at this point.  He did try to get a TENS unit for her chronic low back pain however DME company did not accept her insurance.  She has had trouble getting Lidoderm patch covered as well.  Remains on ibuprofen as needed.  She does have some numb and burning sensation in the bilateral feet.  She does continue to breast-feed her child.  Her mood remains stable with sertraline.    ROS:  A comprehensive ROS was completed and negative except as noted per HPI  Allergies  Allergen Reactions   Citalopram     Other reaction(s): Bradycardia   Celexa [Citalopram Hydrobromide] Other (See Comments)    Hospitalized due to low heart rate   Ibuprofen Other (See Comments)    High doses (petechial bruising)   Prednisone Other (See Comments)    Shaking uncontrollably    Prilosec [Omeprazole] Other (See Comments)    Hospitalized due to low heart rate   Wellbutrin [Bupropion] Other (See Comments)    Lows seizure threshold    Flexeril [Cyclobenzaprine] Other (See Comments)    Bizarre dreams/exacerbate depression    Past Medical History:  Diagnosis Date   Abnormal Pap smear of cervix 2005   Mod dysplasia   Depression    Endometriosis    GERD (gastroesophageal reflux disease)    Gestational diabetes mellitus (GDM) affecting first pregnancy    High cholesterol    Migraine headache    Nonocclusive mesenteric ischemia (HCC) 04/04/2018   Dx colonscopy Aug 2019 [facility]   OSA (obstructive sleep apnea) 07/05/2016   Panic  attack    PCOS (polycystic ovarian syndrome)    Pre-diabetes     Past Surgical History:  Procedure Laterality Date   CESAREAN SECTION N/A 07/24/2020   Procedure: PRIMARY CESAREAN SECTION;  Surgeon: Harold Hedge, MD;  Location: [facility];  Service: Obstetrics;  Laterality: N/A;   KNEE ARTHROSCOPY WITH LATERAL MENISECTOMY Right 06/20/2022   Procedure: KNEE ARTHROSCOPY WITH PARTIAL  LATERAL MENISECTOMY AND CHONDROPLASTY;  Surgeon: Eugenia Mcalpine, MD;  Location: [facility];  Service: Orthopedics;  Laterality: Right;   KNEE SURGERY     LEEP     TONSILLECTOMY     WISDOM TOOTH EXTRACTION      Social History   Socioeconomic History   Marital status: Single    Spouse name: Not on file   Number of children: Not on file   Years of education: Not on file   Highest education level: Some college, no degree  Occupational History   Occupation: Academic librarian  Tobacco Use   Smoking status: Former    Current packs/day: 0.00    Average packs/day: 0.5 packs/day for 15.0 years (7.5 ttl pk-yrs)    Types: Cigarettes    Start date: 05/25/2005    Quit date: 05/25/2020    Years since quitting: 2.6   Smokeless tobacco: Never   Tobacco comments:    using vapes  Vaping Use   Vaping status: Never Used  Substance and Sexual Activity   Alcohol use: Not Currently    Alcohol/week: 0.0 standard drinks of alcohol   Drug use: No   Sexual activity: Yes    Partners: Male    Birth control/protection: None  Other Topics Concern   Not on file  Social History Narrative   Not on file   Social Determinants of Health   Financial Resource Strain: Patient Declined (01/15/2023)   Overall Financial Resource Strain (CARDIA)    Difficulty of Paying Living Expenses: Patient declined  Food Insecurity: Patient Declined (01/15/2023)   Hunger Vital Sign    Worried About Running Out of Food in the Last Year: Patient declined    Ran Out of Food in the Last Year: Patient declined  Transportation Needs: No Transportation  Needs (01/15/2023)   PRAPARE - Administrator, Civil Service (Medical): No    Lack of Transportation (Non-Medical): No  Physical Activity: Insufficiently Active (01/15/2023)   Exercise Vital Sign    Days of Exercise per Week: 2 days    Minutes of Exercise per Session: 10 min  Stress: Stress Concern Present (01/15/2023)   [employer] of Occupational Health - Occupational Stress Questionnaire    Feeling of Stress : Rather much  Social Connections: Socially Isolated (01/15/2023)   Social Connection and Isolation Panel [NHANES]    Frequency of Communication with Friends and Family: Once a week    Frequency of Social Gatherings with Friends and Family: Once a week    Attends Religious Services: More than 4 times per year    Active Member of [employer] or Organizations: No    Attends Engineer, structural: Not on file    Marital Status: Never married    Family History  Problem Relation Age of Onset   Hypertension Mother    Thyroid disease Mother    GER disease Father    Migraines Father    Heart attack Other    Thyroid disease Brother     Health Maintenance  Topic Date Due   PAP SMEAR-Modifier  08/09/2007   Colonoscopy  02/07/2023   OPHTHALMOLOGY EXAM  09/14/2023 (Originally 08/21/1991)   COVID-19 Vaccine (2 - 2023-24 season) 09/30/2023 (Originally 02/23/2022)   INFLUENZA VACCINE  01/24/2023   Diabetic kidney evaluation - eGFR measurement  06/13/2023   FOOT EXAM  07/06/2023   HEMOGLOBIN A1C  07/19/2023   Diabetic kidney evaluation - Urine ACR  09/14/2023   DTaP/Tdap/Td (3 - Td or Tdap) 05/03/2030   Hepatitis C Screening  Completed   HIV Screening  Completed   HPV VACCINES  Aged Out     ----------------------------------------------------------------------------------------------------------------------------------------------------------------------------------------------------------------- Physical Exam BP 111/76 (BP Location: Left Arm, Patient Position:  Sitting, Cuff Size: Large)   Pulse 74   Ht 5\' 9"  (1.753 m)   Wt (!) 327 lb (148.3 kg)   SpO2 97%   Breastfeeding Yes   BMI 48.29 kg/m   Physical Exam Constitutional:      Appearance: Normal appearance.  HENT:     Head: Normocephalic and atraumatic.  Eyes:     General: No scleral icterus. Cardiovascular:     Rate and Rhythm: Normal rate and regular rhythm.  Pulmonary:     Effort: Pulmonary effort is normal.     Breath sounds: Normal breath sounds.  Musculoskeletal:     Cervical back: Neck supple.  Neurological:     Mental Status: She is alert.  Psychiatric:        Mood and Affect: Mood normal.  Behavior: Behavior normal.     ------------------------------------------------------------------------------------------------------------------------------------------------------------------------------------------------------------------- Assessment and Plan  Type 2 diabetes mellitus with hyperglycemia (HCC) Blood sugars are better controlled.  She will continue metformin as well as dietary changes.  Encourage increase activity level.  Chronic bilateral low back pain without sciatica She continues to have chronic low back pain.  I encouraged continued stretching and increased activity.  I have prescribed NexWave device to help with management of her pain.  Will also see if we can get Lidoderm patches approved for her.  MDD (major depressive disorder) Continue sertraline at current strength.   No orders of the defined types were placed in this encounter.   Return in about 6 months (around 07/19/2023) for T2DM.    This visit occurred during the SARS-CoV-2 public health emergency.  Safety protocols were in place, including screening questions prior to the visit, additional usage of staff PPE, and extensive cleaning of exam room while observing appropriate contact time as indicated for disinfecting solutions.

## 2023-01-16 NOTE — Assessment & Plan Note (Signed)
She continues to have chronic low back pain.  I encouraged continued stretching and increased activity.  I have prescribed NexWave device to help with management of her pain.  Will also see if we can get Lidoderm patches approved for her.

## 2023-01-16 NOTE — Progress Notes (Signed)
COC: requested pap smear from [facility]

## 2023-01-16 NOTE — Patient Instructions (Addendum)
Look at supplement with b-complex, alpha lipoic acid, magnesium for neuropathy.    We'll try to get Zynex device approved for you.   See me again in 6 months or sooner if needed.

## 2023-01-29 DIAGNOSIS — E119 Type 2 diabetes mellitus without complications: Secondary | ICD-10-CM | POA: Diagnosis not present

## 2023-02-01 DIAGNOSIS — E1165 Type 2 diabetes mellitus with hyperglycemia: Secondary | ICD-10-CM | POA: Diagnosis not present

## 2023-02-01 DIAGNOSIS — Z6841 Body Mass Index (BMI) 40.0 and over, adult: Secondary | ICD-10-CM | POA: Diagnosis not present

## 2023-02-01 DIAGNOSIS — G4733 Obstructive sleep apnea (adult) (pediatric): Secondary | ICD-10-CM | POA: Diagnosis not present

## 2023-02-01 DIAGNOSIS — R7303 Prediabetes: Secondary | ICD-10-CM | POA: Diagnosis not present

## 2023-02-12 ENCOUNTER — Encounter: Payer: Self-pay | Admitting: Family Medicine

## 2023-02-12 MED ORDER — NIRMATRELVIR/RITONAVIR (PAXLOVID)TABLET: 3.0000 | ORAL_TABLET | Freq: Two times a day (BID) | ORAL | 0 refills | Status: AC

## 2023-03-06 ENCOUNTER — Encounter: Payer: Self-pay | Admitting: Family Medicine

## 2023-03-11 DIAGNOSIS — G4733 Obstructive sleep apnea (adult) (pediatric): Secondary | ICD-10-CM | POA: Diagnosis not present

## 2023-03-14 ENCOUNTER — Encounter: Payer: Self-pay | Admitting: Family Medicine

## 2023-04-29 DIAGNOSIS — Z860101 Personal history of adenomatous and serrated colon polyps: Secondary | ICD-10-CM | POA: Diagnosis not present

## 2023-04-29 DIAGNOSIS — D124 Benign neoplasm of descending colon: Secondary | ICD-10-CM | POA: Diagnosis not present

## 2023-04-29 DIAGNOSIS — Z09 Encounter for follow-up examination after completed treatment for conditions other than malignant neoplasm: Secondary | ICD-10-CM | POA: Diagnosis not present

## 2023-04-29 DIAGNOSIS — K635 Polyp of colon: Secondary | ICD-10-CM | POA: Diagnosis not present

## 2023-05-13 ENCOUNTER — Telehealth: Payer: Medicaid Other | Admitting: Family Medicine

## 2023-05-13 DIAGNOSIS — J069 Acute upper respiratory infection, unspecified: Secondary | ICD-10-CM

## 2023-05-13 NOTE — Progress Notes (Signed)
E-Visit for Upper Respiratory Infection   We are sorry you are not feeling well.  Here is how we plan to help!  Since you are breastfeeding- it is better to use the safe over the counter options. If not improved in the next 2-3 days follow up for the possible need of an antibiotic.  Based on what you have shared with me, it looks like you may have a viral upper respiratory infection.  Upper respiratory infections are caused by a large number of viruses; however, rhinovirus is the most common cause.   Symptoms vary from person to person, with common symptoms including sore throat, cough, fatigue or lack of energy and feeling of general discomfort.  A low-grade fever of up to 100.4 may present, but is often uncommon.  Symptoms vary however, and are closely related to a person's age or underlying illnesses.  The most common symptoms associated with an upper respiratory infection are nasal discharge or congestion, cough, sneezing, headache and pressure in the ears and face.  These symptoms usually persist for about 3 to 10 days, but can last up to 2 weeks.  It is important to know that upper respiratory infections do not cause serious illness or complications in most cases.    Upper respiratory infections can be transmitted from person to person, with the most common method of transmission being a person's hands.  The virus is able to live on the skin and can infect other persons for up to 2 hours after direct contact.  Also, these can be transmitted when someone coughs or sneezes; thus, it is important to cover the mouth to reduce this risk.  To keep the spread of the illness at bay, good hand hygiene is very important.  This is an infection that is most likely caused by a virus. There are no specific treatments other than to help you with the symptoms until the infection runs its course.  We are sorry you are not feeling well.  Here is how we plan to help!  For nasal congestion, you may use an oral  decongestants such as Mucinex D or if you have glaucoma or high blood pressure use plain Mucinex.  Saline nasal spray or nasal drops can help and can safely be used as often as needed for congestion.   If you do not have a history of heart disease, hypertension, diabetes or thyroid disease, prostate/bladder issues or glaucoma, you may also use Sudafed to treat nasal congestion.  It is highly recommended that you consult with a pharmacist or your primary care physician to ensure this medication is safe for you to take.     If you have a cough, you may use cough suppressants such as Delsym and Robitussin.  If you have glaucoma or high blood pressure, you can also use Coricidin HBP.    If you have a sore or scratchy throat, use a saltwater gargle-  to  teaspoon of salt dissolved in a 4-ounce to 8-ounce glass of warm water.  Gargle the solution for approximately 15-30 seconds and then spit.  It is important not to swallow the solution.  You can also use throat lozenges/cough drops and Chloraseptic spray to help with throat pain or discomfort.  Warm or cold liquids can also be helpful in relieving throat pain.  For headache, pain or general discomfort, you can use Ibuprofen or Tylenol as directed.   Some authorities believe that zinc sprays or the use of Echinacea may shorten the course of  your symptoms.   HOME CARE Only take medications as instructed by your medical team. Be sure to drink plenty of fluids. Water is fine as well as fruit juices, sodas and electrolyte beverages. You may want to stay away from caffeine or alcohol. If you are nauseated, try taking small sips of liquids. How do you know if you are getting enough fluid? Your urine should be a pale yellow or almost colorless. Get rest. Taking a steamy shower or using a humidifier may help nasal congestion and ease sore throat pain. You can place a towel over your head and breathe in the steam from hot water coming from a faucet. Using a  saline nasal spray works much the same way. Cough drops, hard candies and sore throat lozenges may ease your cough. Avoid close contacts especially the very young and the elderly Cover your mouth if you cough or sneeze Always remember to wash your hands.   GET HELP RIGHT AWAY IF: You develop worsening fever. If your symptoms do not improve within 10 days You develop yellow or green discharge from your nose over 3 days. You have coughing fits You develop a severe head ache or visual changes. You develop shortness of breath, difficulty breathing or start having chest pain Your symptoms persist after you have completed your treatment plan  MAKE SURE YOU  Understand these instructions. Will watch your condition. Will get help right away if you are not doing well or get worse.  Thank you for choosing an e-visit.  Your e-visit answers were reviewed by a board certified advanced clinical practitioner to complete your personal care plan. Depending upon the condition, your plan could have included both over the counter or prescription medications.  Please review your pharmacy choice. Make sure the pharmacy is open so you can pick up prescription now. If there is a problem, you may contact your provider through [employer] and have the prescription routed to another pharmacy.  Your safety is important to [place]. If you have drug allergies check your prescription carefully.   For the next 24 hours you can use MyChart to ask questions about today's visit, request a non-urgent call back, or ask for a work or school excuse. You will get an email in the next two days asking about your experience. I hope that your e-visit has been valuable and will speed your recovery.    I provided 5 minutes of non face-to-face time during this encounter for chart review, medication and order placement, as well as and documentation.

## 2023-05-28 ENCOUNTER — Encounter: Payer: Self-pay | Admitting: Family Medicine

## 2023-05-28 MED ORDER — LORAZEPAM 1 MG PO TABS: 1.0000 mg | ORAL_TABLET | Freq: Three times a day (TID) | ORAL | 0 refills | Status: DC | PRN

## 2023-06-14 DIAGNOSIS — M79641 Pain in right hand: Secondary | ICD-10-CM | POA: Diagnosis not present

## 2023-06-14 DIAGNOSIS — S8391XA Sprain of unspecified site of right knee, initial encounter: Secondary | ICD-10-CM | POA: Diagnosis not present

## 2023-06-14 DIAGNOSIS — S60221A Contusion of right hand, initial encounter: Secondary | ICD-10-CM | POA: Diagnosis not present

## 2023-06-14 DIAGNOSIS — S8001XA Contusion of right knee, initial encounter: Secondary | ICD-10-CM | POA: Diagnosis not present

## 2023-06-14 DIAGNOSIS — Z87891 Personal history of nicotine dependence: Secondary | ICD-10-CM | POA: Diagnosis not present

## 2023-06-14 DIAGNOSIS — S5001XA Contusion of right elbow, initial encounter: Secondary | ICD-10-CM | POA: Diagnosis not present

## 2023-06-14 DIAGNOSIS — Z886 Allergy status to analgesic agent status: Secondary | ICD-10-CM | POA: Diagnosis not present

## 2023-06-14 DIAGNOSIS — Z888 Allergy status to other drugs, medicaments and biological substances status: Secondary | ICD-10-CM | POA: Diagnosis not present

## 2023-06-14 DIAGNOSIS — M25561 Pain in right knee: Secondary | ICD-10-CM | POA: Diagnosis not present

## 2023-06-29 ENCOUNTER — Other Ambulatory Visit: Payer: Self-pay | Admitting: Family Medicine

## 2023-07-02 ENCOUNTER — Encounter: Payer: Self-pay | Admitting: Family Medicine

## 2023-07-02 MED ORDER — METFORMIN HCL ER 500 MG PO TB24: 1000.0000 mg | ORAL_TABLET | Freq: Every day | ORAL | 0 refills | Status: DC

## 2023-07-04 ENCOUNTER — Encounter: Payer: Self-pay | Admitting: Podiatry

## 2023-07-05 ENCOUNTER — Ambulatory Visit: Payer: Medicaid Other | Admitting: Podiatry

## 2023-07-05 ENCOUNTER — Encounter: Payer: Self-pay | Admitting: Podiatry

## 2023-07-05 DIAGNOSIS — E119 Type 2 diabetes mellitus without complications: Secondary | ICD-10-CM

## 2023-07-05 DIAGNOSIS — G63 Polyneuropathy in diseases classified elsewhere: Secondary | ICD-10-CM

## 2023-07-05 DIAGNOSIS — E1165 Type 2 diabetes mellitus with hyperglycemia: Secondary | ICD-10-CM

## 2023-07-05 NOTE — Progress Notes (Signed)
  Subjective:  Patient ID: Ariana Parks, female    DOB: 17-May-1981,   MRN: [id number]  No chief complaint on file.   43 y.o. female presents for diabetic foot exam. Relates no changes with her feet. Does relate a recent fall and hurt her knee and hoping to find her previous knee doctor soon.   Denies any other pedal complaints. Denies n/v/f/c.   Past Medical History:  Diagnosis Date   Abnormal Pap smear of cervix 2005   Mod dysplasia   Depression    Endometriosis    GERD (gastroesophageal reflux disease)    Gestational diabetes mellitus (GDM) affecting first pregnancy    High cholesterol    Migraine headache    Nonocclusive mesenteric ischemia (HCC) 04/04/2018   Dx colonscopy Aug 2019 [facility]   OSA (obstructive sleep apnea) 07/05/2016   Panic attack    PCOS (polycystic ovarian syndrome)    Pre-diabetes     Objective:  Physical Exam: Vascular: DP/PT pulses 2/4 bilateral. CFT <3 seconds. Absent hair growth on digits. Edema noted to bilateral lower extremities. Xerosis noted bilaterally.  Skin. No lacerations or abrasions bilateral feet. Nails 1-5 bilateral are normal in appearance Musculoskeletal: MMT 5/5 bilateral lower extremities in DF, PF, Inversion and Eversion. Deceased ROM in DF of ankle joint.  Neurological: Sensation intact to light touch. Protective sensation diminished bilateral.    Assessment:   1. Polyneuropathy associated with underlying disease (HCC)   2. Type 2 diabetes mellitus with hyperglycemia, without long-term current use of insulin (HCC)   3. Encounter for diabetic foot exam (HCC)      Plan:  Patient was evaluated and treated and all questions answered. -Discussed and educated patient on diabetic foot care, especially with  regards to the vascular, neurological and musculoskeletal systems.  -Stressed the importance of good glycemic control and the detriment of not  controlling glucose levels in relation to the foot. -Discussed supportive shoes at  all times and checking feet regularly.  -Answered all patient questions -Patient to return  in 1 year for diabetic foot check.  -Patient advised to call the office if any problems or questions arise in the meantime.   Asberry Failing, DPM

## 2023-07-19 ENCOUNTER — Ambulatory Visit: Payer: Medicaid Other | Admitting: Family Medicine

## 2023-07-31 ENCOUNTER — Ambulatory Visit: Payer: Medicaid Other | Admitting: Family Medicine

## 2023-07-31 VITALS — BP 128/81 | HR 73 | Ht 69.0 in | Wt 343.0 lb

## 2023-07-31 DIAGNOSIS — Z23 Encounter for immunization: Secondary | ICD-10-CM

## 2023-07-31 DIAGNOSIS — Z7984 Long term (current) use of oral hypoglycemic drugs: Secondary | ICD-10-CM | POA: Diagnosis not present

## 2023-07-31 DIAGNOSIS — E1165 Type 2 diabetes mellitus with hyperglycemia: Secondary | ICD-10-CM | POA: Diagnosis not present

## 2023-07-31 DIAGNOSIS — F419 Anxiety disorder, unspecified: Secondary | ICD-10-CM | POA: Diagnosis not present

## 2023-07-31 LAB — POCT GLYCOSYLATED HEMOGLOBIN (HGB A1C): HbA1c, POC (controlled diabetic range): 6.5 % (ref 0.0–7.0)

## 2023-07-31 MED ORDER — METFORMIN HCL ER 500 MG PO TB24: 1000.0000 mg | ORAL_TABLET | Freq: Every day | ORAL | 1 refills | Status: DC

## 2023-07-31 NOTE — Assessment & Plan Note (Signed)
  Continue sertraline  at current strength.  She may continue lorazepam  with precautions to with breast-feeding within 12 to 18 hours after taking.

## 2023-07-31 NOTE — Progress Notes (Signed)
 Ariana Parks - 43 y.o. female MRN [id number]  Date of birth: April 12, 1981  Subjective Chief Complaint  Patient presents with   Weight Loss    HPI Ariana Parks is a 43 y.o. female here today for follow up visit.   She reports that she is doing okay..   She continues on metformin  for management of diabetes.  We discussed that she would likely benefit from GLP-1 howevere there have been limitations to medication she can utilize as she is still breastfeeding.  Her A1c remains fairly stable at 6.5%.  She has continued to have difficulty with weight loss.  Overall she is tolerating metformin  at current strength.  Mood is stable with sertraline  and lorazepam  as needed.  She does pump and dump if using a dose of lorazepam .  ROS:  A comprehensive ROS was completed and negative except as noted per HPI  Allergies  Allergen Reactions   Citalopram     Other reaction(s): Bradycardia   Celexa [Citalopram Hydrobromide] Other (See Comments)    Hospitalized due to low heart rate   Ibuprofen  Other (See Comments)    High doses (petechial bruising)   Prednisone  Other (See Comments)    Shaking uncontrollably    Prilosec [Omeprazole ] Other (See Comments)    Hospitalized due to low heart rate   Wellbutrin [Bupropion] Other (See Comments)    Lows seizure threshold    Flexeril [Cyclobenzaprine] Other (See Comments)    Bizarre dreams/exacerbate depression    Past Medical History:  Diagnosis Date   Abnormal Pap smear of cervix 2005   Mod dysplasia   Depression    Endometriosis    GERD (gastroesophageal reflux disease)    Gestational diabetes mellitus (GDM) affecting first pregnancy    High cholesterol    Migraine headache    Nonocclusive mesenteric ischemia (HCC) 04/04/2018   Dx colonscopy Aug 2019 [facility]   OSA (obstructive sleep apnea) 07/05/2016   Panic attack    PCOS (polycystic ovarian syndrome)    Pre-diabetes     Past Surgical History:  Procedure Laterality Date   CESAREAN  SECTION N/A 07/24/2020   Procedure: PRIMARY CESAREAN SECTION;  Surgeon: Curlene Agent, MD;  Location: [facility];  Service: Obstetrics;  Laterality: N/A;   KNEE ARTHROSCOPY WITH LATERAL MENISECTOMY Right 06/20/2022   Procedure: KNEE ARTHROSCOPY WITH PARTIAL  LATERAL MENISECTOMY AND CHONDROPLASTY;  Surgeon: Gerome Charleston, MD;  Location: [facility];  Service: Orthopedics;  Laterality: Right;   KNEE SURGERY     LEEP     TONSILLECTOMY     WISDOM TOOTH EXTRACTION      Social History   Socioeconomic History   Marital status: Single    Spouse name: Not on file   Number of children: Not on file   Years of education: Not on file   Highest education level: Some college, no degree  Occupational History   Occupation: academic librarian  Tobacco Use   Smoking status: Former    Current packs/day: 0.00    Average packs/day: 0.5 packs/day for 15.0 years (7.5 ttl pk-yrs)    Types: Cigarettes    Start date: 05/25/2005    Quit date: 05/25/2020    Years since quitting: 3.1   Smokeless tobacco: Never   Tobacco comments:    using vapes  Vaping Use   Vaping status: Never Used  Substance and Sexual Activity   Alcohol use: Not Currently    Alcohol/week: 0.0 standard drinks of alcohol   Drug use: No   Sexual activity: Yes  Partners: Male    Birth control/protection: None  Other Topics Concern   Not on file  Social History Narrative   Not on file   Social Drivers of Health   Financial Resource Strain: Patient Declined (07/31/2023)   Overall Financial Resource Strain (CARDIA)    Difficulty of Paying Living Expenses: Patient declined  Food Insecurity: Patient Declined (07/31/2023)   Hunger Vital Sign    Worried About Running Out of Food in the Last Year: Patient declined    Ran Out of Food in the Last Year: Patient declined  Transportation Needs: No Transportation Needs (07/31/2023)   PRAPARE - Administrator, Civil Service (Medical): No    Lack of Transportation (Non-Medical): No   Physical Activity: Insufficiently Active (07/31/2023)   Exercise Vital Sign    Days of Exercise per Week: 3 days    Minutes of Exercise per Session: 10 min  Stress: Stress Concern Present (07/31/2023)   [employer] of Occupational Health - Occupational Stress Questionnaire    Feeling of Stress : Rather much  Social Connections: Socially Isolated (07/31/2023)   Social Connection and Isolation Panel [NHANES]    Frequency of Communication with Friends and Family: Once a week    Frequency of Social Gatherings with Friends and Family: Never    Attends Religious Services: More than 4 times per year    Active Member of Clubs or Organizations: No    Attends Engineer, Structural: Not on file    Marital Status: Never married    Family History  Problem Relation Age of Onset   Hypertension Mother    Thyroid  disease Mother    GER disease Father    Migraines Father    Heart attack Other    Thyroid  disease Brother     Health Maintenance  Topic Date Due   OPHTHALMOLOGY EXAM  09/14/2023 (Originally 08/21/1991)   COVID-19 Vaccine (2 - 2024-25 season) 12/14/2023 (Originally 02/24/2023)   HEMOGLOBIN A1C  01/28/2024   Diabetic kidney evaluation - eGFR measurement  01/29/2024   Diabetic kidney evaluation - Urine ACR  01/29/2024   FOOT EXAM  07/04/2024   Cervical Cancer Screening (HPV/Pap Cotest)  09/26/2025   Colonoscopy  04/28/2028   DTaP/Tdap/Td (3 - Td or Tdap) 05/03/2030   Pneumococcal Vaccine 58-37 Years old  Completed   INFLUENZA VACCINE  Completed   Hepatitis C Screening  Completed   HIV Screening  Completed   HPV VACCINES  Aged Out     ----------------------------------------------------------------------------------------------------------------------------------------------------------------------------------------------------------------- Physical Exam BP 128/81 (BP Location: Left Arm, Patient Position: Sitting, Cuff Size: Large)   Pulse 73   Ht 5' 9 (1.753 m)   Wt  (!) 343 lb (155.6 kg)   SpO2 96%   BMI 50.65 kg/m   Physical Exam Constitutional:      Appearance: Normal appearance.  HENT:     Head: Normocephalic and atraumatic.  Cardiovascular:     Rate and Rhythm: Normal rate and regular rhythm.  Pulmonary:     Effort: Pulmonary effort is normal.     Breath sounds: Normal breath sounds.  Neurological:     Mental Status: She is alert.  Psychiatric:        Mood and Affect: Mood normal.        Behavior: Behavior normal.     ------------------------------------------------------------------------------------------------------------------------------------------------------------------------------------------------------------------- Assessment and Plan  Type 2 diabetes mellitus with hyperglycemia (HCC) Her diabetes remains fairly well-controlled.  We discussed consideration of weaning from breast-feeding to utilize GLP-1.  Continue to work  on dietary changes as well as incorporation of regular exercise.  Continue metformin  for now.  Anxiety  Continue sertraline  at current strength.  She may continue lorazepam  with precautions to with breast-feeding within 12 to 18 hours after taking.   Meds ordered this encounter  Medications   metFORMIN  (GLUCOPHAGE -XR) 500 MG 24 hr tablet    Sig: Take 2 tablets (1,000 mg total) by mouth daily with breakfast.    Dispense:  180 tablet    Refill:  1    Return in about 6 months (around 01/28/2024) for Type 2 Diabetes.    This visit occurred during the SARS-CoV-2 public health emergency.  Safety protocols were in place, including screening questions prior to the visit, additional usage of staff PPE, and extensive cleaning of exam room while observing appropriate contact time as indicated for disinfecting solutions.

## 2023-07-31 NOTE — Assessment & Plan Note (Signed)
Her diabetes remains fairly well-controlled.  We discussed consideration of weaning from breast-feeding to utilize GLP-1.  Continue to work on dietary changes as well as incorporation of regular exercise.  Continue metformin for now.

## 2023-08-05 DIAGNOSIS — M25561 Pain in right knee: Secondary | ICD-10-CM | POA: Diagnosis not present

## 2023-08-05 DIAGNOSIS — M179 Osteoarthritis of knee, unspecified: Secondary | ICD-10-CM | POA: Insufficient documentation

## 2023-08-05 DIAGNOSIS — M25562 Pain in left knee: Secondary | ICD-10-CM | POA: Diagnosis not present

## 2023-08-07 ENCOUNTER — Other Ambulatory Visit: Payer: Self-pay | Admitting: Family Medicine

## 2023-08-29 DIAGNOSIS — M25561 Pain in right knee: Secondary | ICD-10-CM | POA: Diagnosis not present

## 2023-08-30 ENCOUNTER — Other Ambulatory Visit: Payer: Self-pay | Admitting: Family Medicine

## 2023-09-12 DIAGNOSIS — S83281A Other tear of lateral meniscus, current injury, right knee, initial encounter: Secondary | ICD-10-CM | POA: Diagnosis not present

## 2023-09-12 DIAGNOSIS — M1711 Unilateral primary osteoarthritis, right knee: Secondary | ICD-10-CM | POA: Diagnosis not present

## 2023-10-02 DIAGNOSIS — Z1231 Encounter for screening mammogram for malignant neoplasm of breast: Secondary | ICD-10-CM | POA: Diagnosis not present

## 2023-10-02 DIAGNOSIS — Z0000 Encounter for general adult medical examination without abnormal findings: Secondary | ICD-10-CM | POA: Diagnosis not present

## 2023-10-02 DIAGNOSIS — N911 Secondary amenorrhea: Secondary | ICD-10-CM | POA: Diagnosis not present

## 2023-10-02 DIAGNOSIS — Z6841 Body Mass Index (BMI) 40.0 and over, adult: Secondary | ICD-10-CM | POA: Diagnosis not present

## 2023-10-12 DIAGNOSIS — G4733 Obstructive sleep apnea (adult) (pediatric): Secondary | ICD-10-CM | POA: Diagnosis not present

## 2023-10-25 DIAGNOSIS — S83281S Other tear of lateral meniscus, current injury, right knee, sequela: Secondary | ICD-10-CM | POA: Diagnosis not present

## 2023-10-25 DIAGNOSIS — M1711 Unilateral primary osteoarthritis, right knee: Secondary | ICD-10-CM | POA: Diagnosis not present

## 2023-12-19 ENCOUNTER — Ambulatory Visit: Payer: Self-pay

## 2023-12-19 ENCOUNTER — Telehealth: Payer: Self-pay

## 2023-12-19 ENCOUNTER — Ambulatory Visit: Payer: Self-pay | Admitting: *Deleted

## 2023-12-19 ENCOUNTER — Ambulatory Visit: Admitting: Family Medicine

## 2023-12-19 ENCOUNTER — Ambulatory Visit
Admission: EM | Admit: 2023-12-19 | Discharge: 2023-12-19 | Disposition: A | Discharge status: Home / Self Care | Attending: Family Medicine | Admitting: Family Medicine

## 2023-12-19 ENCOUNTER — Other Ambulatory Visit: Payer: Self-pay

## 2023-12-19 DIAGNOSIS — F430 Acute stress reaction: Secondary | ICD-10-CM

## 2023-12-19 DIAGNOSIS — R030 Elevated blood-pressure reading, without diagnosis of hypertension: Secondary | ICD-10-CM | POA: Diagnosis not present

## 2023-12-19 NOTE — Telephone Encounter (Signed)
 Opened chart to answer a question for the Patient Access Specialist.   Staff from [facility] trying to contact pt.   PAS will transfer call into the practice.

## 2023-12-19 NOTE — ED Provider Notes (Signed)
 [facility]    CSN: [id number] Arrival date & time: 12/19/23  1615      History   Chief Complaint Chief Complaint  Patient presents with   Hypertension    HPI Ariana Parks is a 43 y.o. female.   Pleasant 43 year old woman who is here for evaluation of elevated blood pressures.  She has had elevated blood pressures for the last couple of days.  She is here with her mother for evaluation.  She has been taking her blood pressure at home.  She had a blood pressure of this morning with a diastolic of 110.  Mother encouraged her to be seen because this is a dangerous blood pressure.  Patient did call her primary care office but was not satisfied with the appointment.  She is here for evaluation.  Denies any headache dizziness or nausea.  States she has nausea every morning when she wakes up but this is chronic for her.  She has never had hypertension.  Never been treated for hypertension.  Had no hypertension during her pregnancy.  Hypertension does run in her family. Patient does endorse significant emotional stress.  Stress at home.  Stress with her work.  Stress raising her child.  She states that she is trying to exercise.  She suffers from depression. When she had the elevated blood pressure this morning she took an Ativan , this calmed her down and her blood pressure did come back down    Past Medical History:  Diagnosis Date   Abnormal Pap smear of cervix 2005   Mod dysplasia   Depression    Endometriosis    GERD (gastroesophageal reflux disease)    Gestational diabetes mellitus (GDM) affecting first pregnancy    High cholesterol    Migraine headache    Nonocclusive mesenteric ischemia (HCC) 04/04/2018   Dx colonscopy Aug 2019 GAP   OSA (obstructive sleep apnea) 07/05/2016   Panic attack    PCOS (polycystic ovarian syndrome)    Pre-diabetes     Patient Active Problem List   Diagnosis Date Noted   Type 2 diabetes mellitus with hyperglycemia (HCC)  06/18/2022   Hyperhidrosis 02/20/2022   Chronic lumbar radiculopathy 11/20/2021   Elevated LFTs 11/20/2021   Abnormal weight gain 05/02/2021   Chronic bilateral low back pain without sciatica 05/02/2021   Lumbar paraspinal muscle spasm 12/08/2020   COVID-19 06/16/2020   GERD (gastroesophageal reflux disease) 03/24/2020   Nonocclusive mesenteric ischemia (HCC) 04/04/2018   DUB (dysfunctional uterine bleeding) 03/14/2017   OSA (obstructive sleep apnea) 07/05/2016   Morbid obesity (HCC) 07/05/2016   PCOS (polycystic ovarian syndrome) 05/31/2015   Abnormal EEG 01/02/2012   ADD (attention deficit disorder) 01/02/2012   Anxiety 01/02/2012   MDD (major depressive disorder) 01/02/2012    Past Surgical History:  Procedure Laterality Date   CESAREAN SECTION N/A 07/24/2020   Procedure: PRIMARY CESAREAN SECTION;  Surgeon: Curlene Agent, MD;  Location: [facility];  Service: Obstetrics;  Laterality: N/A;   KNEE ARTHROSCOPY WITH LATERAL MENISECTOMY Right 06/20/2022   Procedure: KNEE ARTHROSCOPY WITH PARTIAL  LATERAL MENISECTOMY AND CHONDROPLASTY;  Surgeon: Gerome Charleston, MD;  Location: [facility] ORS;  Service: Orthopedics;  Laterality: Right;   KNEE SURGERY     LEEP     TONSILLECTOMY     WISDOM TOOTH EXTRACTION      OB History     Gravida  1   Para  1   Term  1   Preterm  0   AB  0  Living  1      SAB  0   IAB  0   Ectopic  0   Multiple  0   Live Births  1            Home Medications    Prior to Admission medications   Medication Sig Start Date End Date Taking? Authorizing Provider  PROGESTERONE PO Take 200 mg by mouth every 3 (three) months.   Yes [provider]  acetaminophen  (TYLENOL ) 325 MG tablet Take 2 tablets (650 mg total) by mouth every 6 (six) hours as needed. Patient taking differently: Take 650 mg by mouth every 6 (six) hours as needed for mild pain (pain score 1-3). 07/27/20   Lequita Evalene LABOR, MD  Blood Glucose Monitoring Suppl (ONE TOUCH  ULTRA MINI) w/Device KIT Check glucose 3 times daily. 10/16/22   Alvia Bring, DO  famotidine  (PEPCID ) 20 MG tablet Take 20 mg by mouth at bedtime. OTC    [provider]  glucose blood (ONETOUCH ULTRA TEST) test strip Check glucose 3 times daily. 10/16/22   Alvia Bring, DO  ibuprofen  (ADVIL ) 200 MG tablet Take 400 mg by mouth every 6 (six) hours as needed for mild pain.    [provider]  LORazepam  (ATIVAN ) 1 MG tablet TAKE 1 TABLET BY MOUTH EVERY 8 HOURS AS NEEDED FOR ANXIETY 09/02/23   Willo Mini, NP  metFORMIN  (GLUCOPHAGE -XR) 500 MG 24 hr tablet Take 2 tablets (1,000 mg total) by mouth daily with breakfast. 07/31/23   Alvia Bring, DO  Nerve Stimulator (TENS WIRED PAIN MANAGEMENT) DEVI Use 1-2 times daily as needed for chronic back pain.  Please provide pads and batteries. 03/12/22   Alvia Bring, DO  Prenatal Vit-Fe Fumarate-FA (PRENATAL MULTIVITAMIN) TABS tablet Take 2 tablets by mouth daily at 12 noon.    [provider]  sertraline  (ZOLOFT ) 100 MG tablet TAKE 2 TABLETS BY MOUTH EVERY DAY 08/12/23   Alvia Bring, DO    Family History Family History  Problem Relation Age of Onset   Hypertension Mother    Thyroid  disease Mother    GER disease Father    Migraines Father    Heart attack Other    Thyroid  disease Brother     Social History Social History   Tobacco Use   Smoking status: Former    Current packs/day: 0.00    Average packs/day: 0.5 packs/day for 15.0 years (7.5 ttl pk-yrs)    Types: Cigarettes    Start date: 05/25/2005    Quit date: 05/25/2020    Years since quitting: 3.5   Smokeless tobacco: Never   Tobacco comments:    using vapes  Vaping Use   Vaping status: Never Used  Substance Use Topics   Alcohol use: Not Currently    Alcohol/week: 0.0 standard drinks of alcohol   Drug use: No     Allergies   Citalopram, Celexa [citalopram hydrobromide], Ibuprofen , Prednisone , Prilosec [omeprazole ], Wellbutrin [bupropion], and Flexeril  [cyclobenzaprine]   Review of Systems Review of Systems  See HPI Physical Exam Triage Vital Signs ED Triage Vitals  Encounter Vitals Group     BP 12/19/23 1623 (!) 141/87     Girls Systolic BP Percentile --      Girls Diastolic BP Percentile --      Boys Systolic BP Percentile --      Boys Diastolic BP Percentile --      Pulse Rate 12/19/23 1623 100     Resp 12/19/23 1623 20  Temp 12/19/23 1623 98.3 F (36.8 C)     Temp src --      SpO2 12/19/23 1623 95 %     Weight --      Height --      Head Circumference --      Peak Flow --      Pain Score 12/19/23 1631 0     Pain Loc --      Pain Education --      Exclude from Growth Chart --    No data found.  Updated Vital Signs BP (!) 141/87   Pulse 100   Temp 98.3 F (36.8 C)   Resp 20   LMP  (LMP Unknown)   SpO2 95%   Breastfeeding No      Physical Exam Constitutional:      General: She is not in acute distress.    Appearance: She is well-developed. She is obese.     Comments: Mildly labile.  Worried about health  HENT:     Head: Normocephalic and atraumatic.     Right Ear: Tympanic membrane normal.     Left Ear: Tympanic membrane normal.     Nose: Nose normal.   Eyes:     Conjunctiva/sclera: Conjunctivae normal.     Pupils: Pupils are equal, round, and reactive to light.    Cardiovascular:     Rate and Rhythm: Normal rate and regular rhythm.     Heart sounds: Normal heart sounds.  Pulmonary:     Effort: Pulmonary effort is normal. No respiratory distress.     Breath sounds: Normal breath sounds.  Abdominal:     General: There is no distension.     Palpations: Abdomen is soft.   Musculoskeletal:        General: Normal range of motion.     Cervical back: Normal range of motion.     Right lower leg: No edema.     Left lower leg: No edema.  Lymphadenopathy:     Cervical: No cervical adenopathy.   Skin:    General: Skin is warm and dry.   Neurological:     Mental Status: She is alert.       UC Treatments / Results  Labs (all labs ordered are listed, but only abnormal results are displayed) Labs Reviewed - No data to display  EKG   Radiology No results found.  Procedures Procedures (including critical care time)  Medications Ordered in UC Medications - No data to display  Initial Impression / Assessment and Plan / UC Course  I have reviewed the triage vital signs and the nursing notes.  Pertinent labs & imaging results that were available during my care of the patient were reviewed by me and considered in my medical decision making (see chart for details).     Discussed that a couple elevated blood pressures when somebody is under emotional stress does not equate to hypertension has the disease.  She needs to keep track of her blood pressures.  She needs to make efforts to try to reduce her stress.  She needs to limit her salt.  Continue exercise at a moderate pace.  Follow-up with her PCP.  She was given a log to write down her blood pressures Final Clinical Impressions(s) / UC Diagnoses   Final diagnoses:  Elevated blood pressure reading in office without diagnosis of hypertension  Acute reaction to situational stress     Discharge Instructions      Keep track of  your blood pressure Take it to your doctor appointment   ED Prescriptions   None    PDMP not reviewed this encounter.   Maranda Jamee Jacob, MD 12/19/23 1726

## 2023-12-19 NOTE — Telephone Encounter (Signed)
 Attempted call to patient. She is scheduled for  July 1 for elevated BP - called to go over symptoms to see if appropriate to wait until July 1 for appt.  Left a voice mail message requesting a return call.

## 2023-12-19 NOTE — Discharge Instructions (Signed)
 Keep track of your blood pressure Take it to your doctor appointment

## 2023-12-19 NOTE — ED Triage Notes (Signed)
 Bp this morning 159/110, took ativan  to calm down. Diastolic elevated yesterday after exercising. No pain.

## 2023-12-19 NOTE — Telephone Encounter (Signed)
 Copied from CRM [id number]. Topic: Clinical - Red Word Triage >> Dec 19, 2023  9:43 AM Merlynn LABOR wrote: Red Word that prompted transfer to Nurse Triage: High BP 143/93    FYI Only or Action Required?: FYI only for provider.  Patient was last seen in primary care on 07/31/2023 by Alvia Bring, DO. Called Nurse Triage reporting Hypertension. Symptoms began yesterday. Interventions attempted: Rest, hydration, or home remedies. Symptoms are: unchanged.  Triage Disposition: See PCP Within 2 Weeks  Patient/caregiver understands and will follow disposition?: Yes    Reason for Disposition  [1] Systolic BP  >= 130 OR Diastolic >= 80 AND [2] not taking BP medications  Answer Assessment - Initial Assessment Questions I advised that there are no other appointments earlier than 7/1 and that if the patient is not having any symptoms she should be okay to wait until that time. I advised that if the patient develops symptoms such as headache, dizziness, shortness of breath, chest pain, etc. that she should be taken to the ED for evaluation.      1. BLOOD PRESSURE: What is the blood pressure? Did you take at least two measurements 5 minutes apart?     154/102, 143/93 2. ONSET: When did you take your blood pressure?     Yesterday after exercising 3. HOW: How did you take your blood pressure? (e.g., automatic home BP monitor, visiting nurse)     Checked at school yesterday  4. HISTORY: Do you have a history of high blood pressure?     No 5. MEDICINES: Are you taking any medicines for blood pressure? Have you missed any doses recently?     No 6. OTHER SYMPTOMS: Do you have any symptoms? (e.g., blurred vision, chest pain, difficulty breathing, headache, weakness)     Patient's mother states the patient has not complained about any symptoms.  Protocols used: Blood Pressure - High-A-AH

## 2023-12-19 NOTE — Telephone Encounter (Signed)
 Spoke with patient .  States she goes to fitness class at school  on Monday and Wednesday  States BP  yesterday before her work out was 130/80's After workout was 140/102 After 25 minute ride home  was 140/90 This morning BP was 153/110 Patient states he does have occasional nausea  and headaches that she feels is related to her pillows and how she is sleeping  Patient scheduled today with Darice Booker PIETY  Dr. Alvia informed.

## 2023-12-20 ENCOUNTER — Telehealth: Payer: Self-pay

## 2023-12-20 NOTE — Telephone Encounter (Signed)
 Copied from CRM [id number]. Topic: General - Running Late >> Dec 19, 2023  3:57 PM Willma R wrote: Patient/patient representative is calling because they are running late for an appointment. Will be there in 5 minutes.

## 2023-12-24 ENCOUNTER — Ambulatory Visit: Admitting: Family Medicine

## 2023-12-25 DIAGNOSIS — M170 Bilateral primary osteoarthritis of knee: Secondary | ICD-10-CM | POA: Diagnosis not present

## 2023-12-30 ENCOUNTER — Other Ambulatory Visit: Payer: Self-pay | Admitting: Medical-Surgical

## 2023-12-30 NOTE — Telephone Encounter (Signed)
 Last filled 09/02/2023 by Zada Palin  Last OV 07/31/2023  Upcoming Appointment 01/28/2024

## 2024-01-28 ENCOUNTER — Ambulatory Visit: Payer: Medicaid Other | Admitting: Family Medicine

## 2024-02-03 ENCOUNTER — Encounter: Payer: Self-pay | Admitting: Family Medicine

## 2024-02-03 ENCOUNTER — Telehealth: Payer: Self-pay

## 2024-02-03 NOTE — Telephone Encounter (Signed)
 Error no encounter needed.

## 2024-02-03 NOTE — Telephone Encounter (Signed)
 Forwarding message to Zada Palin, NP covering DR. Alvia Sermon with patient. Scheduled her with DR. Thekkekandam for 02/07/24 @ 2:15pm She has been getting BP readings recently- yesterday was 168/102 today at time of call was 142/95-  With chest pain that she states feels like related to gas,  sleepiness, dizziness, fatigue  X several months. Coming and going- went to [facility] in June and was told that this waws stress related.  She does not agree.  She states she does take Ativan  1/2 to 1  full tablet - and BP will usually reduce- she does not currently take any medication for BP  .  O.k. to wait to see Dr. Curtis on Friday or should I move her appt up with another provider?

## 2024-02-04 NOTE — Telephone Encounter (Signed)
 Patient informed that per Zada Palin, NP - o.k. to wait until appt on Friday 02/07/24 with Dr. Curtis.

## 2024-02-04 NOTE — Telephone Encounter (Signed)
 Attempted call to patient. Left a voice mail message requesting a return call.

## 2024-02-04 NOTE — Telephone Encounter (Signed)
 Patient returning call, transferred to clinic

## 2024-02-05 ENCOUNTER — Other Ambulatory Visit: Payer: Self-pay | Admitting: Family Medicine

## 2024-02-07 ENCOUNTER — Ambulatory Visit: Admitting: Sports Medicine

## 2024-02-07 ENCOUNTER — Encounter: Payer: Self-pay | Admitting: Sports Medicine

## 2024-02-07 VITALS — BP 117/67 | HR 77 | Resp 20 | Ht 69.0 in | Wt 350.0 lb

## 2024-02-07 DIAGNOSIS — F419 Anxiety disorder, unspecified: Secondary | ICD-10-CM | POA: Diagnosis not present

## 2024-02-07 MED ORDER — METFORMIN HCL ER 500 MG PO TB24: 1000.0000 mg | ORAL_TABLET | Freq: Every day | ORAL | 1 refills | Status: DC

## 2024-02-07 MED ORDER — SERTRALINE HCL 50 MG PO TABS
ORAL_TABLET | ORAL | 0 refills | Status: DC
Start: 2024-02-07 — End: 2024-04-06

## 2024-02-07 MED ORDER — PAROXETINE HCL 20 MG PO TABS: ORAL_TABLET | ORAL | 3 refills | Status: DC

## 2024-02-07 NOTE — Progress Notes (Signed)
    Procedures performed today:    None.  Independent interpretation of notes and tests performed by another provider:   None.  Brief History, Exam, Impression, and Recommendations:    Anxiety Very pleasant 43 year old female, she is in here with some history of elevated blood pressures at home. She does sometimes get some chest pain but this may be related more to anxiety, she has no chest pain with walking up flights of stairs. Blood pressure today is normal. She notes that when it is elevated she is typically quite anxious. She will do ambulatory blood pressure monitoring and let me know what her blood pressures look like twice a day for about 2 weeks, she understands to sit quietly for 10 to 15 minutes before checking the blood pressure. I will also cross taper her down off of Zoloft  which she has been on for 5 years, we will update her on Paxil  and start behavioral therapy as well.    ____________________________________________ Debby PARAS. Curtis, M.D., ABFM., CAQSM., AME. Primary Care and Sports Medicine [facility]  Adjunct Professor of [facility] Medicine  [employer]  Restaurant manager, fast food

## 2024-02-07 NOTE — Assessment & Plan Note (Signed)
 Very pleasant 43 year old female, she is in here with some history of elevated blood pressures at home. She does sometimes get some chest pain but this may be related more to anxiety, she has no chest pain with walking up flights of stairs. Blood pressure today is normal. She notes that when it is elevated she is typically quite anxious. She will do ambulatory blood pressure monitoring and let me know what her blood pressures look like twice a day for about 2 weeks, she understands to sit quietly for 10 to 15 minutes before checking the blood pressure. I will also cross taper her down off of Zoloft  which she has been on for 5 years, we will update her on Paxil  and start behavioral therapy as well.

## 2024-02-13 ENCOUNTER — Other Ambulatory Visit: Payer: Self-pay | Admitting: Family Medicine

## 2024-02-25 ENCOUNTER — Encounter: Payer: Self-pay | Admitting: Sports Medicine

## 2024-03-03 ENCOUNTER — Other Ambulatory Visit (HOSPITAL_BASED_OUTPATIENT_CLINIC_OR_DEPARTMENT_OTHER): Payer: Self-pay

## 2024-03-03 DIAGNOSIS — F419 Anxiety disorder, unspecified: Secondary | ICD-10-CM

## 2024-03-05 ENCOUNTER — Ambulatory Visit: Admitting: Family Medicine

## 2024-03-05 VITALS — BP 123/80 | HR 91 | Temp 98.1°F | Resp 20 | Ht 69.0 in | Wt 350.0 lb

## 2024-03-05 DIAGNOSIS — Z7984 Long term (current) use of oral hypoglycemic drugs: Secondary | ICD-10-CM

## 2024-03-05 DIAGNOSIS — E1165 Type 2 diabetes mellitus with hyperglycemia: Secondary | ICD-10-CM

## 2024-03-05 DIAGNOSIS — F419 Anxiety disorder, unspecified: Secondary | ICD-10-CM | POA: Diagnosis not present

## 2024-03-05 DIAGNOSIS — R030 Elevated blood-pressure reading, without diagnosis of hypertension: Secondary | ICD-10-CM

## 2024-03-05 DIAGNOSIS — R7989 Other specified abnormal findings of blood chemistry: Secondary | ICD-10-CM | POA: Diagnosis not present

## 2024-03-05 LAB — POCT GLYCOSYLATED HEMOGLOBIN (HGB A1C): Hemoglobin A1C: 7.7 % — AB (ref 4.0–5.6)

## 2024-03-05 MED ORDER — LABETALOL HCL 100 MG PO TABS: 100.0000 mg | ORAL_TABLET | Freq: Every day | ORAL | 1 refills | Status: AC | PRN

## 2024-03-08 ENCOUNTER — Encounter: Payer: Self-pay | Admitting: Family Medicine

## 2024-03-08 DIAGNOSIS — R030 Elevated blood-pressure reading, without diagnosis of hypertension: Secondary | ICD-10-CM | POA: Insufficient documentation

## 2024-03-08 NOTE — Assessment & Plan Note (Signed)
 Elevation in blood blood pressure that occurs with increased anxiety.  Adding labetalol  as needed for elevated blood pressure readings and/or tachycardia at home.

## 2024-03-08 NOTE — Assessment & Plan Note (Signed)
 She is stable from sertraline  and is currently taking Paxil .  Tolerating pretty well but has not really noticed any big difference so far.  Only been on for a little over a week.  Will plan to continue this at current strength.

## 2024-03-08 NOTE — Progress Notes (Signed)
 Ariana Parks - 43 y.o. female MRN [id number]  Date of birth: [birth year]  Subjective Chief Complaint  Patient presents with   Follow-up    Type 2 diabetes mellitus without complication, without long-term current use of insulin (HCC) PHQ-GAD    HPI Ariana Parks is a 43 y.o. female here today for follow up visit.   She was seen by Dr. Curtis about 1 month ago.  She was having increased anxiety and fluctuations in blood pressure.  Adrien was recently restarted on Paxil .  Blood pressure at home has remained elevated at times.  She does get headaches from time to time.  Denies chest pain, shortness of breath, palpitations or vision changes. She is unsure if Paxil  is really made a big difference for her so far but she has only been on this for about a week.  Her A1c does remain elevated and has increased since last visit.  She remains on metformin .  Tolerating this pretty well.  Other options are limited due to her continued breast-feeding.  ROS:  A comprehensive ROS was completed and negative except as noted per HPI    Allergies  Allergen Reactions   Citalopram     Other reaction(s): Bradycardia   Celexa [Citalopram Hydrobromide] Other (See Comments)    Hospitalized due to low heart rate   Ibuprofen  Other (See Comments)    High doses (petechial bruising)   Prednisone  Other (See Comments)    Shaking uncontrollably    Prilosec [Omeprazole ] Other (See Comments)    Hospitalized due to low heart rate   Wellbutrin [Bupropion] Other (See Comments)    Lows seizure threshold    Flexeril [Cyclobenzaprine] Other (See Comments)    Bizarre dreams/exacerbate depression    Past Medical History:  Diagnosis Date   Abnormal Pap smear of cervix 2005   Mod dysplasia   Depression    Endometriosis    GERD (gastroesophageal reflux disease)    Gestational diabetes mellitus (GDM) affecting first pregnancy    High cholesterol    Migraine headache    Nonocclusive mesenteric ischemia  (HCC) 04/04/2018   Dx colonscopy Aug 2019 GAP   OSA (obstructive sleep apnea) 07/05/2016   Panic attack    PCOS (polycystic ovarian syndrome)    Pre-diabetes     Past Surgical History:  Procedure Laterality Date   CESAREAN SECTION N/A 07/24/2020   Procedure: PRIMARY CESAREAN SECTION;  Surgeon: Curlene Agent, MD;  Location: [facility];  Service: Obstetrics;  Laterality: N/A;   KNEE ARTHROSCOPY WITH LATERAL MENISECTOMY Right 06/20/2022   Procedure: KNEE ARTHROSCOPY WITH PARTIAL  LATERAL MENISECTOMY AND CHONDROPLASTY;  Surgeon: Gerome Charleston, MD;  Location: [facility];  Service: Orthopedics;  Laterality: Right;   KNEE SURGERY     LEEP     TONSILLECTOMY     WISDOM TOOTH EXTRACTION      Social History   Socioeconomic History   Marital status: Single    Spouse name: Not on file   Number of children: Not on file   Years of education: Not on file   Highest education level: Some college, no degree  Occupational History   Occupation: Academic librarian  Tobacco Use   Smoking status: Former    Current packs/day: 0.00    Average packs/day: 0.5 packs/day for 15.0 years (7.5 ttl pk-yrs)    Types: Cigarettes    Start date: 05/25/2005    Quit date: 05/25/2020    Years since quitting: 3.7   Smokeless tobacco: Never   Tobacco comments:  using vapes  Vaping Use   Vaping status: Never Used  Substance and Sexual Activity   Alcohol use: Not Currently    Alcohol/week: 0.0 standard drinks of alcohol   Drug use: No   Sexual activity: Yes    Partners: Male    Birth control/protection: None  Other Topics Concern   Not on file  Social History Narrative   Not on file   Social Drivers of Health   Financial Resource Strain: Patient Declined (03/05/2024)   Overall Financial Resource Strain (CARDIA)    Difficulty of Paying Living Expenses: Patient declined  Food Insecurity: Patient Declined (03/05/2024)   Hunger Vital Sign    Worried About Running Out of Food in the Last Year: Patient declined     Ran Out of Food in the Last Year: Patient declined  Transportation Needs: No Transportation Needs (03/05/2024)   PRAPARE - Administrator, Civil Service (Medical): No    Lack of Transportation (Non-Medical): No  Physical Activity: Unknown (03/05/2024)   Exercise Vital Sign    Days of Exercise per Week: Patient declined    Minutes of Exercise per Session: Not on file  Stress: Stress Concern Present (03/05/2024)   [employer] of Occupational Health - Occupational Stress Questionnaire    Feeling of Stress: Very much  Social Connections: Unknown (03/05/2024)   Social Connection and Isolation Panel    Frequency of Communication with Friends and Family: Patient declined    Frequency of Social Gatherings with Friends and Family: Patient declined    Attends Religious Services: More than 4 times per year    Active Member of Clubs or Organizations: Patient declined    Attends Engineer, structural: Not on file    Marital Status: Never married    Family History  Problem Relation Age of Onset   Hypertension Mother    Thyroid  disease Mother    GER disease Father    Migraines Father    Heart attack Other    Thyroid  disease Brother     Health Maintenance  Topic Date Due   OPHTHALMOLOGY EXAM  Never done   Hepatitis B Vaccines 19-59 Average Risk (1 of 3 - 19+ 3-dose series) Never done   HPV VACCINES (1 - 3-dose SCDM series) Never done   Influenza Vaccine  01/24/2024   Diabetic kidney evaluation - eGFR measurement  01/29/2024   Diabetic kidney evaluation - Urine ACR  01/29/2024   COVID-19 Vaccine (2 - 2025-26 season) 02/24/2024   FOOT EXAM  07/04/2024   HEMOGLOBIN A1C  09/02/2024   Mammogram  09/25/2024   Cervical Cancer Screening (HPV/Pap Cotest)  09/26/2025   Colonoscopy  04/28/2028   DTaP/Tdap/Td (3 - Td or Tdap) 05/03/2030   Pneumococcal Vaccine  Completed   Hepatitis C Screening  Completed   HIV Screening  Completed   Meningococcal B Vaccine  Aged Out      ----------------------------------------------------------------------------------------------------------------------------------------------------------------------------------------------------------------- Physical Exam BP 123/80 (BP Location: Left Arm, Patient Position: Sitting, Cuff Size: Large)   Pulse 91   Temp 98.1 F (36.7 C) (Oral)   Resp 20   Ht 5' 9 (1.753 m)   Wt (!) 350 lb (158.8 kg)   SpO2 96%   BMI 51.69 kg/m   Physical Exam Constitutional:      Appearance: Normal appearance.  Cardiovascular:     Rate and Rhythm: Normal rate and regular rhythm.  Pulmonary:     Effort: Pulmonary effort is normal.     Breath sounds: Normal breath  sounds.  Musculoskeletal:     Cervical back: Neck supple.  Neurological:     Mental Status: She is alert.  Psychiatric:        Mood and Affect: Mood normal.        Behavior: Behavior normal.     ------------------------------------------------------------------------------------------------------------------------------------------------------------------------------------------------------------------- Assessment and Plan  Anxiety She is stable from sertraline  and is currently taking Paxil .  Tolerating pretty well but has not really noticed any big difference so far.  Only been on for a little over a week.  Will plan to continue this at current strength.  Type 2 diabetes mellitus with hyperglycemia (HCC) Diabetes is poorly controlled.  Encouraged dietary changes.  Will plan to continue metformin  at current strength.  Ideally would like to utilize GLP-1 when she is stopped breast-feeding.  Elevated blood pressure reading Elevation in blood blood pressure that occurs with increased anxiety.  Adding labetalol  as needed for elevated blood pressure readings and/or tachycardia at home.   Meds ordered this encounter  Medications   labetalol  (NORMODYNE ) 100 MG tablet    Sig: Take 1 tablet (100 mg total) by mouth daily as  needed.    Dispense:  90 tablet    Refill:  1    Return in about 4 weeks (around 04/02/2024) for Hypertension, Mood/BH.

## 2024-03-08 NOTE — Assessment & Plan Note (Signed)
 Diabetes is poorly controlled.  Encouraged dietary changes.  Will plan to continue metformin  at current strength.  Ideally would like to utilize GLP-1 when she is stopped breast-feeding.

## 2024-04-01 ENCOUNTER — Encounter: Payer: Self-pay | Admitting: Family Medicine

## 2024-04-02 ENCOUNTER — Ambulatory Visit: Admitting: Family Medicine

## 2024-04-06 ENCOUNTER — Ambulatory Visit: Admitting: Family Medicine

## 2024-04-06 ENCOUNTER — Encounter: Payer: Self-pay | Admitting: Family Medicine

## 2024-04-06 VITALS — BP 124/81 | HR 69 | Ht 69.0 in | Wt 350.0 lb

## 2024-04-06 DIAGNOSIS — E1165 Type 2 diabetes mellitus with hyperglycemia: Secondary | ICD-10-CM

## 2024-04-06 DIAGNOSIS — Z7984 Long term (current) use of oral hypoglycemic drugs: Secondary | ICD-10-CM | POA: Diagnosis not present

## 2024-04-06 DIAGNOSIS — F331 Major depressive disorder, recurrent, moderate: Secondary | ICD-10-CM | POA: Diagnosis not present

## 2024-04-06 DIAGNOSIS — Z23 Encounter for immunization: Secondary | ICD-10-CM | POA: Diagnosis not present

## 2024-04-06 NOTE — Patient Instructions (Addendum)
 Wear good, supportive shoes.  Try b-complex with alpha lipoic acid.

## 2024-04-06 NOTE — Progress Notes (Signed)
 Ariana Parks - 43 y.o. female MRN [id number]  Date of birth: 1980-11-03  Subjective Chief Complaint  Patient presents with   Mood    HPI Ariana Parks is 43 y.o. female here today for follow up visit.   She was started on paxil  previously and this was recently increased to 40mg  as she felt that the 20mg  strength was not working well for her.  She is tolerating paxil  well at current strength.  She has only been on this for about 4 to 5 days.  She does feel decreased motivation along with anhedonia.  Her BP has been elevated in relation to her anxiety.   Labetalol  was added which does seem to help.  She was contacted to set up counseling but has not returned this call yet.  ROS:  A comprehensive ROS was completed and negative except as noted per HPI  Allergies  Allergen Reactions   Citalopram     Other reaction(s): Bradycardia   Celexa [Citalopram Hydrobromide] Other (See Comments)    Hospitalized due to low heart rate   Ibuprofen  Other (See Comments)    High doses (petechial bruising)   Prednisone  Other (See Comments)    Shaking uncontrollably    Prilosec [place] ] Other (See Comments)    Hospitalized due to low heart rate   Wellbutrin [Bupropion] Other (See Comments)    Lows seizure threshold    Flexeril [Cyclobenzaprine] Other (See Comments)    Bizarre dreams/exacerbate depression    Past Medical History:  Diagnosis Date   Abnormal Pap smear of cervix 2005   Mod dysplasia   Depression    Endometriosis    GERD (gastroesophageal reflux disease)    Gestational diabetes mellitus (GDM) affecting first pregnancy    High cholesterol    Migraine headache    Nonocclusive mesenteric ischemia 04/04/2018   Dx colonscopy Aug 2019 GAP   OSA (obstructive sleep apnea) 07/05/2016   Panic attack    PCOS (polycystic ovarian syndrome)    Pre-diabetes     Past Surgical History:  Procedure Laterality Date   CESAREAN SECTION N/A 07/24/2020   Procedure: PRIMARY CESAREAN  SECTION;  Surgeon: Curlene Agent, MD;  Location: [facility];  Service: Obstetrics;  Laterality: N/A;   KNEE ARTHROSCOPY WITH LATERAL MENISECTOMY Right 06/20/2022   Procedure: KNEE ARTHROSCOPY WITH PARTIAL  LATERAL MENISECTOMY AND CHONDROPLASTY;  Surgeon: Gerome Charleston, MD;  Location: [facility];  Service: Orthopedics;  Laterality: Right;   KNEE SURGERY     LEEP     TONSILLECTOMY     WISDOM TOOTH EXTRACTION      Social History   Socioeconomic History   Marital status: Single    Spouse name: Not on file   Number of children: Not on file   Years of education: Not on file   Highest education level: Some college, no degree  Occupational History   Occupation: Academic librarian  Tobacco Use   Smoking status: Former    Current packs/day: 0.00    Average packs/day: 0.5 packs/day for 15.0 years (7.5 ttl pk-yrs)    Types: Cigarettes    Start date: 05/25/2005    Quit date: 05/25/2020    Years since quitting: 3.8   Smokeless tobacco: Never   Tobacco comments:    using vapes  Vaping Use   Vaping status: Never Used  Substance and Sexual Activity   Alcohol use: Not Currently    Alcohol/week: 0.0 standard drinks of alcohol   Drug use: No   Sexual activity: Yes  Partners: Male    Birth control/protection: None  Other Topics Concern   Not on file  Social History Narrative   Not on file   Social Drivers of Health   Financial Resource Strain: Patient Declined (03/05/2024)   Overall Financial Resource Strain (CARDIA)    Difficulty of Paying Living Expenses: Patient declined  Food Insecurity: Patient Declined (03/05/2024)   Hunger Vital Sign    Worried About Running Out of Food in the Last Year: Patient declined    Ran Out of Food in the Last Year: Patient declined  Transportation Needs: No Transportation Needs (03/05/2024)   PRAPARE - Administrator, Civil Service (Medical): No    Lack of Transportation (Non-Medical): No  Physical Activity: Unknown (03/05/2024)   Exercise Vital  Sign    Days of Exercise per Week: Patient declined    Minutes of Exercise per Session: Not on file  Stress: Stress Concern Present (03/05/2024)   [employer] of Occupational Health - Occupational Stress Questionnaire    Feeling of Stress: Very much  Social Connections: Unknown (03/05/2024)   Social Connection and Isolation Panel    Frequency of Communication with Friends and Family: Patient declined    Frequency of Social Gatherings with Friends and Family: Patient declined    Attends Religious Services: More than 4 times per year    Active Member of Clubs or Organizations: Patient declined    Attends Engineer, structural: Not on file    Marital Status: Never married    Family History  Problem Relation Age of Onset   Hypertension Mother    Thyroid  disease Mother    GER disease Father    Migraines Father    Heart attack Other    Thyroid  disease Brother     Health Maintenance  Topic Date Due   OPHTHALMOLOGY EXAM  Never done   Hepatitis B Vaccines 19-59 Average Risk (1 of 3 - 19+ 3-dose series) Never done   HPV VACCINES (1 - 3-dose SCDM series) Never done   Diabetic kidney evaluation - eGFR measurement  01/29/2024   Diabetic kidney evaluation - Urine ACR  01/29/2024   COVID-19 Vaccine (2 - 2025-26 season) 02/24/2024   FOOT EXAM  07/04/2024   HEMOGLOBIN A1C  09/02/2024   Mammogram  09/25/2024   Cervical Cancer Screening (HPV/Pap Cotest)  09/26/2025   Colonoscopy  04/28/2028   DTaP/Tdap/Td (3 - Td or Tdap) 05/03/2030   Pneumococcal Vaccine  Completed   Influenza Vaccine  Completed   Hepatitis C Screening  Completed   HIV Screening  Completed   Meningococcal B Vaccine  Aged Out     ----------------------------------------------------------------------------------------------------------------------------------------------------------------------------------------------------------------- Physical Exam BP 124/81 (BP Location: Left Arm, Patient Position:  Sitting, Cuff Size: Large)   Pulse 69   Ht 5' 9 (1.753 m)   Wt (!) 350 lb (158.8 kg)   SpO2 95%   BMI 51.69 kg/m   Physical Exam Constitutional:      Appearance: She is obese.  HENT:     Head: Normocephalic and atraumatic.  Cardiovascular:     Rate and Rhythm: Normal rate and regular rhythm.  Skin:    General: Skin is warm and dry.  Neurological:     General: No focal deficit present.  Psychiatric:        Mood and Affect: Mood normal.        Behavior: Behavior normal.     ------------------------------------------------------------------------------------------------------------------------------------------------------------------------------------------------------------------- Assessment and Plan  MDD (major depressive disorder) Currently on Paxil .  Recent  increase to 40 mg.  Will plan to continue this at this strength for now.  Additional options are somewhat limited due to breast-feeding still.  She will contact [facility] to set up an appointment for counseling as well.   No orders of the defined types were placed in this encounter.   Return in about 8 weeks (around 06/01/2024) for Mood/BH.

## 2024-04-06 NOTE — Assessment & Plan Note (Addendum)
 Currently on Paxil .  Recent increase to 40 mg.  Will plan to continue this at this strength for now.  Additional options are somewhat limited due to breast-feeding still.  She will contact [facility] to set up an appointment for counseling as well.

## 2024-04-17 ENCOUNTER — Encounter: Payer: Self-pay | Admitting: Family Medicine

## 2024-04-17 ENCOUNTER — Ambulatory Visit: Payer: Self-pay

## 2024-04-17 DIAGNOSIS — E1165 Type 2 diabetes mellitus with hyperglycemia: Secondary | ICD-10-CM | POA: Diagnosis not present

## 2024-04-17 DIAGNOSIS — Z20822 Contact with and (suspected) exposure to covid-19: Secondary | ICD-10-CM | POA: Diagnosis not present

## 2024-04-17 DIAGNOSIS — F419 Anxiety disorder, unspecified: Secondary | ICD-10-CM

## 2024-04-17 DIAGNOSIS — B349 Viral infection, unspecified: Secondary | ICD-10-CM | POA: Diagnosis not present

## 2024-04-17 NOTE — Telephone Encounter (Signed)
 FYI Only or Action Required?: Action required by provider: request for appointment.  Patient was last seen in primary care on 04/06/2024 by Alvia Bring, DO.  Called Nurse Triage reporting Blood Sugar Problem.  Symptoms began several days ago.  Interventions attempted: Prescription medications: Metformin  1000mg .  Symptoms are: unchanged.  Triage Disposition: Go to ED Now (or PCP Triage), Call PCP Now  Patient/caregiver understands and will follow disposition?:    Copied from CRM [id number]. Topic: Clinical - Red Word Triage >> Apr 17, 2024  1:09 PM Montie POUR wrote: Red Word that prompted transfer to Nurse Triage:  Her blood sugar was at 367 yesterday; the lowest it has been is 292; She checked 10 minutes ago and it is 340. She wants to know what to do. Reason for Disposition  [1] Blood glucose > 300 mg/dL (83.2 mmol/L) AND [7] two or more times in a row  [1] Blood glucose > 240 mg/dL (86.6 mmol/L) AND [7] vomiting AND [3] unable to check for ketones (in blood or urine)  Answer Assessment - Initial Assessment Questions No available appts today.   Advised ED now. Patient reports will go to [facility] ED.  1. BLOOD GLUCOSE: What is your blood glucose level?      Today 340, last checked 15 min Last ate 0845 AM 2. ONSET: When did you check the blood glucose?     15 minutes ago 3. USUAL RANGE: What is your glucose level usually? (e.g., usual fasting morning value, usual evening value)     Haven't been checking bg and does not check ketones  5. TYPE 1 or 2:  Do you know what type of diabetes you have?  (e.g., Type 1, Type 2, Gestational; doesn't know)      Type ii 6. INSULIN: Do you take insulin? What type of insulin(s) do you use? What is the mode of delivery? (syringe, pen; injection or pump)?      no 7. DIABETES PILLS: Do you take any pills for your diabetes? If Yes, ask: Have you missed taking any pills recently?     Metformin  1000 mg  8. OTHER SYMPTOMS: Do  you have any symptoms? (e.g., fever, frequent urination, difficulty breathing, dizziness, weakness, vomiting)     Dizziness, nausea, sob, frequent urination, increased thirst Denies weakness  Protocols used: Diabetes - High Blood Sugar-A-AH

## 2024-04-17 NOTE — Telephone Encounter (Signed)
 Called [facility], spoke with Tonya, no available appts with pcp and unable to work in.

## 2024-04-20 MED ORDER — PAROXETINE HCL 40 MG PO TABS: 40.0000 mg | ORAL_TABLET | Freq: Every day | ORAL | 1 refills | Status: DC

## 2024-04-20 MED ORDER — METFORMIN HCL ER 500 MG PO TB24: 1000.0000 mg | ORAL_TABLET | Freq: Two times a day (BID) | ORAL | 1 refills | Status: AC

## 2024-04-21 ENCOUNTER — Ambulatory Visit (INDEPENDENT_AMBULATORY_CARE_PROVIDER_SITE_OTHER): Admitting: Physician Assistant

## 2024-04-21 VITALS — BP 134/83 | HR 91 | Ht 69.0 in | Wt 350.0 lb

## 2024-04-21 DIAGNOSIS — E66813 Obesity, class 3: Secondary | ICD-10-CM

## 2024-04-21 DIAGNOSIS — E1165 Type 2 diabetes mellitus with hyperglycemia: Secondary | ICD-10-CM

## 2024-04-21 DIAGNOSIS — Z7984 Long term (current) use of oral hypoglycemic drugs: Secondary | ICD-10-CM

## 2024-04-21 DIAGNOSIS — M1712 Unilateral primary osteoarthritis, left knee: Secondary | ICD-10-CM

## 2024-04-21 DIAGNOSIS — H539 Unspecified visual disturbance: Secondary | ICD-10-CM | POA: Diagnosis not present

## 2024-04-21 DIAGNOSIS — Z6841 Body Mass Index (BMI) 40.0 and over, adult: Secondary | ICD-10-CM

## 2024-04-21 DIAGNOSIS — R42 Dizziness and giddiness: Secondary | ICD-10-CM | POA: Diagnosis not present

## 2024-04-21 DIAGNOSIS — R202 Paresthesia of skin: Secondary | ICD-10-CM | POA: Diagnosis not present

## 2024-04-21 MED ORDER — GLIPIZIDE ER 2.5 MG PO TB24: 2.5000 mg | ORAL_TABLET | Freq: Every day | ORAL | 0 refills | Status: DC

## 2024-04-21 NOTE — Patient Instructions (Signed)
 Start glipizide 2.5mg  in the morning.

## 2024-04-22 ENCOUNTER — Encounter: Payer: Self-pay | Admitting: Physician Assistant

## 2024-04-22 DIAGNOSIS — Z6841 Body Mass Index (BMI) 40.0 and over, adult: Secondary | ICD-10-CM | POA: Insufficient documentation

## 2024-04-22 DIAGNOSIS — R202 Paresthesia of skin: Secondary | ICD-10-CM | POA: Insufficient documentation

## 2024-04-22 DIAGNOSIS — R42 Dizziness and giddiness: Secondary | ICD-10-CM | POA: Insufficient documentation

## 2024-04-22 DIAGNOSIS — H539 Unspecified visual disturbance: Secondary | ICD-10-CM | POA: Insufficient documentation

## 2024-04-22 NOTE — Progress Notes (Signed)
 Established Patient Office Visit  Subjective   Patient ID: Ariana Parks, female    DOB: [birth year]  Age: 43 y.o. MRN: [id number]  Chief Complaint  Patient presents with   Medical Management of Chronic Issues    HPI .SABRADiscussed the use of AI scribe software for clinical note transcription with the patient, who gave verbal consent to proceed.  History of Present Illness Ariana Parks is a 43 year old female with diabetes who presents with elevated blood sugar levels and associated symptoms of her diabetes.   Hyperglycemia and glycemic control - Difficulty managing blood glucose levels with home readings consistently in the 200s and occasionally as high as 340 mg/dL - Blood glucose was better controlled earlier in the year, particularly in March and into the summer, but began to spike around August - Worsening glycemic control attributed to decreased adherence to diabetic diet and increased consumption of sugary foods such as milkshakes and popsicles during the summer - Currently taking metformin  for diabetes management - History of gestational diabetes, previously treated with glyburide during pregnancy - Currently breastfeeding her almost four-year-old daughter, which limits medication options  Neurological and visual symptoms - Blurry vision - Dizziness, including episodes of teetering while in the shower and while walking  Peripheral neuropathy symptoms - Significant pain in the feet described as 'pins and needles' and sharp pains - Foot pain interferes with sleep, keeping her awake until about 3 AM  Physical activity and lifestyle factors - Physical activity decreased after a summer fitness class ended in August, believed to have contributed to worsening blood sugar control  Arthralgia and osteoarthritis - Knee pain due to arthritis - Receives knee injections every three months for arthritis management    ROS See HPI.    Objective:     BP 134/83   Pulse 91    Ht 5' 9 (1.753 m)   Wt (!) 350 lb (158.8 kg)   SpO2 92%   BMI 51.69 kg/m  BP Readings from Last 3 Encounters:  04/21/24 134/83  04/06/24 124/81  03/05/24 123/80   Wt Readings from Last 3 Encounters:  04/21/24 (!) 350 lb (158.8 kg)  04/06/24 (!) 350 lb (158.8 kg)  03/05/24 (!) 350 lb (158.8 kg)      Physical Exam Constitutional:      Appearance: Normal appearance. She is obese.  HENT:     Head: Normocephalic.  Cardiovascular:     Rate and Rhythm: Normal rate and regular rhythm.     Heart sounds: Normal heart sounds.  Pulmonary:     Effort: Pulmonary effort is normal.     Breath sounds: Normal breath sounds.  Musculoskeletal:     Right lower leg: No edema.     Left lower leg: No edema.  Neurological:     General: No focal deficit present.     Mental Status: She is alert and oriented to person, place, and time.  Psychiatric:        Mood and Affect: Mood normal.        The 10-year ASCVD risk score (Arnett DK, et al., 2019) is: 1.6%    Assessment & Plan:  SABRASABRAAlyssah was seen today for medical management of chronic issues.  Diagnoses and all orders for this visit:  Uncontrolled type 2 diabetes mellitus with hyperglycemia (HCC) -     glipiZIDE (GLUCOTROL XL) 2.5 MG 24 hr tablet; Take 1 tablet (2.5 mg total) by mouth daily with breakfast.  Paresthesia of both feet  Vision changes  Primary osteoarthritis of left knee  Class 3 severe obesity due to excess calories with serious comorbidity and body mass index (BMI) of 50.0 to 59.9 in adult [facility])  Dizziness   Assessment & Plan Type 2 diabetes mellitus with hyperglycemia and diabetic neuropathy Last A1c was 7.7 in August with blood glucose now in the 200s. Severe neuropathy symptoms affect sleep. Blood sugar control worsened due to dietary changes and decreased activity. Breastfeeding limits medication options. - Prescribed glipizide with food to avoid hypoglycemia. - Continue metformin . - Provided diabetic  diet and glycemic index chart. - Reassess A1c in one month.  - Continue to monitor BS and keep log.  - Discussed potential transition to GLP-1 agonists that could help with weight loss and diabetes post-breastfeeding. - Encouraged eye exam to follow up on vision changes.   Dizziness Dizziness occurs in the morning and while walking, linked to high blood sugar and possibly neuropathy.  Knee osteoarthritis Significant  left knee pain limits activity. Eligible for knee injection as last was over three months ago. - Continue follow up with ortho for knee injection.    Return in about 4 weeks (around 05/19/2024) for with PCP.    Aarilyn Dye, PA-C

## 2024-05-17 ENCOUNTER — Other Ambulatory Visit: Payer: Self-pay | Admitting: Medical Genetics

## 2024-05-19 ENCOUNTER — Ambulatory Visit: Admitting: Family Medicine

## 2024-05-20 ENCOUNTER — Encounter: Payer: Self-pay | Admitting: Family Medicine

## 2024-05-20 ENCOUNTER — Ambulatory Visit (INDEPENDENT_AMBULATORY_CARE_PROVIDER_SITE_OTHER): Admitting: Family Medicine

## 2024-05-20 DIAGNOSIS — Z7984 Long term (current) use of oral hypoglycemic drugs: Secondary | ICD-10-CM

## 2024-05-20 DIAGNOSIS — E1165 Type 2 diabetes mellitus with hyperglycemia: Secondary | ICD-10-CM

## 2024-05-20 MED ORDER — GLIPIZIDE ER 5 MG PO TB24: 5.0000 mg | ORAL_TABLET | Freq: Two times a day (BID) | ORAL | 1 refills | Status: AC

## 2024-05-20 NOTE — Progress Notes (Signed)
 Ariana Parks - 43 y.o. female MRN [id number]  Date of birth: Oct 31, 1980  Subjective Chief Complaint  Patient presents with   Diabetes    HPI Ariana Parks is 43 y.o. female here today for follow up visit.   She was seen about 4 weeks ago due to hyperglycemia.  She remained on metformin  and glipizide  was added.  Encouraged dietary changes.  She is here today for follow-up.  She reports that blood sugars overall have been better over the past several weeks, however over the past few days has been little more elevated.  She was on vacation recently and had not been as diligent with her diet.  Her medication options have been limited due to continued breast-feeding.  She does remain pretty sedentary.  ROS:  A comprehensive ROS was completed and negative except as noted per HPI  Allergies  Allergen Reactions   Citalopram     Other reaction(s): Bradycardia   Celexa [Citalopram Hydrobromide] Other (See Comments)    Hospitalized due to low heart rate   Ibuprofen  Other (See Comments)    High doses (petechial bruising)   Prednisone  Other (See Comments)    Shaking uncontrollably    Prilosec [Omeprazole ] Other (See Comments)    Hospitalized due to low heart rate   Wellbutrin [Bupropion] Other (See Comments)    Lows seizure threshold    Flexeril [Cyclobenzaprine] Other (See Comments)    Bizarre dreams/exacerbate depression    Past Medical History:  Diagnosis Date   Abnormal Pap smear of cervix 2005   Mod dysplasia   Depression    Endometriosis    GERD (gastroesophageal reflux disease)    Gestational diabetes mellitus (GDM) affecting first pregnancy    High cholesterol    Migraine headache    Nonocclusive mesenteric ischemia 04/04/2018   Dx colonscopy Aug 2019 GAP   OSA (obstructive sleep apnea) 07/05/2016   Panic attack    PCOS (polycystic ovarian syndrome)    Pre-diabetes     Past Surgical History:  Procedure Laterality Date   CESAREAN SECTION N/A 07/24/2020    Procedure: PRIMARY CESAREAN SECTION;  Surgeon: Curlene Agent, MD;  Location: [facility];  Service: Obstetrics;  Laterality: N/A;   KNEE ARTHROSCOPY WITH LATERAL MENISECTOMY Right 06/20/2022   Procedure: KNEE ARTHROSCOPY WITH PARTIAL  LATERAL MENISECTOMY AND CHONDROPLASTY;  Surgeon: Gerome Charleston, MD;  Location: [facility];  Service: Orthopedics;  Laterality: Right;   KNEE SURGERY     LEEP     TONSILLECTOMY     WISDOM TOOTH EXTRACTION      Social History   Socioeconomic History   Marital status: Single    Spouse name: Not on file   Number of children: Not on file   Years of education: Not on file   Highest education level: Some college, no degree  Occupational History   Occupation: academic librarian  Tobacco Use   Smoking status: Former    Current packs/day: 0.00    Average packs/day: 0.5 packs/day for 15.0 years (7.5 ttl pk-yrs)    Types: Cigarettes    Start date: 05/25/2005    Quit date: 05/25/2020    Years since quitting: 4.0   Smokeless tobacco: Never   Tobacco comments:    using vapes  Vaping Use   Vaping status: Never Used  Substance and Sexual Activity   Alcohol use: Not Currently    Alcohol/week: 0.0 standard drinks of alcohol   Drug use: No   Sexual activity: Yes    Partners: Male  Birth control/protection: None  Other Topics Concern   Not on file  Social History Narrative   Not on file   Social Drivers of Health   Financial Resource Strain: Low Risk  (05/17/2024)   Overall Financial Resource Strain (CARDIA)    Difficulty of Paying Living Expenses: Not very hard  Food Insecurity: No Food Insecurity (05/17/2024)   Hunger Vital Sign    Worried About Running Out of Food in the Last Year: Never true    Ran Out of Food in the Last Year: Never true  Transportation Needs: No Transportation Needs (05/17/2024)   PRAPARE - Administrator, Civil Service (Medical): No    Lack of Transportation (Non-Medical): No  Physical Activity: Insufficiently Active  (05/17/2024)   Exercise Vital Sign    Days of Exercise per Week: 2 days    Minutes of Exercise per Session: 20 min  Stress: Stress Concern Present (05/17/2024)   [employer] of Occupational Health - Occupational Stress Questionnaire    Feeling of Stress: Rather much  Social Connections: Socially Isolated (05/17/2024)   Social Connection and Isolation Panel    Frequency of Communication with Friends and Family: Once a week    Frequency of Social Gatherings with Friends and Family: Never    Attends Religious Services: More than 4 times per year    Active Member of [employer] or Organizations: No    Attends Engineer, Structural: Not on file    Marital Status: Never married    Family History  Problem Relation Age of Onset   Hypertension Mother    Thyroid  disease Mother    GER disease Father    Migraines Father    Heart attack Other    Thyroid  disease Brother     Health Maintenance  Topic Date Due   OPHTHALMOLOGY EXAM  Never done   Diabetic kidney evaluation - eGFR measurement  01/29/2024   Diabetic kidney evaluation - Urine ACR  01/29/2024   Hepatitis B Vaccines 19-59 Average Risk (1 of 3 - 19+ 3-dose series) 05/20/2025 (Originally 08/20/2000)   HPV VACCINES (1 - 3-dose SCDM series) 05/20/2025 (Originally 08/20/2008)   COVID-19 Vaccine (2 - 2025-26 season) 06/05/2025 (Originally 02/24/2024)   FOOT EXAM  07/04/2024   HEMOGLOBIN A1C  09/02/2024   Mammogram  09/25/2024   Cervical Cancer Screening (HPV/Pap Cotest)  09/26/2025   Colonoscopy  04/28/2028   DTaP/Tdap/Td (3 - Td or Tdap) 05/03/2030   Pneumococcal Vaccine  Completed   Influenza Vaccine  Completed   Hepatitis C Screening  Completed   HIV Screening  Completed   Meningococcal B Vaccine  Aged Out      ----------------------------------------------------------------------------------------------------------------------------------------------------------------------------------------------------------------- Physical Exam BP 118/82 (BP Location: Left Arm, Patient Position: Sitting, Cuff Size: Large)   Pulse 90   Ht 5' 9 (1.753 m)   Wt (!) 345 lb (156.5 kg)   SpO2 97%   BMI 50.95 kg/m   Physical Exam Constitutional:      Appearance: Normal appearance.  Eyes:     General: No scleral icterus. Cardiovascular:     Rate and Rhythm: Normal rate and regular rhythm.  Pulmonary:     Effort: Pulmonary effort is normal.     Breath sounds: Normal breath sounds.  Neurological:     General: No focal deficit present.     Mental Status: She is alert.  Psychiatric:        Mood and Affect: Mood normal.        Behavior: Behavior normal.     -------------------------------------------------------------------------------------------------------------------------------------------------------------------------------------------------------------------  Assessment and Plan  Uncontrolled type 2 diabetes mellitus with hyperglycemia (HCC) Blood sugars a little elevated over the past few days but overall better than they were a few weeks ago.  Encouraged to continue to work on dietary changes.  Increasing glipizide  to 5 mg twice per day.  There does appear to be some emerging evidence that GLP-1's may be safe with breast-feeding however until this is firm down I discussed with her that I remain hesitant about adding this on.   Meds ordered this encounter  Medications   glipiZIDE  (GLUCOTROL  XL) 5 MG 24 hr tablet    Sig: Take 1 tablet (5 mg total) by mouth in the morning and at bedtime.    Dispense:  180 tablet    Refill:  1    No follow-ups on file.

## 2024-05-24 NOTE — Assessment & Plan Note (Signed)
 Blood sugars a little elevated over the past few days but overall better than they were a few weeks ago.  Encouraged to continue to work on dietary changes.  Increasing glipizide  to 5 mg twice per day.  There does appear to be some emerging evidence that GLP-1's may be safe with breast-feeding however until this is firm down I discussed with Ariana Parks that I remain hesitant about adding this on.

## 2024-06-01 ENCOUNTER — Ambulatory Visit: Admitting: Family Medicine

## 2024-06-01 ENCOUNTER — Encounter: Payer: Self-pay | Admitting: Family Medicine

## 2024-06-01 VITALS — BP 135/87 | HR 80 | Ht 69.0 in | Wt 345.0 lb

## 2024-06-01 DIAGNOSIS — G8929 Other chronic pain: Secondary | ICD-10-CM

## 2024-06-01 DIAGNOSIS — E1165 Type 2 diabetes mellitus with hyperglycemia: Secondary | ICD-10-CM | POA: Diagnosis not present

## 2024-06-01 DIAGNOSIS — F331 Major depressive disorder, recurrent, moderate: Secondary | ICD-10-CM

## 2024-06-01 DIAGNOSIS — M25562 Pain in left knee: Secondary | ICD-10-CM

## 2024-06-01 DIAGNOSIS — F419 Anxiety disorder, unspecified: Secondary | ICD-10-CM

## 2024-06-01 DIAGNOSIS — M25561 Pain in right knee: Secondary | ICD-10-CM | POA: Insufficient documentation

## 2024-06-01 DIAGNOSIS — Z7984 Long term (current) use of oral hypoglycemic drugs: Secondary | ICD-10-CM

## 2024-06-01 LAB — POCT UA - MICROALBUMIN
Albumin/Creatinine Ratio, Urine, POC: <30
Creatinine, POC: 100 mg/dL
Microalbumin Ur, POC: 10 mg/L

## 2024-06-01 MED ORDER — AMOXICILLIN-POT CLAVULANATE 875-125 MG PO TABS: 1.0000 | ORAL_TABLET | Freq: Two times a day (BID) | ORAL | 0 refills | Status: AC

## 2024-06-01 MED ORDER — MELOXICAM 15 MG PO TABS: 15.0000 mg | ORAL_TABLET | Freq: Every day | ORAL | 1 refills | Status: AC

## 2024-06-01 MED ORDER — PAROXETINE HCL 40 MG PO TABS: 60.0000 mg | ORAL_TABLET | Freq: Every day | ORAL | 1 refills | Status: DC

## 2024-06-01 NOTE — Assessment & Plan Note (Signed)
 Increased depression and anxiety.  Increasing paroxetine  to 60mg  daily. Return in 6 weeks.

## 2024-06-01 NOTE — Assessment & Plan Note (Signed)
 Adding meloxicam  prn.  She will plan to follow up with Dr. Melodi as well

## 2024-06-01 NOTE — Addendum Note (Signed)
 Addended by: Pennelope Basque Z on: 06/01/2024 02:50 PM   Modules accepted: Orders

## 2024-06-01 NOTE — Assessment & Plan Note (Signed)
 Blood sugars overall are better.  Continue current medications.Encouraged continued diet and exercise.

## 2024-06-01 NOTE — Progress Notes (Signed)
 Ariana Parks - 43 y.o. female MRN [id number]  Date of birth: 1981/01/10  Subjective Chief Complaint  Patient presents with   Knee Pain   Hypertension   Diabetes    HPI Ariana Parks is a 43 y.o. female here today for follow up visit.   Blood sugars are 150-170. She remains on glipizide  and metformin .  She denies side effects related to medication including hypoglycemia.  Other options have been limited due to continued breast feeding and limited safety data with these.   Feels more depressed and anxious.  She remains on paroxetine  and is tolerating this well. Would like to try to increase of this.   Having increased knee pain.  Seeing Dr. Melodi next month. Currently using ibuprofen  in the evening.   ROS:  A comprehensive ROS was completed and negative except as noted per HPI  Allergies  Allergen Reactions   Citalopram     Other reaction(s): Bradycardia   Celexa [Citalopram Hydrobromide] Other (See Comments)    Hospitalized due to low heart rate   Ibuprofen  Other (See Comments)    High doses (petechial bruising)   Prednisone  Other (See Comments)    Shaking uncontrollably    Prilosec [Omeprazole ] Other (See Comments)    Hospitalized due to low heart rate   Wellbutrin [Bupropion] Other (See Comments)    Lows seizure threshold    Flexeril [Cyclobenzaprine] Other (See Comments)    Bizarre dreams/exacerbate depression    Past Medical History:  Diagnosis Date   Abnormal Pap smear of cervix 2005   Mod dysplasia   Depression    Endometriosis    GERD (gastroesophageal reflux disease)    Gestational diabetes mellitus (GDM) affecting first pregnancy    High cholesterol    Migraine headache    Nonocclusive mesenteric ischemia 04/04/2018   Dx colonscopy Aug 2019 GAP   OSA (obstructive sleep apnea) 07/05/2016   Panic attack    PCOS (polycystic ovarian syndrome)    Pre-diabetes     Past Surgical History:  Procedure Laterality Date   CESAREAN SECTION N/A 07/24/2020    Procedure: PRIMARY CESAREAN SECTION;  Surgeon: Curlene Agent, MD;  Location: [facility];  Service: Obstetrics;  Laterality: N/A;   KNEE ARTHROSCOPY WITH LATERAL MENISECTOMY Right 06/20/2022   Procedure: KNEE ARTHROSCOPY WITH PARTIAL  LATERAL MENISECTOMY AND CHONDROPLASTY;  Surgeon: Gerome Charleston, MD;  Location: [facility];  Service: Orthopedics;  Laterality: Right;   KNEE SURGERY     LEEP     TONSILLECTOMY     WISDOM TOOTH EXTRACTION      Social History   Socioeconomic History   Marital status: Single    Spouse name: Not on file   Number of children: Not on file   Years of education: Not on file   Highest education level: Some college, no degree  Occupational History   Occupation: academic librarian  Tobacco Use   Smoking status: Former    Current packs/day: 0.00    Average packs/day: 0.5 packs/day for 15.0 years (7.5 ttl pk-yrs)    Types: Cigarettes    Start date: 05/25/2005    Quit date: 05/25/2020    Years since quitting: 4.0   Smokeless tobacco: Never   Tobacco comments:    using vapes  Vaping Use   Vaping status: Never Used  Substance and Sexual Activity   Alcohol use: Not Currently    Alcohol/week: 0.0 standard drinks of alcohol   Drug use: No   Sexual activity: Yes    Partners: Male  Birth control/protection: None  Other Topics Concern   Not on file  Social History Narrative   Not on file   Social Drivers of Health   Financial Resource Strain: Low Risk  (05/17/2024)   Overall Financial Resource Strain (CARDIA)    Difficulty of Paying Living Expenses: Not very hard  Food Insecurity: No Food Insecurity (05/17/2024)   Hunger Vital Sign    Worried About Running Out of Food in the Last Year: Never true    Ran Out of Food in the Last Year: Never true  Transportation Needs: No Transportation Needs (05/17/2024)   PRAPARE - Administrator, Civil Service (Medical): No    Lack of Transportation (Non-Medical): No  Physical Activity: Insufficiently Active  (05/17/2024)   Exercise Vital Sign    Days of Exercise per Week: 2 days    Minutes of Exercise per Session: 20 min  Stress: Stress Concern Present (05/17/2024)   [employer] of Occupational Health - Occupational Stress Questionnaire    Feeling of Stress: Rather much  Social Connections: Socially Isolated (05/17/2024)   Social Connection and Isolation Panel    Frequency of Communication with Friends and Family: Once a week    Frequency of Social Gatherings with Friends and Family: Never    Attends Religious Services: More than 4 times per year    Active Member of [employer] or Organizations: No    Attends Engineer, Structural: Not on file    Marital Status: Never married    Family History  Problem Relation Age of Onset   Hypertension Mother    Thyroid  disease Mother    GER disease Father    Migraines Father    Heart attack Other    Thyroid  disease Brother     Health Maintenance  Topic Date Due   OPHTHALMOLOGY EXAM  Never done   Diabetic kidney evaluation - eGFR measurement  01/29/2024   Diabetic kidney evaluation - Urine ACR  01/29/2024   Hepatitis B Vaccines 19-59 Average Risk (1 of 3 - 19+ 3-dose series) 05/20/2025 (Originally 08/20/2000)   HPV VACCINES (1 - 3-dose SCDM series) 05/20/2025 (Originally 08/20/2008)   COVID-19 Vaccine (2 - 2025-26 season) 06/05/2025 (Originally 02/24/2024)   FOOT EXAM  07/04/2024   HEMOGLOBIN A1C  09/02/2024   Mammogram  09/25/2024   Cervical Cancer Screening (HPV/Pap Cotest)  09/26/2025   Colonoscopy  04/28/2028   DTaP/Tdap/Td (3 - Td or Tdap) 05/03/2030   Pneumococcal Vaccine  Completed   Influenza Vaccine  Completed   Hepatitis C Screening  Completed   HIV Screening  Completed   Meningococcal B Vaccine  Aged Out      ----------------------------------------------------------------------------------------------------------------------------------------------------------------------------------------------------------------- Physical Exam BP 135/87 (BP Location: Left Arm, Patient Position: Sitting, Cuff Size: Large)   Pulse 80   Ht 5' 9 (1.753 m)   Wt (!) 345 lb (156.5 kg)   SpO2 97%   BMI 50.95 kg/m   Physical Exam Constitutional:      Appearance: Normal appearance.  Eyes:     General: No scleral icterus. Cardiovascular:     Rate and Rhythm: Normal rate and regular rhythm.  Pulmonary:     Effort: Pulmonary effort is normal.     Breath sounds: Normal breath sounds.  Neurological:     Mental Status: She is alert.  Psychiatric:        Mood and Affect: Mood normal.        Behavior: Behavior normal.     ------------------------------------------------------------------------------------------------------------------------------------------------------------------------------------------------------------------- Assessment and Plan  MDD (major depressive disorder) Increased depression and anxiety.  Increasing paroxetine  to 60mg  daily. Return in 6 weeks.   Uncontrolled type 2 diabetes mellitus with hyperglycemia (HCC) Blood sugars overall are better.  Continue current medications.Encouraged continued diet and exercise.    Bilateral knee pain Adding meloxicam  prn.  She will plan to follow up with Dr. Aluisio as well    Meds ordered this encounter  Medications   PARoxetine  (PAXIL ) 40 MG tablet    Sig: Take 1.5 tablets (60 mg total) by mouth daily.    Dispense:  135 tablet    Refill:  1   meloxicam  (MOBIC ) 15 MG tablet    Sig: Take 1 tablet (15 mg total) by mouth daily.    Dispense:  30 tablet    Refill:  1   amoxicillin -clavulanate (AUGMENTIN ) 875-125 MG tablet    Sig: Take 1 tablet by mouth 2 (two) times daily.    Dispense:  20 tablet    Refill:  0    Return in about 6  weeks (around 07/13/2024) for Mood/BH, Type 2 Diabetes.

## 2024-06-02 MED ORDER — PAROXETINE HCL 20 MG PO TABS: 60.0000 mg | ORAL_TABLET | Freq: Every day | ORAL | 2 refills | Status: DC

## 2024-06-26 ENCOUNTER — Other Ambulatory Visit: Payer: Self-pay | Admitting: Family Medicine

## 2024-07-05 ENCOUNTER — Other Ambulatory Visit: Payer: Self-pay | Admitting: Family Medicine

## 2024-07-12 ENCOUNTER — Other Ambulatory Visit: Payer: Self-pay | Admitting: Physician Assistant

## 2024-07-12 DIAGNOSIS — E1165 Type 2 diabetes mellitus with hyperglycemia: Secondary | ICD-10-CM

## 2024-07-14 ENCOUNTER — Ambulatory Visit: Admitting: Family Medicine

## 2024-07-23 ENCOUNTER — Other Ambulatory Visit (HOSPITAL_COMMUNITY)
